# Patient Record
Sex: Female | Born: 1935
Health system: Southern US, Community
[De-identification: ages and names within clinical notes are randomized; demographics above are authoritative.]

## PROBLEM LIST (undated history)

## (undated) DIAGNOSIS — I509 Heart failure, unspecified: Secondary | ICD-10-CM

## (undated) DIAGNOSIS — E785 Hyperlipidemia, unspecified: Secondary | ICD-10-CM

## (undated) DIAGNOSIS — I639 Cerebral infarction, unspecified: Secondary | ICD-10-CM

## (undated) DIAGNOSIS — I4891 Unspecified atrial fibrillation: Secondary | ICD-10-CM

## (undated) DIAGNOSIS — I10 Essential (primary) hypertension: Secondary | ICD-10-CM

## (undated) HISTORY — DX: Unspecified atrial fibrillation: I48.91

## (undated) HISTORY — DX: Cerebral infarction, unspecified: I63.9

## (undated) HISTORY — PX: BLADDER SURGERY: SHX569

## (undated) HISTORY — DX: Essential (primary) hypertension: I10

## (undated) HISTORY — PX: THYROID SURGERY: SHX805

## (undated) HISTORY — DX: Hyperlipidemia, unspecified: E78.5

## (undated) HISTORY — PX: ABDOMINAL HYSTERECTOMY: SHX81

## (undated) HISTORY — DX: Heart failure, unspecified: I50.9

## (undated) HISTORY — PX: EYE SURGERY: SHX253

---

## 1998-08-31 ENCOUNTER — Ambulatory Visit (HOSPITAL_COMMUNITY): Admission: RE | Admit: 1998-08-31 | Discharge: 1998-08-31 | Payer: Self-pay | Admitting: Endocrinology

## 1998-09-04 ENCOUNTER — Ambulatory Visit (HOSPITAL_COMMUNITY): Admission: RE | Admit: 1998-09-04 | Discharge: 1998-09-04 | Payer: Self-pay | Admitting: Endocrinology

## 2004-12-07 ENCOUNTER — Encounter: Admission: RE | Admit: 2004-12-07 | Discharge: 2004-12-07 | Payer: Self-pay | Admitting: Orthopedic Surgery

## 2011-12-26 DIAGNOSIS — R350 Frequency of micturition: Secondary | ICD-10-CM | POA: Diagnosis not present

## 2011-12-26 DIAGNOSIS — N3020 Other chronic cystitis without hematuria: Secondary | ICD-10-CM | POA: Diagnosis not present

## 2011-12-26 DIAGNOSIS — R3129 Other microscopic hematuria: Secondary | ICD-10-CM | POA: Diagnosis not present

## 2011-12-26 DIAGNOSIS — N35919 Unspecified urethral stricture, male, unspecified site: Secondary | ICD-10-CM | POA: Diagnosis not present

## 2011-12-26 DIAGNOSIS — R339 Retention of urine, unspecified: Secondary | ICD-10-CM | POA: Diagnosis not present

## 2012-01-13 DIAGNOSIS — Z1231 Encounter for screening mammogram for malignant neoplasm of breast: Secondary | ICD-10-CM | POA: Diagnosis not present

## 2012-01-19 DIAGNOSIS — N63 Unspecified lump in unspecified breast: Secondary | ICD-10-CM | POA: Diagnosis not present

## 2012-02-27 DIAGNOSIS — I4891 Unspecified atrial fibrillation: Secondary | ICD-10-CM | POA: Diagnosis not present

## 2012-02-27 DIAGNOSIS — Z7901 Long term (current) use of anticoagulants: Secondary | ICD-10-CM | POA: Diagnosis not present

## 2012-02-27 DIAGNOSIS — I10 Essential (primary) hypertension: Secondary | ICD-10-CM | POA: Diagnosis not present

## 2012-02-28 DIAGNOSIS — Z79899 Other long term (current) drug therapy: Secondary | ICD-10-CM | POA: Diagnosis not present

## 2012-02-28 DIAGNOSIS — E785 Hyperlipidemia, unspecified: Secondary | ICD-10-CM | POA: Diagnosis not present

## 2012-02-28 DIAGNOSIS — IMO0002 Reserved for concepts with insufficient information to code with codable children: Secondary | ICD-10-CM | POA: Diagnosis not present

## 2012-02-28 DIAGNOSIS — I10 Essential (primary) hypertension: Secondary | ICD-10-CM | POA: Diagnosis not present

## 2012-02-28 DIAGNOSIS — E039 Hypothyroidism, unspecified: Secondary | ICD-10-CM | POA: Diagnosis not present

## 2012-02-28 DIAGNOSIS — I4891 Unspecified atrial fibrillation: Secondary | ICD-10-CM | POA: Diagnosis not present

## 2012-03-27 DIAGNOSIS — R350 Frequency of micturition: Secondary | ICD-10-CM | POA: Diagnosis not present

## 2012-03-27 DIAGNOSIS — R339 Retention of urine, unspecified: Secondary | ICD-10-CM | POA: Diagnosis not present

## 2012-03-27 DIAGNOSIS — N35919 Unspecified urethral stricture, male, unspecified site: Secondary | ICD-10-CM | POA: Diagnosis not present

## 2012-03-27 DIAGNOSIS — R3129 Other microscopic hematuria: Secondary | ICD-10-CM | POA: Diagnosis not present

## 2012-03-27 DIAGNOSIS — N3020 Other chronic cystitis without hematuria: Secondary | ICD-10-CM | POA: Diagnosis not present

## 2012-05-07 DIAGNOSIS — E0789 Other specified disorders of thyroid: Secondary | ICD-10-CM | POA: Diagnosis not present

## 2012-05-10 DIAGNOSIS — M79609 Pain in unspecified limb: Secondary | ICD-10-CM | POA: Diagnosis not present

## 2012-05-10 DIAGNOSIS — B351 Tinea unguium: Secondary | ICD-10-CM | POA: Diagnosis not present

## 2012-05-12 DIAGNOSIS — L2089 Other atopic dermatitis: Secondary | ICD-10-CM | POA: Diagnosis not present

## 2012-05-14 DIAGNOSIS — F411 Generalized anxiety disorder: Secondary | ICD-10-CM | POA: Diagnosis not present

## 2012-05-14 DIAGNOSIS — E0789 Other specified disorders of thyroid: Secondary | ICD-10-CM | POA: Diagnosis not present

## 2012-05-14 DIAGNOSIS — I10 Essential (primary) hypertension: Secondary | ICD-10-CM | POA: Diagnosis not present

## 2012-06-06 DIAGNOSIS — I4891 Unspecified atrial fibrillation: Secondary | ICD-10-CM | POA: Diagnosis not present

## 2012-06-06 DIAGNOSIS — E785 Hyperlipidemia, unspecified: Secondary | ICD-10-CM | POA: Diagnosis not present

## 2012-06-06 DIAGNOSIS — I10 Essential (primary) hypertension: Secondary | ICD-10-CM | POA: Diagnosis not present

## 2012-06-06 DIAGNOSIS — IMO0002 Reserved for concepts with insufficient information to code with codable children: Secondary | ICD-10-CM | POA: Diagnosis not present

## 2012-06-26 DIAGNOSIS — N35919 Unspecified urethral stricture, male, unspecified site: Secondary | ICD-10-CM | POA: Diagnosis not present

## 2012-06-26 DIAGNOSIS — R350 Frequency of micturition: Secondary | ICD-10-CM | POA: Diagnosis not present

## 2012-06-26 DIAGNOSIS — R339 Retention of urine, unspecified: Secondary | ICD-10-CM | POA: Diagnosis not present

## 2012-06-26 DIAGNOSIS — R3129 Other microscopic hematuria: Secondary | ICD-10-CM | POA: Diagnosis not present

## 2012-07-16 DIAGNOSIS — H52229 Regular astigmatism, unspecified eye: Secondary | ICD-10-CM | POA: Diagnosis not present

## 2012-07-16 DIAGNOSIS — IMO0002 Reserved for concepts with insufficient information to code with codable children: Secondary | ICD-10-CM | POA: Diagnosis not present

## 2012-07-16 DIAGNOSIS — H43399 Other vitreous opacities, unspecified eye: Secondary | ICD-10-CM | POA: Diagnosis not present

## 2012-07-16 DIAGNOSIS — H524 Presbyopia: Secondary | ICD-10-CM | POA: Diagnosis not present

## 2012-08-22 DIAGNOSIS — Z7901 Long term (current) use of anticoagulants: Secondary | ICD-10-CM | POA: Diagnosis not present

## 2012-08-22 DIAGNOSIS — I4891 Unspecified atrial fibrillation: Secondary | ICD-10-CM | POA: Diagnosis not present

## 2012-08-22 DIAGNOSIS — E890 Postprocedural hypothyroidism: Secondary | ICD-10-CM | POA: Diagnosis not present

## 2012-08-22 DIAGNOSIS — I10 Essential (primary) hypertension: Secondary | ICD-10-CM | POA: Diagnosis not present

## 2012-08-23 DIAGNOSIS — IMO0002 Reserved for concepts with insufficient information to code with codable children: Secondary | ICD-10-CM | POA: Diagnosis not present

## 2012-08-23 DIAGNOSIS — I10 Essential (primary) hypertension: Secondary | ICD-10-CM | POA: Diagnosis not present

## 2012-08-23 DIAGNOSIS — Z0000 Encounter for general adult medical examination without abnormal findings: Secondary | ICD-10-CM | POA: Diagnosis not present

## 2012-08-23 DIAGNOSIS — Z79899 Other long term (current) drug therapy: Secondary | ICD-10-CM | POA: Diagnosis not present

## 2012-08-23 DIAGNOSIS — Z23 Encounter for immunization: Secondary | ICD-10-CM | POA: Diagnosis not present

## 2012-08-23 DIAGNOSIS — E785 Hyperlipidemia, unspecified: Secondary | ICD-10-CM | POA: Diagnosis not present

## 2012-08-29 DIAGNOSIS — M810 Age-related osteoporosis without current pathological fracture: Secondary | ICD-10-CM | POA: Diagnosis not present

## 2012-09-19 DIAGNOSIS — M810 Age-related osteoporosis without current pathological fracture: Secondary | ICD-10-CM | POA: Diagnosis not present

## 2012-09-24 DIAGNOSIS — R3129 Other microscopic hematuria: Secondary | ICD-10-CM | POA: Diagnosis not present

## 2012-09-24 DIAGNOSIS — N35919 Unspecified urethral stricture, male, unspecified site: Secondary | ICD-10-CM | POA: Diagnosis not present

## 2012-09-24 DIAGNOSIS — R339 Retention of urine, unspecified: Secondary | ICD-10-CM | POA: Diagnosis not present

## 2012-09-26 DIAGNOSIS — Z23 Encounter for immunization: Secondary | ICD-10-CM | POA: Diagnosis not present

## 2012-10-02 DIAGNOSIS — N3020 Other chronic cystitis without hematuria: Secondary | ICD-10-CM | POA: Diagnosis not present

## 2012-10-02 DIAGNOSIS — N200 Calculus of kidney: Secondary | ICD-10-CM | POA: Diagnosis not present

## 2012-10-02 DIAGNOSIS — R3915 Urgency of urination: Secondary | ICD-10-CM | POA: Diagnosis not present

## 2012-10-02 DIAGNOSIS — R3989 Other symptoms and signs involving the genitourinary system: Secondary | ICD-10-CM | POA: Diagnosis not present

## 2012-10-02 DIAGNOSIS — N390 Urinary tract infection, site not specified: Secondary | ICD-10-CM | POA: Diagnosis not present

## 2012-10-02 DIAGNOSIS — N35919 Unspecified urethral stricture, male, unspecified site: Secondary | ICD-10-CM | POA: Diagnosis not present

## 2012-10-02 DIAGNOSIS — R3129 Other microscopic hematuria: Secondary | ICD-10-CM | POA: Diagnosis not present

## 2012-10-04 DIAGNOSIS — R339 Retention of urine, unspecified: Secondary | ICD-10-CM | POA: Diagnosis not present

## 2012-10-04 DIAGNOSIS — N3020 Other chronic cystitis without hematuria: Secondary | ICD-10-CM | POA: Diagnosis not present

## 2012-10-04 DIAGNOSIS — N35919 Unspecified urethral stricture, male, unspecified site: Secondary | ICD-10-CM | POA: Diagnosis not present

## 2012-10-04 DIAGNOSIS — R3915 Urgency of urination: Secondary | ICD-10-CM | POA: Diagnosis not present

## 2012-10-04 DIAGNOSIS — N949 Unspecified condition associated with female genital organs and menstrual cycle: Secondary | ICD-10-CM | POA: Diagnosis not present

## 2012-10-04 DIAGNOSIS — R3129 Other microscopic hematuria: Secondary | ICD-10-CM | POA: Diagnosis not present

## 2012-10-04 DIAGNOSIS — D3000 Benign neoplasm of unspecified kidney: Secondary | ICD-10-CM | POA: Diagnosis not present

## 2012-10-07 DIAGNOSIS — I4891 Unspecified atrial fibrillation: Secondary | ICD-10-CM | POA: Diagnosis not present

## 2012-10-07 DIAGNOSIS — K649 Unspecified hemorrhoids: Secondary | ICD-10-CM | POA: Diagnosis not present

## 2012-10-07 DIAGNOSIS — K922 Gastrointestinal hemorrhage, unspecified: Secondary | ICD-10-CM | POA: Diagnosis not present

## 2012-10-07 DIAGNOSIS — K625 Hemorrhage of anus and rectum: Secondary | ICD-10-CM | POA: Diagnosis not present

## 2012-10-09 DIAGNOSIS — K649 Unspecified hemorrhoids: Secondary | ICD-10-CM | POA: Diagnosis not present

## 2012-10-09 DIAGNOSIS — K921 Melena: Secondary | ICD-10-CM | POA: Diagnosis not present

## 2012-10-09 DIAGNOSIS — R3129 Other microscopic hematuria: Secondary | ICD-10-CM | POA: Diagnosis not present

## 2012-10-09 DIAGNOSIS — R339 Retention of urine, unspecified: Secondary | ICD-10-CM | POA: Diagnosis not present

## 2012-10-09 DIAGNOSIS — I4891 Unspecified atrial fibrillation: Secondary | ICD-10-CM | POA: Diagnosis not present

## 2012-10-09 DIAGNOSIS — K922 Gastrointestinal hemorrhage, unspecified: Secondary | ICD-10-CM | POA: Diagnosis not present

## 2012-10-09 DIAGNOSIS — N3020 Other chronic cystitis without hematuria: Secondary | ICD-10-CM | POA: Diagnosis not present

## 2012-10-10 DIAGNOSIS — K922 Gastrointestinal hemorrhage, unspecified: Secondary | ICD-10-CM | POA: Diagnosis not present

## 2012-10-12 DIAGNOSIS — L270 Generalized skin eruption due to drugs and medicaments taken internally: Secondary | ICD-10-CM | POA: Diagnosis not present

## 2012-10-12 DIAGNOSIS — IMO0002 Reserved for concepts with insufficient information to code with codable children: Secondary | ICD-10-CM | POA: Diagnosis not present

## 2012-10-18 DIAGNOSIS — K5730 Diverticulosis of large intestine without perforation or abscess without bleeding: Secondary | ICD-10-CM | POA: Diagnosis not present

## 2012-10-18 DIAGNOSIS — K921 Melena: Secondary | ICD-10-CM | POA: Diagnosis not present

## 2012-11-05 DIAGNOSIS — R3129 Other microscopic hematuria: Secondary | ICD-10-CM | POA: Diagnosis not present

## 2012-11-05 DIAGNOSIS — N35919 Unspecified urethral stricture, male, unspecified site: Secondary | ICD-10-CM | POA: Diagnosis not present

## 2012-11-05 DIAGNOSIS — N3020 Other chronic cystitis without hematuria: Secondary | ICD-10-CM | POA: Diagnosis not present

## 2012-11-05 DIAGNOSIS — R3915 Urgency of urination: Secondary | ICD-10-CM | POA: Diagnosis not present

## 2012-11-05 DIAGNOSIS — R339 Retention of urine, unspecified: Secondary | ICD-10-CM | POA: Diagnosis not present

## 2012-11-05 DIAGNOSIS — R350 Frequency of micturition: Secondary | ICD-10-CM | POA: Diagnosis not present

## 2012-11-06 DIAGNOSIS — E0789 Other specified disorders of thyroid: Secondary | ICD-10-CM | POA: Diagnosis not present

## 2012-11-14 DIAGNOSIS — E0789 Other specified disorders of thyroid: Secondary | ICD-10-CM | POA: Diagnosis not present

## 2012-11-14 DIAGNOSIS — M810 Age-related osteoporosis without current pathological fracture: Secondary | ICD-10-CM | POA: Diagnosis not present

## 2012-11-14 DIAGNOSIS — I4891 Unspecified atrial fibrillation: Secondary | ICD-10-CM | POA: Diagnosis not present

## 2012-11-22 DIAGNOSIS — R3129 Other microscopic hematuria: Secondary | ICD-10-CM | POA: Diagnosis not present

## 2012-11-22 DIAGNOSIS — N3020 Other chronic cystitis without hematuria: Secondary | ICD-10-CM | POA: Diagnosis not present

## 2012-11-22 DIAGNOSIS — R339 Retention of urine, unspecified: Secondary | ICD-10-CM | POA: Diagnosis not present

## 2012-12-24 DIAGNOSIS — R339 Retention of urine, unspecified: Secondary | ICD-10-CM | POA: Diagnosis not present

## 2012-12-24 DIAGNOSIS — R3915 Urgency of urination: Secondary | ICD-10-CM | POA: Diagnosis not present

## 2012-12-24 DIAGNOSIS — N35919 Unspecified urethral stricture, male, unspecified site: Secondary | ICD-10-CM | POA: Diagnosis not present

## 2012-12-24 DIAGNOSIS — R3129 Other microscopic hematuria: Secondary | ICD-10-CM | POA: Diagnosis not present

## 2013-01-24 DIAGNOSIS — R339 Retention of urine, unspecified: Secondary | ICD-10-CM | POA: Diagnosis not present

## 2013-01-24 DIAGNOSIS — N35919 Unspecified urethral stricture, male, unspecified site: Secondary | ICD-10-CM | POA: Diagnosis not present

## 2013-01-24 DIAGNOSIS — R3915 Urgency of urination: Secondary | ICD-10-CM | POA: Diagnosis not present

## 2013-01-24 DIAGNOSIS — N3020 Other chronic cystitis without hematuria: Secondary | ICD-10-CM | POA: Diagnosis not present

## 2013-01-24 DIAGNOSIS — R3129 Other microscopic hematuria: Secondary | ICD-10-CM | POA: Diagnosis not present

## 2013-01-29 DIAGNOSIS — Z1231 Encounter for screening mammogram for malignant neoplasm of breast: Secondary | ICD-10-CM | POA: Diagnosis not present

## 2013-02-26 DIAGNOSIS — I4891 Unspecified atrial fibrillation: Secondary | ICD-10-CM | POA: Diagnosis not present

## 2013-02-26 DIAGNOSIS — I10 Essential (primary) hypertension: Secondary | ICD-10-CM | POA: Diagnosis not present

## 2013-02-28 DIAGNOSIS — I4891 Unspecified atrial fibrillation: Secondary | ICD-10-CM | POA: Diagnosis not present

## 2013-02-28 DIAGNOSIS — Z79899 Other long term (current) drug therapy: Secondary | ICD-10-CM | POA: Diagnosis not present

## 2013-02-28 DIAGNOSIS — IMO0002 Reserved for concepts with insufficient information to code with codable children: Secondary | ICD-10-CM | POA: Diagnosis not present

## 2013-02-28 DIAGNOSIS — E785 Hyperlipidemia, unspecified: Secondary | ICD-10-CM | POA: Diagnosis not present

## 2013-02-28 DIAGNOSIS — I10 Essential (primary) hypertension: Secondary | ICD-10-CM | POA: Diagnosis not present

## 2013-03-20 DIAGNOSIS — M810 Age-related osteoporosis without current pathological fracture: Secondary | ICD-10-CM | POA: Diagnosis not present

## 2013-03-28 DIAGNOSIS — R3129 Other microscopic hematuria: Secondary | ICD-10-CM | POA: Diagnosis not present

## 2013-03-28 DIAGNOSIS — R3915 Urgency of urination: Secondary | ICD-10-CM | POA: Diagnosis not present

## 2013-03-28 DIAGNOSIS — N35919 Unspecified urethral stricture, male, unspecified site: Secondary | ICD-10-CM | POA: Diagnosis not present

## 2013-03-28 DIAGNOSIS — N3020 Other chronic cystitis without hematuria: Secondary | ICD-10-CM | POA: Diagnosis not present

## 2013-05-09 DIAGNOSIS — E785 Hyperlipidemia, unspecified: Secondary | ICD-10-CM | POA: Diagnosis not present

## 2013-05-13 DIAGNOSIS — E0789 Other specified disorders of thyroid: Secondary | ICD-10-CM | POA: Diagnosis not present

## 2013-05-20 DIAGNOSIS — I4891 Unspecified atrial fibrillation: Secondary | ICD-10-CM | POA: Diagnosis not present

## 2013-05-20 DIAGNOSIS — I10 Essential (primary) hypertension: Secondary | ICD-10-CM | POA: Diagnosis not present

## 2013-05-20 DIAGNOSIS — E0789 Other specified disorders of thyroid: Secondary | ICD-10-CM | POA: Diagnosis not present

## 2013-05-30 DIAGNOSIS — N35919 Unspecified urethral stricture, male, unspecified site: Secondary | ICD-10-CM | POA: Diagnosis not present

## 2013-07-29 DIAGNOSIS — M19039 Primary osteoarthritis, unspecified wrist: Secondary | ICD-10-CM | POA: Diagnosis not present

## 2013-07-30 DIAGNOSIS — R3129 Other microscopic hematuria: Secondary | ICD-10-CM | POA: Diagnosis not present

## 2013-07-30 DIAGNOSIS — N35919 Unspecified urethral stricture, male, unspecified site: Secondary | ICD-10-CM | POA: Diagnosis not present

## 2013-07-30 DIAGNOSIS — N3020 Other chronic cystitis without hematuria: Secondary | ICD-10-CM | POA: Diagnosis not present

## 2013-07-30 DIAGNOSIS — R3915 Urgency of urination: Secondary | ICD-10-CM | POA: Diagnosis not present

## 2013-07-30 DIAGNOSIS — R339 Retention of urine, unspecified: Secondary | ICD-10-CM | POA: Diagnosis not present

## 2013-08-13 DIAGNOSIS — I4891 Unspecified atrial fibrillation: Secondary | ICD-10-CM | POA: Diagnosis not present

## 2013-08-13 DIAGNOSIS — I10 Essential (primary) hypertension: Secondary | ICD-10-CM | POA: Diagnosis not present

## 2013-09-05 DIAGNOSIS — Z23 Encounter for immunization: Secondary | ICD-10-CM | POA: Diagnosis not present

## 2013-09-05 DIAGNOSIS — I10 Essential (primary) hypertension: Secondary | ICD-10-CM | POA: Diagnosis not present

## 2013-09-05 DIAGNOSIS — E785 Hyperlipidemia, unspecified: Secondary | ICD-10-CM | POA: Diagnosis not present

## 2013-09-05 DIAGNOSIS — I4891 Unspecified atrial fibrillation: Secondary | ICD-10-CM | POA: Diagnosis not present

## 2013-09-05 DIAGNOSIS — IMO0002 Reserved for concepts with insufficient information to code with codable children: Secondary | ICD-10-CM | POA: Diagnosis not present

## 2013-09-05 DIAGNOSIS — Z79899 Other long term (current) drug therapy: Secondary | ICD-10-CM | POA: Diagnosis not present

## 2013-09-19 DIAGNOSIS — H259 Unspecified age-related cataract: Secondary | ICD-10-CM | POA: Diagnosis not present

## 2013-09-19 DIAGNOSIS — I10 Essential (primary) hypertension: Secondary | ICD-10-CM | POA: Diagnosis not present

## 2013-09-23 DIAGNOSIS — M810 Age-related osteoporosis without current pathological fracture: Secondary | ICD-10-CM | POA: Diagnosis not present

## 2013-09-30 DIAGNOSIS — R3915 Urgency of urination: Secondary | ICD-10-CM | POA: Diagnosis not present

## 2013-09-30 DIAGNOSIS — N35919 Unspecified urethral stricture, male, unspecified site: Secondary | ICD-10-CM | POA: Diagnosis not present

## 2013-09-30 DIAGNOSIS — R339 Retention of urine, unspecified: Secondary | ICD-10-CM | POA: Diagnosis not present

## 2013-09-30 DIAGNOSIS — R350 Frequency of micturition: Secondary | ICD-10-CM | POA: Diagnosis not present

## 2013-11-12 DIAGNOSIS — E0789 Other specified disorders of thyroid: Secondary | ICD-10-CM | POA: Diagnosis not present

## 2013-11-19 DIAGNOSIS — I10 Essential (primary) hypertension: Secondary | ICD-10-CM | POA: Diagnosis not present

## 2013-11-19 DIAGNOSIS — I4891 Unspecified atrial fibrillation: Secondary | ICD-10-CM | POA: Diagnosis not present

## 2013-11-26 DIAGNOSIS — R350 Frequency of micturition: Secondary | ICD-10-CM | POA: Diagnosis not present

## 2013-11-26 DIAGNOSIS — N35919 Unspecified urethral stricture, male, unspecified site: Secondary | ICD-10-CM | POA: Diagnosis not present

## 2013-11-26 DIAGNOSIS — N3020 Other chronic cystitis without hematuria: Secondary | ICD-10-CM | POA: Diagnosis not present

## 2013-11-26 DIAGNOSIS — R339 Retention of urine, unspecified: Secondary | ICD-10-CM | POA: Diagnosis not present

## 2013-12-04 DIAGNOSIS — R6889 Other general symptoms and signs: Secondary | ICD-10-CM | POA: Diagnosis not present

## 2013-12-04 DIAGNOSIS — H6690 Otitis media, unspecified, unspecified ear: Secondary | ICD-10-CM | POA: Diagnosis not present

## 2013-12-13 DIAGNOSIS — IMO0002 Reserved for concepts with insufficient information to code with codable children: Secondary | ICD-10-CM | POA: Diagnosis not present

## 2013-12-13 DIAGNOSIS — J209 Acute bronchitis, unspecified: Secondary | ICD-10-CM | POA: Diagnosis not present

## 2013-12-13 DIAGNOSIS — E039 Hypothyroidism, unspecified: Secondary | ICD-10-CM | POA: Diagnosis not present

## 2013-12-13 DIAGNOSIS — I10 Essential (primary) hypertension: Secondary | ICD-10-CM | POA: Diagnosis not present

## 2014-01-27 DIAGNOSIS — N35919 Unspecified urethral stricture, male, unspecified site: Secondary | ICD-10-CM | POA: Diagnosis not present

## 2014-01-27 DIAGNOSIS — R339 Retention of urine, unspecified: Secondary | ICD-10-CM | POA: Diagnosis not present

## 2014-01-27 DIAGNOSIS — R3915 Urgency of urination: Secondary | ICD-10-CM | POA: Diagnosis not present

## 2014-01-27 DIAGNOSIS — R350 Frequency of micturition: Secondary | ICD-10-CM | POA: Diagnosis not present

## 2014-01-27 DIAGNOSIS — R3129 Other microscopic hematuria: Secondary | ICD-10-CM | POA: Diagnosis not present

## 2014-02-06 DIAGNOSIS — Z1231 Encounter for screening mammogram for malignant neoplasm of breast: Secondary | ICD-10-CM | POA: Diagnosis not present

## 2014-02-27 ENCOUNTER — Ambulatory Visit (INDEPENDENT_AMBULATORY_CARE_PROVIDER_SITE_OTHER): Payer: Medicare Other

## 2014-02-27 VITALS — BP 137/86 | HR 74 | Resp 16 | Ht 65.0 in | Wt 147.0 lb

## 2014-02-27 DIAGNOSIS — M79609 Pain in unspecified limb: Secondary | ICD-10-CM | POA: Diagnosis not present

## 2014-02-27 DIAGNOSIS — L600 Ingrowing nail: Secondary | ICD-10-CM | POA: Diagnosis not present

## 2014-02-27 DIAGNOSIS — B351 Tinea unguium: Secondary | ICD-10-CM

## 2014-02-27 MED ORDER — TAVABOROLE 5 % EX SOLN: CUTANEOUS | Status: DC

## 2014-02-27 NOTE — Progress Notes (Signed)
   Subjective:    Patient ID: Jean Ruiz, female    DOB: 24-May-1935, 79 y.o.   MRN: [id number]  HPI N toenail problem        L left 1st toenail        D and O 1 year          C thick, discolored and painful        A enclosed shoe, pressure        T topical antifungal medication      Review of Systems  All other systems reviewed and are negative.       Objective:   Physical Exam Neurovascular status is intact with pedal pulses palpable DP and PT posterior were for bilateral capillary refill time 3 seconds all digits skin temperature is warm turgor normal no edema rubor pallor or varicosities noted is pain tenderness distal nail plate nail fold area of the left great toe nails thick and dark yellow black and discolored with kyphosis discoloration consistent with onychomycosis patient has been treating in the past no megaly with topical Fungi-Nail although there is some proximal clearing no complete resolution is noted at this time. Patient is it should not alternative treatments or medications. Orthopedic biomechanical exam unremarkable noncontributory mild digital contractures noted no open wounds ulcerations no secondary infection is noted       Assessment & Plan:  Assessment onychomycosis painful criptotic incurvated in ingrowing mycotic nail left great toe plan at this time the nails debrided and the patient is issued a topical antifungal keratin prescription for did to pharmacy will be shifted patient how correctly. Apply daily as instructed for 12 months duration. Recheck in 3-6 months for reevaluation and possible debridement if needed next  Harriet Masson DPM

## 2014-02-27 NOTE — Patient Instructions (Signed)
Onychomycosis/Fungal Toenails  WHAT IS IT? An infection that lies within the keratin of your nail plate that is caused by a fungus.  WHY ME? Fungal infections affect all ages, sexes, races, and creeds.  There may be many factors that predispose you to a fungal infection such as age, coexisting medical conditions such as diabetes, or an autoimmune disease; stress, medications, fatigue, genetics, etc.  Bottom line: fungus thrives in a warm, moist environment and your shoes offer such a location.  IS IT CONTAGIOUS? Theoretically, yes.  You do not want to share shoes, nail clippers or files with someone who has fungal toenails.  Walking around barefoot in the same room or sleeping in the same bed is unlikely to transfer the organism.  It is important to realize, however, that fungus can spread easily from one nail to the next on the same foot.  HOW DO WE TREAT THIS?  There are several ways to treat this condition.  Treatment may depend on many factors such as age, medications, pregnancy, liver and kidney conditions, etc.  It is best to ask your doctor which options are available to you.  1. No treatment.   Unlike many other medical concerns, you can live with this condition.  However for many people this can be a painful condition and may lead to ingrown toenails or a bacterial infection.  It is recommended that you keep the nails cut short to help reduce the amount of fungal nail. 2. Topical treatment.  These range from herbal remedies to prescription strength nail lacquers.  About 40-50% effective, topicals require twice daily application for approximately 9 to 12 months or until an entirely new nail has grown out.  The most effective topicals are medical grade medications available through physicians offices. 3. Oral antifungal medications.  With an 80-90% cure rate, the most common oral medication requires 3 to 4 months of therapy and stays in your system for a year as the new nail grows out.  Oral  antifungal medications do require blood work to make sure it is a safe drug for you.  A liver function panel will be performed prior to starting the medication and after the first month of treatment.  It is important to have the blood work performed to avoid any harmful side effects.  In general, this medication safe but blood work is required. 4. Laser Therapy.  This treatment is performed by applying a specialized laser to the affected nail plate.  This therapy is noninvasive, fast, and non-painful.  It is not covered by insurance and is therefore, out of pocket.  The results have been very good with a 80-95% cure rate.  [facility] is the only practice in the area to offer this therapy. 5. Permanent Nail Avulsion.  Removing the entire nail so that a new nail will not grow back.  The prescription for keratin has been called in to [facility]. What she received the medication begin by applying to affected toenail once daily for 12 months duration. Keep the nail trimmed and smoothed or files down as needed in the interim followup in 3-6 months for nail check if needed

## 2014-03-11 DIAGNOSIS — I10 Essential (primary) hypertension: Secondary | ICD-10-CM | POA: Diagnosis not present

## 2014-03-11 DIAGNOSIS — Z79899 Other long term (current) drug therapy: Secondary | ICD-10-CM | POA: Diagnosis not present

## 2014-03-11 DIAGNOSIS — I4891 Unspecified atrial fibrillation: Secondary | ICD-10-CM | POA: Diagnosis not present

## 2014-03-11 DIAGNOSIS — Z6825 Body mass index (BMI) 25.0-25.9, adult: Secondary | ICD-10-CM | POA: Diagnosis not present

## 2014-03-11 DIAGNOSIS — E785 Hyperlipidemia, unspecified: Secondary | ICD-10-CM | POA: Diagnosis not present

## 2014-03-28 DIAGNOSIS — R339 Retention of urine, unspecified: Secondary | ICD-10-CM | POA: Diagnosis not present

## 2014-03-28 DIAGNOSIS — N35919 Unspecified urethral stricture, male, unspecified site: Secondary | ICD-10-CM | POA: Diagnosis not present

## 2014-04-23 DIAGNOSIS — M810 Age-related osteoporosis without current pathological fracture: Secondary | ICD-10-CM | POA: Diagnosis not present

## 2014-05-13 DIAGNOSIS — E0789 Other specified disorders of thyroid: Secondary | ICD-10-CM | POA: Diagnosis not present

## 2014-05-20 DIAGNOSIS — E0789 Other specified disorders of thyroid: Secondary | ICD-10-CM | POA: Diagnosis not present

## 2014-05-20 DIAGNOSIS — I4891 Unspecified atrial fibrillation: Secondary | ICD-10-CM | POA: Diagnosis not present

## 2014-05-20 DIAGNOSIS — I10 Essential (primary) hypertension: Secondary | ICD-10-CM | POA: Diagnosis not present

## 2014-06-05 DIAGNOSIS — N3289 Other specified disorders of bladder: Secondary | ICD-10-CM | POA: Diagnosis not present

## 2014-06-05 DIAGNOSIS — Z87440 Personal history of urinary (tract) infections: Secondary | ICD-10-CM | POA: Insufficient documentation

## 2014-06-05 DIAGNOSIS — N35919 Unspecified urethral stricture, male, unspecified site: Secondary | ICD-10-CM | POA: Diagnosis not present

## 2014-06-05 DIAGNOSIS — IMO0002 Reserved for concepts with insufficient information to code with codable children: Secondary | ICD-10-CM | POA: Insufficient documentation

## 2014-06-05 DIAGNOSIS — N3941 Urge incontinence: Secondary | ICD-10-CM | POA: Diagnosis not present

## 2014-06-18 DIAGNOSIS — L659 Nonscarring hair loss, unspecified: Secondary | ICD-10-CM | POA: Diagnosis not present

## 2014-06-24 DIAGNOSIS — E0789 Other specified disorders of thyroid: Secondary | ICD-10-CM | POA: Diagnosis not present

## 2014-06-24 DIAGNOSIS — L659 Nonscarring hair loss, unspecified: Secondary | ICD-10-CM | POA: Diagnosis not present

## 2014-06-30 DIAGNOSIS — L219 Seborrheic dermatitis, unspecified: Secondary | ICD-10-CM | POA: Diagnosis not present

## 2014-07-29 DIAGNOSIS — C44111 Basal cell carcinoma of skin of unspecified eyelid, including canthus: Secondary | ICD-10-CM | POA: Diagnosis not present

## 2014-07-30 DIAGNOSIS — M810 Age-related osteoporosis without current pathological fracture: Secondary | ICD-10-CM | POA: Insufficient documentation

## 2014-07-30 DIAGNOSIS — N3020 Other chronic cystitis without hematuria: Secondary | ICD-10-CM | POA: Insufficient documentation

## 2014-07-30 DIAGNOSIS — K921 Melena: Secondary | ICD-10-CM | POA: Insufficient documentation

## 2014-07-30 DIAGNOSIS — R3915 Urgency of urination: Secondary | ICD-10-CM | POA: Insufficient documentation

## 2014-07-30 DIAGNOSIS — R339 Retention of urine, unspecified: Secondary | ICD-10-CM | POA: Insufficient documentation

## 2014-07-30 DIAGNOSIS — I10 Essential (primary) hypertension: Secondary | ICD-10-CM | POA: Insufficient documentation

## 2014-07-30 DIAGNOSIS — N950 Postmenopausal bleeding: Secondary | ICD-10-CM | POA: Insufficient documentation

## 2014-07-30 DIAGNOSIS — I4891 Unspecified atrial fibrillation: Secondary | ICD-10-CM | POA: Insufficient documentation

## 2014-07-30 DIAGNOSIS — R3129 Other microscopic hematuria: Secondary | ICD-10-CM | POA: Insufficient documentation

## 2014-07-30 DIAGNOSIS — R350 Frequency of micturition: Secondary | ICD-10-CM | POA: Insufficient documentation

## 2014-07-30 DIAGNOSIS — E890 Postprocedural hypothyroidism: Secondary | ICD-10-CM | POA: Insufficient documentation

## 2014-08-20 DIAGNOSIS — S81009A Unspecified open wound, unspecified knee, initial encounter: Secondary | ICD-10-CM | POA: Diagnosis not present

## 2014-08-20 DIAGNOSIS — Z6825 Body mass index (BMI) 25.0-25.9, adult: Secondary | ICD-10-CM | POA: Diagnosis not present

## 2014-08-20 DIAGNOSIS — S81809A Unspecified open wound, unspecified lower leg, initial encounter: Secondary | ICD-10-CM | POA: Diagnosis not present

## 2014-08-28 DIAGNOSIS — R339 Retention of urine, unspecified: Secondary | ICD-10-CM | POA: Diagnosis not present

## 2014-08-28 DIAGNOSIS — N35919 Unspecified urethral stricture, male, unspecified site: Secondary | ICD-10-CM | POA: Diagnosis not present

## 2014-08-28 DIAGNOSIS — R3129 Other microscopic hematuria: Secondary | ICD-10-CM | POA: Diagnosis not present

## 2014-08-28 DIAGNOSIS — R3915 Urgency of urination: Secondary | ICD-10-CM | POA: Diagnosis not present

## 2014-09-16 DIAGNOSIS — E890 Postprocedural hypothyroidism: Secondary | ICD-10-CM | POA: Diagnosis not present

## 2014-09-16 DIAGNOSIS — Z6825 Body mass index (BMI) 25.0-25.9, adult: Secondary | ICD-10-CM | POA: Diagnosis not present

## 2014-09-16 DIAGNOSIS — M810 Age-related osteoporosis without current pathological fracture: Secondary | ICD-10-CM | POA: Diagnosis not present

## 2014-09-16 DIAGNOSIS — E785 Hyperlipidemia, unspecified: Secondary | ICD-10-CM | POA: Diagnosis not present

## 2014-09-16 DIAGNOSIS — I4891 Unspecified atrial fibrillation: Secondary | ICD-10-CM | POA: Diagnosis not present

## 2014-09-16 DIAGNOSIS — Z23 Encounter for immunization: Secondary | ICD-10-CM | POA: Diagnosis not present

## 2014-09-16 DIAGNOSIS — Z79899 Other long term (current) drug therapy: Secondary | ICD-10-CM | POA: Diagnosis not present

## 2014-09-16 DIAGNOSIS — I10 Essential (primary) hypertension: Secondary | ICD-10-CM | POA: Diagnosis not present

## 2014-09-30 DIAGNOSIS — L821 Other seborrheic keratosis: Secondary | ICD-10-CM | POA: Diagnosis not present

## 2014-09-30 DIAGNOSIS — L638 Other alopecia areata: Secondary | ICD-10-CM | POA: Diagnosis not present

## 2014-10-22 DIAGNOSIS — M8589 Other specified disorders of bone density and structure, multiple sites: Secondary | ICD-10-CM | POA: Diagnosis not present

## 2014-10-22 DIAGNOSIS — M8580 Other specified disorders of bone density and structure, unspecified site: Secondary | ICD-10-CM | POA: Diagnosis not present

## 2014-10-22 DIAGNOSIS — Z13820 Encounter for screening for osteoporosis: Secondary | ICD-10-CM | POA: Diagnosis not present

## 2014-10-28 DIAGNOSIS — H5212 Myopia, left eye: Secondary | ICD-10-CM | POA: Diagnosis not present

## 2014-10-28 DIAGNOSIS — H43813 Vitreous degeneration, bilateral: Secondary | ICD-10-CM | POA: Diagnosis not present

## 2014-10-28 DIAGNOSIS — H52222 Regular astigmatism, left eye: Secondary | ICD-10-CM | POA: Diagnosis not present

## 2014-10-28 DIAGNOSIS — I10 Essential (primary) hypertension: Secondary | ICD-10-CM | POA: Diagnosis not present

## 2014-10-28 DIAGNOSIS — H524 Presbyopia: Secondary | ICD-10-CM | POA: Diagnosis not present

## 2014-10-28 DIAGNOSIS — H25813 Combined forms of age-related cataract, bilateral: Secondary | ICD-10-CM | POA: Diagnosis not present

## 2014-10-28 DIAGNOSIS — H52221 Regular astigmatism, right eye: Secondary | ICD-10-CM | POA: Diagnosis not present

## 2014-11-11 DIAGNOSIS — E0789 Other specified disorders of thyroid: Secondary | ICD-10-CM | POA: Diagnosis not present

## 2014-11-12 DIAGNOSIS — M79604 Pain in right leg: Secondary | ICD-10-CM | POA: Diagnosis not present

## 2014-11-12 DIAGNOSIS — M1712 Unilateral primary osteoarthritis, left knee: Secondary | ICD-10-CM | POA: Diagnosis not present

## 2014-11-12 DIAGNOSIS — M79671 Pain in right foot: Secondary | ICD-10-CM | POA: Diagnosis not present

## 2014-11-12 DIAGNOSIS — M19072 Primary osteoarthritis, left ankle and foot: Secondary | ICD-10-CM | POA: Diagnosis not present

## 2014-11-12 DIAGNOSIS — M545 Low back pain: Secondary | ICD-10-CM | POA: Diagnosis not present

## 2014-11-12 DIAGNOSIS — M25561 Pain in right knee: Secondary | ICD-10-CM | POA: Diagnosis not present

## 2014-11-12 DIAGNOSIS — M25551 Pain in right hip: Secondary | ICD-10-CM | POA: Diagnosis not present

## 2014-11-12 DIAGNOSIS — M5136 Other intervertebral disc degeneration, lumbar region: Secondary | ICD-10-CM | POA: Diagnosis not present

## 2014-11-12 DIAGNOSIS — M1711 Unilateral primary osteoarthritis, right knee: Secondary | ICD-10-CM | POA: Diagnosis not present

## 2014-11-12 DIAGNOSIS — M79672 Pain in left foot: Secondary | ICD-10-CM | POA: Diagnosis not present

## 2014-11-12 DIAGNOSIS — M160 Bilateral primary osteoarthritis of hip: Secondary | ICD-10-CM | POA: Diagnosis not present

## 2014-11-12 DIAGNOSIS — Z6826 Body mass index (BMI) 26.0-26.9, adult: Secondary | ICD-10-CM | POA: Diagnosis not present

## 2014-11-18 DIAGNOSIS — E039 Hypothyroidism, unspecified: Secondary | ICD-10-CM | POA: Diagnosis not present

## 2014-11-25 DIAGNOSIS — M47816 Spondylosis without myelopathy or radiculopathy, lumbar region: Secondary | ICD-10-CM | POA: Diagnosis not present

## 2014-11-25 DIAGNOSIS — S32000A Wedge compression fracture of unspecified lumbar vertebra, initial encounter for closed fracture: Secondary | ICD-10-CM | POA: Diagnosis not present

## 2014-11-25 DIAGNOSIS — N359 Urethral stricture, unspecified: Secondary | ICD-10-CM | POA: Diagnosis not present

## 2014-11-25 DIAGNOSIS — N3020 Other chronic cystitis without hematuria: Secondary | ICD-10-CM | POA: Diagnosis not present

## 2014-11-25 DIAGNOSIS — R339 Retention of urine, unspecified: Secondary | ICD-10-CM | POA: Diagnosis not present

## 2014-11-25 DIAGNOSIS — R3915 Urgency of urination: Secondary | ICD-10-CM | POA: Diagnosis not present

## 2014-11-25 DIAGNOSIS — M5126 Other intervertebral disc displacement, lumbar region: Secondary | ICD-10-CM | POA: Diagnosis not present

## 2014-11-25 DIAGNOSIS — M5136 Other intervertebral disc degeneration, lumbar region: Secondary | ICD-10-CM | POA: Diagnosis not present

## 2015-01-16 DIAGNOSIS — Z6825 Body mass index (BMI) 25.0-25.9, adult: Secondary | ICD-10-CM | POA: Diagnosis not present

## 2015-01-16 DIAGNOSIS — J019 Acute sinusitis, unspecified: Secondary | ICD-10-CM | POA: Diagnosis not present

## 2015-02-10 DIAGNOSIS — Z1231 Encounter for screening mammogram for malignant neoplasm of breast: Secondary | ICD-10-CM | POA: Diagnosis not present

## 2015-02-23 DIAGNOSIS — N359 Urethral stricture, unspecified: Secondary | ICD-10-CM | POA: Diagnosis not present

## 2015-02-23 DIAGNOSIS — R339 Retention of urine, unspecified: Secondary | ICD-10-CM | POA: Diagnosis not present

## 2015-02-23 DIAGNOSIS — R312 Other microscopic hematuria: Secondary | ICD-10-CM | POA: Diagnosis not present

## 2015-02-23 DIAGNOSIS — N3020 Other chronic cystitis without hematuria: Secondary | ICD-10-CM | POA: Diagnosis not present

## 2015-02-23 DIAGNOSIS — R350 Frequency of micturition: Secondary | ICD-10-CM | POA: Diagnosis not present

## 2015-03-05 DIAGNOSIS — L820 Inflamed seborrheic keratosis: Secondary | ICD-10-CM | POA: Diagnosis not present

## 2015-03-05 DIAGNOSIS — L821 Other seborrheic keratosis: Secondary | ICD-10-CM | POA: Diagnosis not present

## 2015-03-05 DIAGNOSIS — L728 Other follicular cysts of the skin and subcutaneous tissue: Secondary | ICD-10-CM | POA: Diagnosis not present

## 2015-03-05 DIAGNOSIS — C44319 Basal cell carcinoma of skin of other parts of face: Secondary | ICD-10-CM | POA: Diagnosis not present

## 2015-03-23 DIAGNOSIS — Z0000 Encounter for general adult medical examination without abnormal findings: Secondary | ICD-10-CM | POA: Diagnosis not present

## 2015-03-24 DIAGNOSIS — I4891 Unspecified atrial fibrillation: Secondary | ICD-10-CM | POA: Diagnosis not present

## 2015-03-24 DIAGNOSIS — M810 Age-related osteoporosis without current pathological fracture: Secondary | ICD-10-CM | POA: Diagnosis not present

## 2015-03-24 DIAGNOSIS — I10 Essential (primary) hypertension: Secondary | ICD-10-CM | POA: Diagnosis not present

## 2015-03-24 DIAGNOSIS — Z79899 Other long term (current) drug therapy: Secondary | ICD-10-CM | POA: Diagnosis not present

## 2015-03-24 DIAGNOSIS — Z23 Encounter for immunization: Secondary | ICD-10-CM | POA: Diagnosis not present

## 2015-03-24 DIAGNOSIS — Z760 Encounter for issue of repeat prescription: Secondary | ICD-10-CM | POA: Diagnosis not present

## 2015-03-24 DIAGNOSIS — E890 Postprocedural hypothyroidism: Secondary | ICD-10-CM | POA: Diagnosis not present

## 2015-03-24 DIAGNOSIS — Z6825 Body mass index (BMI) 25.0-25.9, adult: Secondary | ICD-10-CM | POA: Diagnosis not present

## 2015-03-24 DIAGNOSIS — E785 Hyperlipidemia, unspecified: Secondary | ICD-10-CM | POA: Diagnosis not present

## 2015-04-18 DIAGNOSIS — J019 Acute sinusitis, unspecified: Secondary | ICD-10-CM | POA: Diagnosis not present

## 2015-04-18 DIAGNOSIS — J309 Allergic rhinitis, unspecified: Secondary | ICD-10-CM | POA: Diagnosis not present

## 2015-04-20 DIAGNOSIS — I10 Essential (primary) hypertension: Secondary | ICD-10-CM | POA: Diagnosis not present

## 2015-04-20 DIAGNOSIS — R52 Pain, unspecified: Secondary | ICD-10-CM | POA: Diagnosis not present

## 2015-04-20 DIAGNOSIS — Z79899 Other long term (current) drug therapy: Secondary | ICD-10-CM | POA: Diagnosis not present

## 2015-04-20 DIAGNOSIS — S43109A Unspecified dislocation of unspecified acromioclavicular joint, initial encounter: Secondary | ICD-10-CM | POA: Diagnosis not present

## 2015-04-20 DIAGNOSIS — M19012 Primary osteoarthritis, left shoulder: Secondary | ICD-10-CM | POA: Diagnosis not present

## 2015-04-20 DIAGNOSIS — S4992XA Unspecified injury of left shoulder and upper arm, initial encounter: Secondary | ICD-10-CM | POA: Diagnosis not present

## 2015-04-20 DIAGNOSIS — R509 Fever, unspecified: Secondary | ICD-10-CM | POA: Diagnosis not present

## 2015-04-20 DIAGNOSIS — M25512 Pain in left shoulder: Secondary | ICD-10-CM | POA: Diagnosis not present

## 2015-04-25 DIAGNOSIS — M25512 Pain in left shoulder: Secondary | ICD-10-CM | POA: Diagnosis not present

## 2015-04-25 DIAGNOSIS — J019 Acute sinusitis, unspecified: Secondary | ICD-10-CM | POA: Diagnosis not present

## 2015-04-25 DIAGNOSIS — Z6825 Body mass index (BMI) 25.0-25.9, adult: Secondary | ICD-10-CM | POA: Diagnosis not present

## 2015-04-26 DIAGNOSIS — R0602 Shortness of breath: Secondary | ICD-10-CM | POA: Diagnosis not present

## 2015-04-26 DIAGNOSIS — R05 Cough: Secondary | ICD-10-CM | POA: Diagnosis not present

## 2015-04-26 DIAGNOSIS — K625 Hemorrhage of anus and rectum: Secondary | ICD-10-CM | POA: Diagnosis not present

## 2015-05-12 DIAGNOSIS — E0789 Other specified disorders of thyroid: Secondary | ICD-10-CM | POA: Diagnosis not present

## 2015-05-19 DIAGNOSIS — I10 Essential (primary) hypertension: Secondary | ICD-10-CM | POA: Diagnosis not present

## 2015-05-19 DIAGNOSIS — I482 Chronic atrial fibrillation: Secondary | ICD-10-CM | POA: Diagnosis not present

## 2015-05-19 DIAGNOSIS — E032 Hypothyroidism due to medicaments and other exogenous substances: Secondary | ICD-10-CM | POA: Diagnosis not present

## 2015-05-28 DIAGNOSIS — R350 Frequency of micturition: Secondary | ICD-10-CM | POA: Diagnosis not present

## 2015-05-28 DIAGNOSIS — R339 Retention of urine, unspecified: Secondary | ICD-10-CM | POA: Diagnosis not present

## 2015-05-28 DIAGNOSIS — Z87440 Personal history of urinary (tract) infections: Secondary | ICD-10-CM | POA: Diagnosis not present

## 2015-05-28 DIAGNOSIS — N359 Urethral stricture, unspecified: Secondary | ICD-10-CM | POA: Diagnosis not present

## 2015-06-15 DIAGNOSIS — M25512 Pain in left shoulder: Secondary | ICD-10-CM | POA: Diagnosis not present

## 2015-07-29 DIAGNOSIS — I872 Venous insufficiency (chronic) (peripheral): Secondary | ICD-10-CM | POA: Diagnosis not present

## 2015-07-29 DIAGNOSIS — R600 Localized edema: Secondary | ICD-10-CM | POA: Diagnosis not present

## 2015-07-29 DIAGNOSIS — I10 Essential (primary) hypertension: Secondary | ICD-10-CM | POA: Diagnosis not present

## 2015-07-29 DIAGNOSIS — Z6825 Body mass index (BMI) 25.0-25.9, adult: Secondary | ICD-10-CM | POA: Diagnosis not present

## 2015-08-14 DIAGNOSIS — Z87440 Personal history of urinary (tract) infections: Secondary | ICD-10-CM | POA: Diagnosis not present

## 2015-08-14 DIAGNOSIS — N359 Urethral stricture, unspecified: Secondary | ICD-10-CM | POA: Diagnosis not present

## 2015-08-14 DIAGNOSIS — N3020 Other chronic cystitis without hematuria: Secondary | ICD-10-CM | POA: Diagnosis not present

## 2015-08-14 DIAGNOSIS — N3289 Other specified disorders of bladder: Secondary | ICD-10-CM | POA: Diagnosis not present

## 2015-08-14 DIAGNOSIS — R339 Retention of urine, unspecified: Secondary | ICD-10-CM | POA: Diagnosis not present

## 2015-09-15 DIAGNOSIS — I4891 Unspecified atrial fibrillation: Secondary | ICD-10-CM | POA: Diagnosis not present

## 2015-09-15 DIAGNOSIS — E785 Hyperlipidemia, unspecified: Secondary | ICD-10-CM | POA: Diagnosis not present

## 2015-09-15 DIAGNOSIS — Z79899 Other long term (current) drug therapy: Secondary | ICD-10-CM | POA: Diagnosis not present

## 2015-09-15 DIAGNOSIS — E890 Postprocedural hypothyroidism: Secondary | ICD-10-CM | POA: Diagnosis not present

## 2015-09-17 DIAGNOSIS — Z23 Encounter for immunization: Secondary | ICD-10-CM | POA: Diagnosis not present

## 2015-09-17 DIAGNOSIS — I4891 Unspecified atrial fibrillation: Secondary | ICD-10-CM | POA: Diagnosis not present

## 2015-09-17 DIAGNOSIS — E890 Postprocedural hypothyroidism: Secondary | ICD-10-CM | POA: Diagnosis not present

## 2015-09-17 DIAGNOSIS — Z1389 Encounter for screening for other disorder: Secondary | ICD-10-CM | POA: Diagnosis not present

## 2015-09-17 DIAGNOSIS — M810 Age-related osteoporosis without current pathological fracture: Secondary | ICD-10-CM | POA: Diagnosis not present

## 2015-09-17 DIAGNOSIS — M25552 Pain in left hip: Secondary | ICD-10-CM | POA: Diagnosis not present

## 2015-09-17 DIAGNOSIS — M545 Low back pain: Secondary | ICD-10-CM | POA: Diagnosis not present

## 2015-09-17 DIAGNOSIS — I10 Essential (primary) hypertension: Secondary | ICD-10-CM | POA: Diagnosis not present

## 2015-09-17 DIAGNOSIS — Z6825 Body mass index (BMI) 25.0-25.9, adult: Secondary | ICD-10-CM | POA: Diagnosis not present

## 2015-09-17 DIAGNOSIS — Z9181 History of falling: Secondary | ICD-10-CM | POA: Diagnosis not present

## 2015-09-17 DIAGNOSIS — E785 Hyperlipidemia, unspecified: Secondary | ICD-10-CM | POA: Diagnosis not present

## 2015-09-17 DIAGNOSIS — M1612 Unilateral primary osteoarthritis, left hip: Secondary | ICD-10-CM | POA: Diagnosis not present

## 2015-09-21 DIAGNOSIS — E0789 Other specified disorders of thyroid: Secondary | ICD-10-CM | POA: Diagnosis not present

## 2015-09-24 DIAGNOSIS — E039 Hypothyroidism, unspecified: Secondary | ICD-10-CM | POA: Diagnosis not present

## 2015-09-24 DIAGNOSIS — M549 Dorsalgia, unspecified: Secondary | ICD-10-CM | POA: Diagnosis not present

## 2015-09-24 DIAGNOSIS — T887XXA Unspecified adverse effect of drug or medicament, initial encounter: Secondary | ICD-10-CM | POA: Diagnosis not present

## 2015-10-10 DIAGNOSIS — N390 Urinary tract infection, site not specified: Secondary | ICD-10-CM | POA: Diagnosis not present

## 2015-10-10 DIAGNOSIS — Z6825 Body mass index (BMI) 25.0-25.9, adult: Secondary | ICD-10-CM | POA: Diagnosis not present

## 2015-10-27 DIAGNOSIS — Z6824 Body mass index (BMI) 24.0-24.9, adult: Secondary | ICD-10-CM | POA: Diagnosis not present

## 2015-10-27 DIAGNOSIS — N390 Urinary tract infection, site not specified: Secondary | ICD-10-CM | POA: Diagnosis not present

## 2015-10-27 DIAGNOSIS — J019 Acute sinusitis, unspecified: Secondary | ICD-10-CM | POA: Diagnosis not present

## 2015-10-28 DIAGNOSIS — E0789 Other specified disorders of thyroid: Secondary | ICD-10-CM | POA: Diagnosis not present

## 2015-11-06 DIAGNOSIS — I10 Essential (primary) hypertension: Secondary | ICD-10-CM | POA: Diagnosis not present

## 2015-11-06 DIAGNOSIS — I482 Chronic atrial fibrillation: Secondary | ICD-10-CM | POA: Diagnosis not present

## 2015-11-06 DIAGNOSIS — E039 Hypothyroidism, unspecified: Secondary | ICD-10-CM | POA: Diagnosis not present

## 2015-11-13 DIAGNOSIS — I10 Essential (primary) hypertension: Secondary | ICD-10-CM | POA: Diagnosis not present

## 2015-11-17 DIAGNOSIS — R399 Unspecified symptoms and signs involving the genitourinary system: Secondary | ICD-10-CM | POA: Diagnosis not present

## 2015-11-17 DIAGNOSIS — I4891 Unspecified atrial fibrillation: Secondary | ICD-10-CM | POA: Diagnosis not present

## 2015-11-17 DIAGNOSIS — R311 Benign essential microscopic hematuria: Secondary | ICD-10-CM | POA: Diagnosis not present

## 2015-11-17 DIAGNOSIS — Z6825 Body mass index (BMI) 25.0-25.9, adult: Secondary | ICD-10-CM | POA: Diagnosis not present

## 2015-11-17 DIAGNOSIS — K644 Residual hemorrhoidal skin tags: Secondary | ICD-10-CM | POA: Diagnosis not present

## 2015-11-18 DIAGNOSIS — H25813 Combined forms of age-related cataract, bilateral: Secondary | ICD-10-CM | POA: Diagnosis not present

## 2015-11-18 DIAGNOSIS — H43393 Other vitreous opacities, bilateral: Secondary | ICD-10-CM | POA: Diagnosis not present

## 2015-11-18 DIAGNOSIS — H35373 Puckering of macula, bilateral: Secondary | ICD-10-CM | POA: Diagnosis not present

## 2015-11-18 DIAGNOSIS — H524 Presbyopia: Secondary | ICD-10-CM | POA: Diagnosis not present

## 2015-11-18 DIAGNOSIS — H43813 Vitreous degeneration, bilateral: Secondary | ICD-10-CM | POA: Diagnosis not present

## 2015-11-18 DIAGNOSIS — H5213 Myopia, bilateral: Secondary | ICD-10-CM | POA: Diagnosis not present

## 2015-11-18 DIAGNOSIS — H52223 Regular astigmatism, bilateral: Secondary | ICD-10-CM | POA: Diagnosis not present

## 2015-11-18 DIAGNOSIS — I10 Essential (primary) hypertension: Secondary | ICD-10-CM | POA: Diagnosis not present

## 2015-11-19 DIAGNOSIS — R339 Retention of urine, unspecified: Secondary | ICD-10-CM | POA: Diagnosis not present

## 2015-11-19 DIAGNOSIS — N359 Urethral stricture, unspecified: Secondary | ICD-10-CM | POA: Diagnosis not present

## 2015-11-19 DIAGNOSIS — Z87440 Personal history of urinary (tract) infections: Secondary | ICD-10-CM | POA: Diagnosis not present

## 2015-11-19 DIAGNOSIS — N3289 Other specified disorders of bladder: Secondary | ICD-10-CM | POA: Diagnosis not present

## 2015-11-19 DIAGNOSIS — R3915 Urgency of urination: Secondary | ICD-10-CM | POA: Diagnosis not present

## 2016-01-11 DIAGNOSIS — J069 Acute upper respiratory infection, unspecified: Secondary | ICD-10-CM | POA: Diagnosis not present

## 2016-01-11 DIAGNOSIS — Z6824 Body mass index (BMI) 24.0-24.9, adult: Secondary | ICD-10-CM | POA: Diagnosis not present

## 2016-01-21 DIAGNOSIS — M8008XA Age-related osteoporosis with current pathological fracture, vertebra(e), initial encounter for fracture: Secondary | ICD-10-CM | POA: Diagnosis not present

## 2016-01-21 DIAGNOSIS — M4726 Other spondylosis with radiculopathy, lumbar region: Secondary | ICD-10-CM | POA: Diagnosis not present

## 2016-01-21 DIAGNOSIS — M4316 Spondylolisthesis, lumbar region: Secondary | ICD-10-CM | POA: Diagnosis not present

## 2016-01-21 DIAGNOSIS — M5432 Sciatica, left side: Secondary | ICD-10-CM | POA: Diagnosis not present

## 2016-01-27 DIAGNOSIS — M545 Low back pain: Secondary | ICD-10-CM | POA: Diagnosis not present

## 2016-01-27 DIAGNOSIS — M5126 Other intervertebral disc displacement, lumbar region: Secondary | ICD-10-CM | POA: Diagnosis not present

## 2016-01-27 DIAGNOSIS — M4316 Spondylolisthesis, lumbar region: Secondary | ICD-10-CM | POA: Diagnosis not present

## 2016-02-09 DIAGNOSIS — M8008XA Age-related osteoporosis with current pathological fracture, vertebra(e), initial encounter for fracture: Secondary | ICD-10-CM | POA: Diagnosis not present

## 2016-02-09 DIAGNOSIS — M4726 Other spondylosis with radiculopathy, lumbar region: Secondary | ICD-10-CM | POA: Diagnosis not present

## 2016-02-10 DIAGNOSIS — Z6824 Body mass index (BMI) 24.0-24.9, adult: Secondary | ICD-10-CM | POA: Diagnosis not present

## 2016-02-10 DIAGNOSIS — I10 Essential (primary) hypertension: Secondary | ICD-10-CM | POA: Diagnosis not present

## 2016-02-10 DIAGNOSIS — I4891 Unspecified atrial fibrillation: Secondary | ICD-10-CM | POA: Diagnosis not present

## 2016-02-11 DIAGNOSIS — Z1231 Encounter for screening mammogram for malignant neoplasm of breast: Secondary | ICD-10-CM | POA: Diagnosis not present

## 2016-02-19 DIAGNOSIS — M4726 Other spondylosis with radiculopathy, lumbar region: Secondary | ICD-10-CM | POA: Diagnosis not present

## 2016-02-19 DIAGNOSIS — M5416 Radiculopathy, lumbar region: Secondary | ICD-10-CM | POA: Diagnosis not present

## 2016-02-19 DIAGNOSIS — M4316 Spondylolisthesis, lumbar region: Secondary | ICD-10-CM | POA: Diagnosis not present

## 2016-02-22 DIAGNOSIS — I4891 Unspecified atrial fibrillation: Secondary | ICD-10-CM | POA: Diagnosis not present

## 2016-02-22 DIAGNOSIS — I10 Essential (primary) hypertension: Secondary | ICD-10-CM | POA: Diagnosis not present

## 2016-02-25 DIAGNOSIS — S8012XA Contusion of left lower leg, initial encounter: Secondary | ICD-10-CM | POA: Diagnosis not present

## 2016-02-25 DIAGNOSIS — M1712 Unilateral primary osteoarthritis, left knee: Secondary | ICD-10-CM | POA: Diagnosis not present

## 2016-02-25 DIAGNOSIS — M47816 Spondylosis without myelopathy or radiculopathy, lumbar region: Secondary | ICD-10-CM | POA: Diagnosis not present

## 2016-02-25 DIAGNOSIS — G319 Degenerative disease of nervous system, unspecified: Secondary | ICD-10-CM | POA: Diagnosis not present

## 2016-02-25 DIAGNOSIS — Z79899 Other long term (current) drug therapy: Secondary | ICD-10-CM | POA: Diagnosis not present

## 2016-02-25 DIAGNOSIS — I4891 Unspecified atrial fibrillation: Secondary | ICD-10-CM | POA: Diagnosis not present

## 2016-02-25 DIAGNOSIS — Z87440 Personal history of urinary (tract) infections: Secondary | ICD-10-CM | POA: Diagnosis not present

## 2016-02-25 DIAGNOSIS — E079 Disorder of thyroid, unspecified: Secondary | ICD-10-CM | POA: Diagnosis not present

## 2016-02-25 DIAGNOSIS — S79912A Unspecified injury of left hip, initial encounter: Secondary | ICD-10-CM | POA: Diagnosis not present

## 2016-02-25 DIAGNOSIS — I10 Essential (primary) hypertension: Secondary | ICD-10-CM | POA: Diagnosis not present

## 2016-02-25 DIAGNOSIS — R319 Hematuria, unspecified: Secondary | ICD-10-CM | POA: Diagnosis not present

## 2016-02-25 DIAGNOSIS — R1084 Generalized abdominal pain: Secondary | ICD-10-CM | POA: Diagnosis not present

## 2016-02-25 DIAGNOSIS — M5184 Other intervertebral disc disorders, thoracic region: Secondary | ICD-10-CM | POA: Diagnosis not present

## 2016-02-25 DIAGNOSIS — M1612 Unilateral primary osteoarthritis, left hip: Secondary | ICD-10-CM | POA: Diagnosis not present

## 2016-02-25 DIAGNOSIS — S7012XA Contusion of left thigh, initial encounter: Secondary | ICD-10-CM | POA: Diagnosis not present

## 2016-02-25 DIAGNOSIS — R1032 Left lower quadrant pain: Secondary | ICD-10-CM | POA: Diagnosis not present

## 2016-02-25 DIAGNOSIS — M7989 Other specified soft tissue disorders: Secondary | ICD-10-CM | POA: Diagnosis not present

## 2016-02-25 DIAGNOSIS — M79605 Pain in left leg: Secondary | ICD-10-CM | POA: Diagnosis not present

## 2016-02-25 DIAGNOSIS — R259 Unspecified abnormal involuntary movements: Secondary | ICD-10-CM | POA: Diagnosis not present

## 2016-02-25 DIAGNOSIS — S39012A Strain of muscle, fascia and tendon of lower back, initial encounter: Secondary | ICD-10-CM | POA: Diagnosis not present

## 2016-02-25 DIAGNOSIS — S0990XA Unspecified injury of head, initial encounter: Secondary | ICD-10-CM | POA: Diagnosis not present

## 2016-02-25 DIAGNOSIS — S7002XA Contusion of left hip, initial encounter: Secondary | ICD-10-CM | POA: Diagnosis not present

## 2016-02-25 DIAGNOSIS — M545 Low back pain: Secondary | ICD-10-CM | POA: Diagnosis not present

## 2016-02-26 DIAGNOSIS — S79912A Unspecified injury of left hip, initial encounter: Secondary | ICD-10-CM | POA: Diagnosis not present

## 2016-02-26 DIAGNOSIS — M7989 Other specified soft tissue disorders: Secondary | ICD-10-CM | POA: Diagnosis not present

## 2016-02-26 DIAGNOSIS — S0990XA Unspecified injury of head, initial encounter: Secondary | ICD-10-CM | POA: Diagnosis not present

## 2016-02-26 DIAGNOSIS — M47816 Spondylosis without myelopathy or radiculopathy, lumbar region: Secondary | ICD-10-CM | POA: Diagnosis not present

## 2016-03-01 DIAGNOSIS — M4316 Spondylolisthesis, lumbar region: Secondary | ICD-10-CM | POA: Diagnosis not present

## 2016-03-01 DIAGNOSIS — M5416 Radiculopathy, lumbar region: Secondary | ICD-10-CM | POA: Diagnosis not present

## 2016-03-01 DIAGNOSIS — M791 Myalgia: Secondary | ICD-10-CM | POA: Diagnosis not present

## 2016-03-01 DIAGNOSIS — M4726 Other spondylosis with radiculopathy, lumbar region: Secondary | ICD-10-CM | POA: Diagnosis not present

## 2016-03-09 DIAGNOSIS — I481 Persistent atrial fibrillation: Secondary | ICD-10-CM | POA: Diagnosis not present

## 2016-03-09 DIAGNOSIS — I10 Essential (primary) hypertension: Secondary | ICD-10-CM | POA: Diagnosis not present

## 2016-03-10 DIAGNOSIS — Z79899 Other long term (current) drug therapy: Secondary | ICD-10-CM | POA: Diagnosis not present

## 2016-03-15 DIAGNOSIS — M47816 Spondylosis without myelopathy or radiculopathy, lumbar region: Secondary | ICD-10-CM | POA: Diagnosis not present

## 2016-03-17 DIAGNOSIS — Z87440 Personal history of urinary (tract) infections: Secondary | ICD-10-CM | POA: Diagnosis not present

## 2016-03-17 DIAGNOSIS — R3915 Urgency of urination: Secondary | ICD-10-CM | POA: Diagnosis not present

## 2016-03-17 DIAGNOSIS — N390 Urinary tract infection, site not specified: Secondary | ICD-10-CM | POA: Diagnosis not present

## 2016-03-17 DIAGNOSIS — N359 Urethral stricture, unspecified: Secondary | ICD-10-CM | POA: Diagnosis not present

## 2016-03-17 DIAGNOSIS — R339 Retention of urine, unspecified: Secondary | ICD-10-CM | POA: Diagnosis not present

## 2016-03-17 DIAGNOSIS — N3289 Other specified disorders of bladder: Secondary | ICD-10-CM | POA: Diagnosis not present

## 2016-03-22 DIAGNOSIS — M5136 Other intervertebral disc degeneration, lumbar region: Secondary | ICD-10-CM | POA: Diagnosis not present

## 2016-03-25 DIAGNOSIS — N390 Urinary tract infection, site not specified: Secondary | ICD-10-CM | POA: Diagnosis not present

## 2016-03-25 DIAGNOSIS — R319 Hematuria, unspecified: Secondary | ICD-10-CM | POA: Diagnosis not present

## 2016-03-25 DIAGNOSIS — R339 Retention of urine, unspecified: Secondary | ICD-10-CM | POA: Diagnosis not present

## 2016-03-29 DIAGNOSIS — N3020 Other chronic cystitis without hematuria: Secondary | ICD-10-CM | POA: Diagnosis not present

## 2016-03-29 DIAGNOSIS — N359 Urethral stricture, unspecified: Secondary | ICD-10-CM | POA: Diagnosis not present

## 2016-03-29 DIAGNOSIS — Z87440 Personal history of urinary (tract) infections: Secondary | ICD-10-CM | POA: Diagnosis not present

## 2016-03-29 DIAGNOSIS — R301 Vesical tenesmus: Secondary | ICD-10-CM | POA: Diagnosis not present

## 2016-03-29 DIAGNOSIS — R339 Retention of urine, unspecified: Secondary | ICD-10-CM | POA: Diagnosis not present

## 2016-03-29 DIAGNOSIS — N390 Urinary tract infection, site not specified: Secondary | ICD-10-CM | POA: Diagnosis not present

## 2016-03-29 DIAGNOSIS — R8281 Pyuria: Secondary | ICD-10-CM | POA: Insufficient documentation

## 2016-03-30 DIAGNOSIS — E785 Hyperlipidemia, unspecified: Secondary | ICD-10-CM | POA: Diagnosis not present

## 2016-03-30 DIAGNOSIS — E890 Postprocedural hypothyroidism: Secondary | ICD-10-CM | POA: Diagnosis not present

## 2016-03-30 DIAGNOSIS — I4891 Unspecified atrial fibrillation: Secondary | ICD-10-CM | POA: Diagnosis not present

## 2016-03-30 DIAGNOSIS — I10 Essential (primary) hypertension: Secondary | ICD-10-CM | POA: Diagnosis not present

## 2016-03-30 DIAGNOSIS — M810 Age-related osteoporosis without current pathological fracture: Secondary | ICD-10-CM | POA: Diagnosis not present

## 2016-03-30 DIAGNOSIS — Z6823 Body mass index (BMI) 23.0-23.9, adult: Secondary | ICD-10-CM | POA: Diagnosis not present

## 2016-03-30 DIAGNOSIS — E538 Deficiency of other specified B group vitamins: Secondary | ICD-10-CM | POA: Diagnosis not present

## 2016-03-30 DIAGNOSIS — Z79899 Other long term (current) drug therapy: Secondary | ICD-10-CM | POA: Diagnosis not present

## 2016-03-30 DIAGNOSIS — R413 Other amnesia: Secondary | ICD-10-CM | POA: Diagnosis not present

## 2016-04-04 ENCOUNTER — Ambulatory Visit (INDEPENDENT_AMBULATORY_CARE_PROVIDER_SITE_OTHER): Payer: Medicare Other | Admitting: Podiatry

## 2016-04-04 ENCOUNTER — Encounter: Payer: Self-pay | Admitting: Podiatry

## 2016-04-04 DIAGNOSIS — B351 Tinea unguium: Secondary | ICD-10-CM | POA: Diagnosis not present

## 2016-04-04 DIAGNOSIS — M79674 Pain in right toe(s): Secondary | ICD-10-CM

## 2016-04-04 DIAGNOSIS — M79609 Pain in unspecified limb: Secondary | ICD-10-CM

## 2016-04-04 NOTE — Progress Notes (Signed)
Subjective:     Patient ID: Jean Ruiz, female   DOB: 06-Feb-1935, 81 y.o.   MRN: [id number]  HPI this patient presents to the office for long thick painful big toenail, left foot. She states that the nail is growing thicker and longer and is unattached from the nail bed. There is occasional pain as she walks and wears her shoes. She has been treated by Dr. Blenda Mounts with topical medication which provided minimal relief. She presents the office today for definitive evaluation and treatment of this condition   Review of Systems     Objective:   Physical Exam GENERAL APPEARANCE: Alert, conversant. Appropriately groomed. No acute distress.  VASCULAR: Pedal pulses are  palpable at  [facility] and PT bilateral.  Capillary refill time is immediate to all digits,  Normal temperature gradient.  Venous stasis lower legs B/L NEUROLOGIC: sensation is normal to 5.07 monofilament at 5/5 sites bilateral.  Light touch is intact bilateral, Muscle strength normal.  MUSCULOSKELETAL: acceptable muscle strength, tone and stability bilateral.  Intrinsic muscluature intact bilateral.  Rectus appearance of foot and digits noted bilateral.   DERMATOLOGIC: skin color, texture, and turgor are within normal limits.  No preulcerative lesions or ulcers  are seen, no interdigital maceration noted.  No open lesions present.  Digital nails are asymptomatic. No drainage noted. Thick disfigured discolored nail left hallux.      Assessment:     Onychomycosis     Plan:     Debridement of nail left hallux.  RTC 3 months.       Gardiner Barefoot DPM

## 2016-04-21 DIAGNOSIS — M47816 Spondylosis without myelopathy or radiculopathy, lumbar region: Secondary | ICD-10-CM | POA: Diagnosis not present

## 2016-04-21 DIAGNOSIS — M5136 Other intervertebral disc degeneration, lumbar region: Secondary | ICD-10-CM | POA: Diagnosis not present

## 2016-04-21 DIAGNOSIS — M791 Myalgia: Secondary | ICD-10-CM | POA: Diagnosis not present

## 2016-04-28 DIAGNOSIS — R339 Retention of urine, unspecified: Secondary | ICD-10-CM | POA: Diagnosis not present

## 2016-04-28 DIAGNOSIS — R3915 Urgency of urination: Secondary | ICD-10-CM | POA: Diagnosis not present

## 2016-04-28 DIAGNOSIS — N359 Urethral stricture, unspecified: Secondary | ICD-10-CM | POA: Diagnosis not present

## 2016-04-28 DIAGNOSIS — N3289 Other specified disorders of bladder: Secondary | ICD-10-CM | POA: Diagnosis not present

## 2016-04-28 DIAGNOSIS — R3129 Other microscopic hematuria: Secondary | ICD-10-CM | POA: Diagnosis not present

## 2016-05-05 DIAGNOSIS — E039 Hypothyroidism, unspecified: Secondary | ICD-10-CM | POA: Diagnosis not present

## 2016-05-05 DIAGNOSIS — I482 Chronic atrial fibrillation: Secondary | ICD-10-CM | POA: Diagnosis not present

## 2016-05-05 DIAGNOSIS — I10 Essential (primary) hypertension: Secondary | ICD-10-CM | POA: Diagnosis not present

## 2016-05-10 DIAGNOSIS — R350 Frequency of micturition: Secondary | ICD-10-CM | POA: Diagnosis not present

## 2016-05-10 DIAGNOSIS — R339 Retention of urine, unspecified: Secondary | ICD-10-CM | POA: Diagnosis not present

## 2016-05-10 DIAGNOSIS — N3020 Other chronic cystitis without hematuria: Secondary | ICD-10-CM | POA: Diagnosis not present

## 2016-05-10 DIAGNOSIS — N359 Urethral stricture, unspecified: Secondary | ICD-10-CM | POA: Diagnosis not present

## 2016-05-10 DIAGNOSIS — R300 Dysuria: Secondary | ICD-10-CM | POA: Diagnosis not present

## 2016-05-17 DIAGNOSIS — I4891 Unspecified atrial fibrillation: Secondary | ICD-10-CM | POA: Diagnosis not present

## 2016-05-17 DIAGNOSIS — I10 Essential (primary) hypertension: Secondary | ICD-10-CM | POA: Diagnosis not present

## 2016-05-17 DIAGNOSIS — E032 Hypothyroidism due to medicaments and other exogenous substances: Secondary | ICD-10-CM | POA: Diagnosis not present

## 2016-05-23 DIAGNOSIS — M47816 Spondylosis without myelopathy or radiculopathy, lumbar region: Secondary | ICD-10-CM | POA: Diagnosis not present

## 2016-06-06 DIAGNOSIS — Z6824 Body mass index (BMI) 24.0-24.9, adult: Secondary | ICD-10-CM | POA: Diagnosis not present

## 2016-06-06 DIAGNOSIS — I872 Venous insufficiency (chronic) (peripheral): Secondary | ICD-10-CM | POA: Diagnosis not present

## 2016-06-06 DIAGNOSIS — I10 Essential (primary) hypertension: Secondary | ICD-10-CM | POA: Diagnosis not present

## 2016-06-06 DIAGNOSIS — R600 Localized edema: Secondary | ICD-10-CM | POA: Diagnosis not present

## 2016-06-17 DIAGNOSIS — R404 Transient alteration of awareness: Secondary | ICD-10-CM | POA: Diagnosis not present

## 2016-06-17 DIAGNOSIS — R531 Weakness: Secondary | ICD-10-CM | POA: Diagnosis not present

## 2016-06-17 DIAGNOSIS — S32010A Wedge compression fracture of first lumbar vertebra, initial encounter for closed fracture: Secondary | ICD-10-CM | POA: Diagnosis not present

## 2016-06-17 DIAGNOSIS — E039 Hypothyroidism, unspecified: Secondary | ICD-10-CM | POA: Diagnosis not present

## 2016-06-17 DIAGNOSIS — A419 Sepsis, unspecified organism: Secondary | ICD-10-CM | POA: Diagnosis not present

## 2016-06-17 DIAGNOSIS — S0990XA Unspecified injury of head, initial encounter: Secondary | ICD-10-CM | POA: Diagnosis not present

## 2016-06-19 DIAGNOSIS — R29898 Other symptoms and signs involving the musculoskeletal system: Secondary | ICD-10-CM | POA: Diagnosis not present

## 2016-06-19 DIAGNOSIS — Z9181 History of falling: Secondary | ICD-10-CM | POA: Diagnosis not present

## 2016-06-19 DIAGNOSIS — Z7982 Long term (current) use of aspirin: Secondary | ICD-10-CM | POA: Diagnosis not present

## 2016-06-19 DIAGNOSIS — I4891 Unspecified atrial fibrillation: Secondary | ICD-10-CM | POA: Diagnosis not present

## 2016-06-19 DIAGNOSIS — S32010D Wedge compression fracture of first lumbar vertebra, subsequent encounter for fracture with routine healing: Secondary | ICD-10-CM | POA: Diagnosis not present

## 2016-06-19 DIAGNOSIS — R262 Difficulty in walking, not elsewhere classified: Secondary | ICD-10-CM | POA: Diagnosis not present

## 2016-06-19 DIAGNOSIS — I10 Essential (primary) hypertension: Secondary | ICD-10-CM | POA: Diagnosis not present

## 2016-06-21 DIAGNOSIS — R29898 Other symptoms and signs involving the musculoskeletal system: Secondary | ICD-10-CM | POA: Diagnosis not present

## 2016-06-21 DIAGNOSIS — I4891 Unspecified atrial fibrillation: Secondary | ICD-10-CM | POA: Diagnosis not present

## 2016-06-21 DIAGNOSIS — Z7982 Long term (current) use of aspirin: Secondary | ICD-10-CM | POA: Diagnosis not present

## 2016-06-21 DIAGNOSIS — S32010D Wedge compression fracture of first lumbar vertebra, subsequent encounter for fracture with routine healing: Secondary | ICD-10-CM | POA: Diagnosis not present

## 2016-06-21 DIAGNOSIS — I10 Essential (primary) hypertension: Secondary | ICD-10-CM | POA: Diagnosis not present

## 2016-06-21 DIAGNOSIS — R262 Difficulty in walking, not elsewhere classified: Secondary | ICD-10-CM | POA: Diagnosis not present

## 2016-06-23 DIAGNOSIS — E039 Hypothyroidism, unspecified: Secondary | ICD-10-CM | POA: Diagnosis not present

## 2016-06-27 ENCOUNTER — Ambulatory Visit: Payer: Medicare Other | Admitting: Podiatry

## 2016-06-28 DIAGNOSIS — R262 Difficulty in walking, not elsewhere classified: Secondary | ICD-10-CM | POA: Diagnosis not present

## 2016-06-28 DIAGNOSIS — R339 Retention of urine, unspecified: Secondary | ICD-10-CM | POA: Diagnosis not present

## 2016-06-28 DIAGNOSIS — Z87440 Personal history of urinary (tract) infections: Secondary | ICD-10-CM | POA: Diagnosis not present

## 2016-06-28 DIAGNOSIS — R29898 Other symptoms and signs involving the musculoskeletal system: Secondary | ICD-10-CM | POA: Diagnosis not present

## 2016-06-28 DIAGNOSIS — Z7982 Long term (current) use of aspirin: Secondary | ICD-10-CM | POA: Diagnosis not present

## 2016-06-28 DIAGNOSIS — N359 Urethral stricture, unspecified: Secondary | ICD-10-CM | POA: Diagnosis not present

## 2016-06-28 DIAGNOSIS — S32010D Wedge compression fracture of first lumbar vertebra, subsequent encounter for fracture with routine healing: Secondary | ICD-10-CM | POA: Diagnosis not present

## 2016-06-28 DIAGNOSIS — I4891 Unspecified atrial fibrillation: Secondary | ICD-10-CM | POA: Diagnosis not present

## 2016-06-28 DIAGNOSIS — N3020 Other chronic cystitis without hematuria: Secondary | ICD-10-CM | POA: Diagnosis not present

## 2016-06-28 DIAGNOSIS — R350 Frequency of micturition: Secondary | ICD-10-CM | POA: Diagnosis not present

## 2016-06-28 DIAGNOSIS — I10 Essential (primary) hypertension: Secondary | ICD-10-CM | POA: Diagnosis not present

## 2016-07-01 DIAGNOSIS — I4891 Unspecified atrial fibrillation: Secondary | ICD-10-CM | POA: Diagnosis not present

## 2016-07-01 DIAGNOSIS — I10 Essential (primary) hypertension: Secondary | ICD-10-CM | POA: Diagnosis not present

## 2016-07-01 DIAGNOSIS — S32010D Wedge compression fracture of first lumbar vertebra, subsequent encounter for fracture with routine healing: Secondary | ICD-10-CM | POA: Diagnosis not present

## 2016-07-01 DIAGNOSIS — R29898 Other symptoms and signs involving the musculoskeletal system: Secondary | ICD-10-CM | POA: Diagnosis not present

## 2016-07-01 DIAGNOSIS — Z7982 Long term (current) use of aspirin: Secondary | ICD-10-CM | POA: Diagnosis not present

## 2016-07-01 DIAGNOSIS — R262 Difficulty in walking, not elsewhere classified: Secondary | ICD-10-CM | POA: Diagnosis not present

## 2016-07-05 DIAGNOSIS — E032 Hypothyroidism due to medicaments and other exogenous substances: Secondary | ICD-10-CM | POA: Diagnosis not present

## 2016-07-05 DIAGNOSIS — G629 Polyneuropathy, unspecified: Secondary | ICD-10-CM | POA: Diagnosis not present

## 2016-07-05 DIAGNOSIS — Z09 Encounter for follow-up examination after completed treatment for conditions other than malignant neoplasm: Secondary | ICD-10-CM | POA: Diagnosis not present

## 2016-07-05 DIAGNOSIS — M549 Dorsalgia, unspecified: Secondary | ICD-10-CM | POA: Diagnosis not present

## 2016-07-06 DIAGNOSIS — I4891 Unspecified atrial fibrillation: Secondary | ICD-10-CM | POA: Diagnosis not present

## 2016-07-06 DIAGNOSIS — I10 Essential (primary) hypertension: Secondary | ICD-10-CM | POA: Diagnosis not present

## 2016-07-06 DIAGNOSIS — R29898 Other symptoms and signs involving the musculoskeletal system: Secondary | ICD-10-CM | POA: Diagnosis not present

## 2016-07-06 DIAGNOSIS — S32010D Wedge compression fracture of first lumbar vertebra, subsequent encounter for fracture with routine healing: Secondary | ICD-10-CM | POA: Diagnosis not present

## 2016-07-06 DIAGNOSIS — R262 Difficulty in walking, not elsewhere classified: Secondary | ICD-10-CM | POA: Diagnosis not present

## 2016-07-06 DIAGNOSIS — Z7982 Long term (current) use of aspirin: Secondary | ICD-10-CM | POA: Diagnosis not present

## 2016-07-07 DIAGNOSIS — I10 Essential (primary) hypertension: Secondary | ICD-10-CM | POA: Diagnosis not present

## 2016-07-07 DIAGNOSIS — Z6823 Body mass index (BMI) 23.0-23.9, adult: Secondary | ICD-10-CM | POA: Diagnosis not present

## 2016-07-08 DIAGNOSIS — R262 Difficulty in walking, not elsewhere classified: Secondary | ICD-10-CM | POA: Diagnosis not present

## 2016-07-08 DIAGNOSIS — R29898 Other symptoms and signs involving the musculoskeletal system: Secondary | ICD-10-CM | POA: Diagnosis not present

## 2016-07-08 DIAGNOSIS — S32010D Wedge compression fracture of first lumbar vertebra, subsequent encounter for fracture with routine healing: Secondary | ICD-10-CM | POA: Diagnosis not present

## 2016-07-08 DIAGNOSIS — Z7982 Long term (current) use of aspirin: Secondary | ICD-10-CM | POA: Diagnosis not present

## 2016-07-08 DIAGNOSIS — I10 Essential (primary) hypertension: Secondary | ICD-10-CM | POA: Diagnosis not present

## 2016-07-08 DIAGNOSIS — I4891 Unspecified atrial fibrillation: Secondary | ICD-10-CM | POA: Diagnosis not present

## 2016-07-11 DIAGNOSIS — R262 Difficulty in walking, not elsewhere classified: Secondary | ICD-10-CM | POA: Diagnosis not present

## 2016-07-11 DIAGNOSIS — R29898 Other symptoms and signs involving the musculoskeletal system: Secondary | ICD-10-CM | POA: Diagnosis not present

## 2016-07-11 DIAGNOSIS — S32010D Wedge compression fracture of first lumbar vertebra, subsequent encounter for fracture with routine healing: Secondary | ICD-10-CM | POA: Diagnosis not present

## 2016-07-11 DIAGNOSIS — I4891 Unspecified atrial fibrillation: Secondary | ICD-10-CM | POA: Diagnosis not present

## 2016-07-11 DIAGNOSIS — Z7982 Long term (current) use of aspirin: Secondary | ICD-10-CM | POA: Diagnosis not present

## 2016-07-11 DIAGNOSIS — I10 Essential (primary) hypertension: Secondary | ICD-10-CM | POA: Diagnosis not present

## 2016-07-12 DIAGNOSIS — W19XXXA Unspecified fall, initial encounter: Secondary | ICD-10-CM | POA: Diagnosis not present

## 2016-07-12 DIAGNOSIS — I10 Essential (primary) hypertension: Secondary | ICD-10-CM | POA: Diagnosis not present

## 2016-07-12 DIAGNOSIS — E039 Hypothyroidism, unspecified: Secondary | ICD-10-CM | POA: Diagnosis not present

## 2016-07-14 DIAGNOSIS — Z7982 Long term (current) use of aspirin: Secondary | ICD-10-CM | POA: Diagnosis not present

## 2016-07-14 DIAGNOSIS — I4891 Unspecified atrial fibrillation: Secondary | ICD-10-CM | POA: Diagnosis not present

## 2016-07-14 DIAGNOSIS — I10 Essential (primary) hypertension: Secondary | ICD-10-CM | POA: Diagnosis not present

## 2016-07-14 DIAGNOSIS — M8008XA Age-related osteoporosis with current pathological fracture, vertebra(e), initial encounter for fracture: Secondary | ICD-10-CM | POA: Diagnosis not present

## 2016-07-14 DIAGNOSIS — S32010D Wedge compression fracture of first lumbar vertebra, subsequent encounter for fracture with routine healing: Secondary | ICD-10-CM | POA: Diagnosis not present

## 2016-07-14 DIAGNOSIS — R262 Difficulty in walking, not elsewhere classified: Secondary | ICD-10-CM | POA: Diagnosis not present

## 2016-07-14 DIAGNOSIS — R29898 Other symptoms and signs involving the musculoskeletal system: Secondary | ICD-10-CM | POA: Diagnosis not present

## 2016-07-18 DIAGNOSIS — I482 Chronic atrial fibrillation: Secondary | ICD-10-CM | POA: Diagnosis not present

## 2016-07-18 DIAGNOSIS — M5136 Other intervertebral disc degeneration, lumbar region: Secondary | ICD-10-CM | POA: Diagnosis not present

## 2016-07-18 DIAGNOSIS — I10 Essential (primary) hypertension: Secondary | ICD-10-CM | POA: Diagnosis not present

## 2016-07-18 DIAGNOSIS — Z01810 Encounter for preprocedural cardiovascular examination: Secondary | ICD-10-CM | POA: Diagnosis not present

## 2016-07-20 DIAGNOSIS — M8008XS Age-related osteoporosis with current pathological fracture, vertebra(e), sequela: Secondary | ICD-10-CM | POA: Diagnosis not present

## 2016-08-19 DIAGNOSIS — Z6823 Body mass index (BMI) 23.0-23.9, adult: Secondary | ICD-10-CM | POA: Diagnosis not present

## 2016-08-19 DIAGNOSIS — R600 Localized edema: Secondary | ICD-10-CM | POA: Diagnosis not present

## 2016-08-19 DIAGNOSIS — I10 Essential (primary) hypertension: Secondary | ICD-10-CM | POA: Diagnosis not present

## 2016-08-19 DIAGNOSIS — I872 Venous insufficiency (chronic) (peripheral): Secondary | ICD-10-CM | POA: Diagnosis not present

## 2016-08-22 DIAGNOSIS — Z6823 Body mass index (BMI) 23.0-23.9, adult: Secondary | ICD-10-CM | POA: Diagnosis not present

## 2016-08-22 DIAGNOSIS — J019 Acute sinusitis, unspecified: Secondary | ICD-10-CM | POA: Diagnosis not present

## 2016-08-23 DIAGNOSIS — E032 Hypothyroidism due to medicaments and other exogenous substances: Secondary | ICD-10-CM | POA: Diagnosis not present

## 2016-08-30 DIAGNOSIS — M549 Dorsalgia, unspecified: Secondary | ICD-10-CM | POA: Diagnosis not present

## 2016-08-30 DIAGNOSIS — E032 Hypothyroidism due to medicaments and other exogenous substances: Secondary | ICD-10-CM | POA: Diagnosis not present

## 2016-09-02 DIAGNOSIS — N3020 Other chronic cystitis without hematuria: Secondary | ICD-10-CM | POA: Diagnosis not present

## 2016-09-02 DIAGNOSIS — N359 Urethral stricture, unspecified: Secondary | ICD-10-CM | POA: Diagnosis not present

## 2016-09-02 DIAGNOSIS — N390 Urinary tract infection, site not specified: Secondary | ICD-10-CM | POA: Diagnosis not present

## 2016-09-02 DIAGNOSIS — Z87440 Personal history of urinary (tract) infections: Secondary | ICD-10-CM | POA: Diagnosis not present

## 2016-09-02 DIAGNOSIS — R339 Retention of urine, unspecified: Secondary | ICD-10-CM | POA: Diagnosis not present

## 2016-09-02 DIAGNOSIS — N3289 Other specified disorders of bladder: Secondary | ICD-10-CM | POA: Diagnosis not present

## 2016-09-07 DIAGNOSIS — M47817 Spondylosis without myelopathy or radiculopathy, lumbosacral region: Secondary | ICD-10-CM | POA: Diagnosis not present

## 2016-09-07 DIAGNOSIS — M47816 Spondylosis without myelopathy or radiculopathy, lumbar region: Secondary | ICD-10-CM | POA: Diagnosis not present

## 2016-09-26 DIAGNOSIS — I10 Essential (primary) hypertension: Secondary | ICD-10-CM | POA: Diagnosis not present

## 2016-09-30 DIAGNOSIS — Z23 Encounter for immunization: Secondary | ICD-10-CM | POA: Diagnosis not present

## 2016-10-06 DIAGNOSIS — M8008XS Age-related osteoporosis with current pathological fracture, vertebra(e), sequela: Secondary | ICD-10-CM | POA: Diagnosis not present

## 2016-10-06 DIAGNOSIS — M791 Myalgia: Secondary | ICD-10-CM | POA: Diagnosis not present

## 2016-10-06 DIAGNOSIS — Z9181 History of falling: Secondary | ICD-10-CM | POA: Diagnosis not present

## 2016-10-06 DIAGNOSIS — Z1389 Encounter for screening for other disorder: Secondary | ICD-10-CM | POA: Diagnosis not present

## 2016-10-06 DIAGNOSIS — M47816 Spondylosis without myelopathy or radiculopathy, lumbar region: Secondary | ICD-10-CM | POA: Diagnosis not present

## 2016-10-06 DIAGNOSIS — I4891 Unspecified atrial fibrillation: Secondary | ICD-10-CM | POA: Diagnosis not present

## 2016-10-06 DIAGNOSIS — E785 Hyperlipidemia, unspecified: Secondary | ICD-10-CM | POA: Diagnosis not present

## 2016-10-06 DIAGNOSIS — M810 Age-related osteoporosis without current pathological fracture: Secondary | ICD-10-CM | POA: Diagnosis not present

## 2016-10-06 DIAGNOSIS — I10 Essential (primary) hypertension: Secondary | ICD-10-CM | POA: Diagnosis not present

## 2016-10-06 DIAGNOSIS — D519 Vitamin B12 deficiency anemia, unspecified: Secondary | ICD-10-CM | POA: Diagnosis not present

## 2016-10-06 DIAGNOSIS — E890 Postprocedural hypothyroidism: Secondary | ICD-10-CM | POA: Diagnosis not present

## 2016-10-06 DIAGNOSIS — M8589 Other specified disorders of bone density and structure, multiple sites: Secondary | ICD-10-CM | POA: Diagnosis not present

## 2016-10-10 DIAGNOSIS — R262 Difficulty in walking, not elsewhere classified: Secondary | ICD-10-CM | POA: Diagnosis not present

## 2016-10-10 DIAGNOSIS — R2681 Unsteadiness on feet: Secondary | ICD-10-CM | POA: Diagnosis not present

## 2016-10-10 DIAGNOSIS — M545 Low back pain: Secondary | ICD-10-CM | POA: Diagnosis not present

## 2016-10-10 DIAGNOSIS — M6281 Muscle weakness (generalized): Secondary | ICD-10-CM | POA: Diagnosis not present

## 2016-10-13 DIAGNOSIS — R2681 Unsteadiness on feet: Secondary | ICD-10-CM | POA: Diagnosis not present

## 2016-10-13 DIAGNOSIS — M6281 Muscle weakness (generalized): Secondary | ICD-10-CM | POA: Diagnosis not present

## 2016-10-13 DIAGNOSIS — R262 Difficulty in walking, not elsewhere classified: Secondary | ICD-10-CM | POA: Diagnosis not present

## 2016-10-13 DIAGNOSIS — M545 Low back pain: Secondary | ICD-10-CM | POA: Diagnosis not present

## 2016-10-17 DIAGNOSIS — J309 Allergic rhinitis, unspecified: Secondary | ICD-10-CM | POA: Diagnosis not present

## 2016-10-17 DIAGNOSIS — Z6822 Body mass index (BMI) 22.0-22.9, adult: Secondary | ICD-10-CM | POA: Diagnosis not present

## 2016-10-17 DIAGNOSIS — J019 Acute sinusitis, unspecified: Secondary | ICD-10-CM | POA: Diagnosis not present

## 2016-10-18 DIAGNOSIS — R262 Difficulty in walking, not elsewhere classified: Secondary | ICD-10-CM | POA: Diagnosis not present

## 2016-10-18 DIAGNOSIS — R2681 Unsteadiness on feet: Secondary | ICD-10-CM | POA: Diagnosis not present

## 2016-10-18 DIAGNOSIS — M6281 Muscle weakness (generalized): Secondary | ICD-10-CM | POA: Diagnosis not present

## 2016-10-18 DIAGNOSIS — M545 Low back pain: Secondary | ICD-10-CM | POA: Diagnosis not present

## 2016-10-24 DIAGNOSIS — M545 Low back pain: Secondary | ICD-10-CM | POA: Diagnosis not present

## 2016-10-24 DIAGNOSIS — M6281 Muscle weakness (generalized): Secondary | ICD-10-CM | POA: Diagnosis not present

## 2016-10-24 DIAGNOSIS — R262 Difficulty in walking, not elsewhere classified: Secondary | ICD-10-CM | POA: Diagnosis not present

## 2016-10-24 DIAGNOSIS — R2681 Unsteadiness on feet: Secondary | ICD-10-CM | POA: Diagnosis not present

## 2016-10-26 DIAGNOSIS — R262 Difficulty in walking, not elsewhere classified: Secondary | ICD-10-CM | POA: Diagnosis not present

## 2016-10-26 DIAGNOSIS — M6281 Muscle weakness (generalized): Secondary | ICD-10-CM | POA: Diagnosis not present

## 2016-10-26 DIAGNOSIS — M545 Low back pain: Secondary | ICD-10-CM | POA: Diagnosis not present

## 2016-10-26 DIAGNOSIS — R2681 Unsteadiness on feet: Secondary | ICD-10-CM | POA: Diagnosis not present

## 2016-10-31 DIAGNOSIS — R2681 Unsteadiness on feet: Secondary | ICD-10-CM | POA: Diagnosis not present

## 2016-10-31 DIAGNOSIS — M6281 Muscle weakness (generalized): Secondary | ICD-10-CM | POA: Diagnosis not present

## 2016-10-31 DIAGNOSIS — R262 Difficulty in walking, not elsewhere classified: Secondary | ICD-10-CM | POA: Diagnosis not present

## 2016-10-31 DIAGNOSIS — M545 Low back pain: Secondary | ICD-10-CM | POA: Diagnosis not present

## 2016-11-02 DIAGNOSIS — R262 Difficulty in walking, not elsewhere classified: Secondary | ICD-10-CM | POA: Diagnosis not present

## 2016-11-02 DIAGNOSIS — R2681 Unsteadiness on feet: Secondary | ICD-10-CM | POA: Diagnosis not present

## 2016-11-02 DIAGNOSIS — M545 Low back pain: Secondary | ICD-10-CM | POA: Diagnosis not present

## 2016-11-02 DIAGNOSIS — M6281 Muscle weakness (generalized): Secondary | ICD-10-CM | POA: Diagnosis not present

## 2016-11-07 DIAGNOSIS — M85832 Other specified disorders of bone density and structure, left forearm: Secondary | ICD-10-CM | POA: Diagnosis not present

## 2016-11-07 DIAGNOSIS — M8589 Other specified disorders of bone density and structure, multiple sites: Secondary | ICD-10-CM | POA: Diagnosis not present

## 2016-11-08 DIAGNOSIS — E039 Hypothyroidism, unspecified: Secondary | ICD-10-CM | POA: Diagnosis not present

## 2016-11-10 DIAGNOSIS — R2681 Unsteadiness on feet: Secondary | ICD-10-CM | POA: Diagnosis not present

## 2016-11-10 DIAGNOSIS — M545 Low back pain: Secondary | ICD-10-CM | POA: Diagnosis not present

## 2016-11-10 DIAGNOSIS — M6281 Muscle weakness (generalized): Secondary | ICD-10-CM | POA: Diagnosis not present

## 2016-11-10 DIAGNOSIS — R262 Difficulty in walking, not elsewhere classified: Secondary | ICD-10-CM | POA: Diagnosis not present

## 2016-11-15 DIAGNOSIS — I10 Essential (primary) hypertension: Secondary | ICD-10-CM | POA: Diagnosis not present

## 2016-11-15 DIAGNOSIS — E032 Hypothyroidism due to medicaments and other exogenous substances: Secondary | ICD-10-CM | POA: Diagnosis not present

## 2016-11-15 DIAGNOSIS — M549 Dorsalgia, unspecified: Secondary | ICD-10-CM | POA: Diagnosis not present

## 2016-11-15 DIAGNOSIS — M810 Age-related osteoporosis without current pathological fracture: Secondary | ICD-10-CM | POA: Diagnosis not present

## 2016-11-17 DIAGNOSIS — Z6824 Body mass index (BMI) 24.0-24.9, adult: Secondary | ICD-10-CM | POA: Diagnosis not present

## 2016-11-17 DIAGNOSIS — M6281 Muscle weakness (generalized): Secondary | ICD-10-CM | POA: Diagnosis not present

## 2016-11-17 DIAGNOSIS — M4316 Spondylolisthesis, lumbar region: Secondary | ICD-10-CM | POA: Diagnosis not present

## 2016-11-17 DIAGNOSIS — R2681 Unsteadiness on feet: Secondary | ICD-10-CM | POA: Diagnosis not present

## 2016-11-17 DIAGNOSIS — M47816 Spondylosis without myelopathy or radiculopathy, lumbar region: Secondary | ICD-10-CM | POA: Diagnosis not present

## 2016-11-17 DIAGNOSIS — R262 Difficulty in walking, not elsewhere classified: Secondary | ICD-10-CM | POA: Diagnosis not present

## 2016-11-17 DIAGNOSIS — M791 Myalgia: Secondary | ICD-10-CM | POA: Diagnosis not present

## 2016-11-17 DIAGNOSIS — M545 Low back pain: Secondary | ICD-10-CM | POA: Diagnosis not present

## 2016-12-04 DIAGNOSIS — I4891 Unspecified atrial fibrillation: Secondary | ICD-10-CM | POA: Diagnosis not present

## 2016-12-04 DIAGNOSIS — R110 Nausea: Secondary | ICD-10-CM | POA: Diagnosis not present

## 2016-12-04 DIAGNOSIS — E079 Disorder of thyroid, unspecified: Secondary | ICD-10-CM | POA: Diagnosis not present

## 2016-12-04 DIAGNOSIS — R350 Frequency of micturition: Secondary | ICD-10-CM | POA: Diagnosis not present

## 2016-12-04 DIAGNOSIS — I10 Essential (primary) hypertension: Secondary | ICD-10-CM | POA: Diagnosis not present

## 2016-12-04 DIAGNOSIS — R531 Weakness: Secondary | ICD-10-CM | POA: Diagnosis not present

## 2016-12-04 DIAGNOSIS — R32 Unspecified urinary incontinence: Secondary | ICD-10-CM | POA: Diagnosis not present

## 2016-12-26 DIAGNOSIS — Z79899 Other long term (current) drug therapy: Secondary | ICD-10-CM | POA: Diagnosis not present

## 2017-01-06 DIAGNOSIS — S81801A Unspecified open wound, right lower leg, initial encounter: Secondary | ICD-10-CM | POA: Diagnosis not present

## 2017-01-06 DIAGNOSIS — M810 Age-related osteoporosis without current pathological fracture: Secondary | ICD-10-CM | POA: Diagnosis not present

## 2017-01-06 DIAGNOSIS — Z6823 Body mass index (BMI) 23.0-23.9, adult: Secondary | ICD-10-CM | POA: Diagnosis not present

## 2017-01-12 DIAGNOSIS — R350 Frequency of micturition: Secondary | ICD-10-CM | POA: Diagnosis not present

## 2017-01-12 DIAGNOSIS — Z87440 Personal history of urinary (tract) infections: Secondary | ICD-10-CM | POA: Diagnosis not present

## 2017-01-12 DIAGNOSIS — R339 Retention of urine, unspecified: Secondary | ICD-10-CM | POA: Diagnosis not present

## 2017-01-12 DIAGNOSIS — N359 Urethral stricture, unspecified: Secondary | ICD-10-CM | POA: Diagnosis not present

## 2017-01-21 DIAGNOSIS — J019 Acute sinusitis, unspecified: Secondary | ICD-10-CM | POA: Diagnosis not present

## 2017-01-21 DIAGNOSIS — J029 Acute pharyngitis, unspecified: Secondary | ICD-10-CM | POA: Diagnosis not present

## 2017-01-21 DIAGNOSIS — R6889 Other general symptoms and signs: Secondary | ICD-10-CM | POA: Diagnosis not present

## 2017-01-28 DIAGNOSIS — J208 Acute bronchitis due to other specified organisms: Secondary | ICD-10-CM | POA: Diagnosis not present

## 2017-01-28 DIAGNOSIS — J019 Acute sinusitis, unspecified: Secondary | ICD-10-CM | POA: Diagnosis not present

## 2017-02-23 DIAGNOSIS — I10 Essential (primary) hypertension: Secondary | ICD-10-CM | POA: Diagnosis not present

## 2017-02-23 DIAGNOSIS — H25813 Combined forms of age-related cataract, bilateral: Secondary | ICD-10-CM | POA: Diagnosis not present

## 2017-02-23 DIAGNOSIS — H5213 Myopia, bilateral: Secondary | ICD-10-CM | POA: Diagnosis not present

## 2017-02-23 DIAGNOSIS — H43393 Other vitreous opacities, bilateral: Secondary | ICD-10-CM | POA: Diagnosis not present

## 2017-02-23 DIAGNOSIS — H524 Presbyopia: Secondary | ICD-10-CM | POA: Diagnosis not present

## 2017-02-23 DIAGNOSIS — H52223 Regular astigmatism, bilateral: Secondary | ICD-10-CM | POA: Diagnosis not present

## 2017-02-23 DIAGNOSIS — Z1231 Encounter for screening mammogram for malignant neoplasm of breast: Secondary | ICD-10-CM | POA: Diagnosis not present

## 2017-02-23 DIAGNOSIS — H43813 Vitreous degeneration, bilateral: Secondary | ICD-10-CM | POA: Diagnosis not present

## 2017-02-24 DIAGNOSIS — N3090 Cystitis, unspecified without hematuria: Secondary | ICD-10-CM | POA: Insufficient documentation

## 2017-02-24 DIAGNOSIS — N359 Urethral stricture, unspecified: Secondary | ICD-10-CM | POA: Diagnosis not present

## 2017-02-24 DIAGNOSIS — N390 Urinary tract infection, site not specified: Secondary | ICD-10-CM | POA: Diagnosis not present

## 2017-02-24 DIAGNOSIS — R3915 Urgency of urination: Secondary | ICD-10-CM | POA: Diagnosis not present

## 2017-02-24 DIAGNOSIS — R300 Dysuria: Secondary | ICD-10-CM | POA: Diagnosis not present

## 2017-02-24 DIAGNOSIS — R301 Vesical tenesmus: Secondary | ICD-10-CM | POA: Diagnosis not present

## 2017-02-24 DIAGNOSIS — R339 Retention of urine, unspecified: Secondary | ICD-10-CM | POA: Diagnosis not present

## 2017-03-14 DIAGNOSIS — R51 Headache: Secondary | ICD-10-CM | POA: Diagnosis not present

## 2017-03-14 DIAGNOSIS — I10 Essential (primary) hypertension: Secondary | ICD-10-CM | POA: Diagnosis not present

## 2017-03-20 DIAGNOSIS — E0789 Other specified disorders of thyroid: Secondary | ICD-10-CM | POA: Diagnosis not present

## 2017-03-28 DIAGNOSIS — E032 Hypothyroidism due to medicaments and other exogenous substances: Secondary | ICD-10-CM | POA: Diagnosis not present

## 2017-03-28 DIAGNOSIS — G4700 Insomnia, unspecified: Secondary | ICD-10-CM | POA: Diagnosis not present

## 2017-03-28 DIAGNOSIS — F419 Anxiety disorder, unspecified: Secondary | ICD-10-CM | POA: Diagnosis not present

## 2017-04-04 DIAGNOSIS — I10 Essential (primary) hypertension: Secondary | ICD-10-CM | POA: Diagnosis not present

## 2017-04-04 DIAGNOSIS — I482 Chronic atrial fibrillation: Secondary | ICD-10-CM | POA: Diagnosis not present

## 2017-04-11 DIAGNOSIS — R339 Retention of urine, unspecified: Secondary | ICD-10-CM | POA: Diagnosis not present

## 2017-04-11 DIAGNOSIS — Z87440 Personal history of urinary (tract) infections: Secondary | ICD-10-CM | POA: Diagnosis not present

## 2017-04-11 DIAGNOSIS — R3129 Other microscopic hematuria: Secondary | ICD-10-CM | POA: Diagnosis not present

## 2017-04-11 DIAGNOSIS — N3289 Other specified disorders of bladder: Secondary | ICD-10-CM | POA: Diagnosis not present

## 2017-04-11 DIAGNOSIS — N359 Urethral stricture, unspecified: Secondary | ICD-10-CM | POA: Diagnosis not present

## 2017-04-25 DIAGNOSIS — I10 Essential (primary) hypertension: Secondary | ICD-10-CM | POA: Diagnosis not present

## 2017-04-25 DIAGNOSIS — D519 Vitamin B12 deficiency anemia, unspecified: Secondary | ICD-10-CM | POA: Diagnosis not present

## 2017-04-25 DIAGNOSIS — I4891 Unspecified atrial fibrillation: Secondary | ICD-10-CM | POA: Diagnosis not present

## 2017-04-25 DIAGNOSIS — Z6822 Body mass index (BMI) 22.0-22.9, adult: Secondary | ICD-10-CM | POA: Diagnosis not present

## 2017-04-25 DIAGNOSIS — E785 Hyperlipidemia, unspecified: Secondary | ICD-10-CM | POA: Diagnosis not present

## 2017-04-25 DIAGNOSIS — M810 Age-related osteoporosis without current pathological fracture: Secondary | ICD-10-CM | POA: Diagnosis not present

## 2017-04-25 DIAGNOSIS — E890 Postprocedural hypothyroidism: Secondary | ICD-10-CM | POA: Diagnosis not present

## 2017-05-09 DIAGNOSIS — H40033 Anatomical narrow angle, bilateral: Secondary | ICD-10-CM | POA: Diagnosis not present

## 2017-05-09 DIAGNOSIS — H2511 Age-related nuclear cataract, right eye: Secondary | ICD-10-CM | POA: Diagnosis not present

## 2017-05-24 DIAGNOSIS — E0789 Other specified disorders of thyroid: Secondary | ICD-10-CM | POA: Diagnosis not present

## 2017-05-24 DIAGNOSIS — E039 Hypothyroidism, unspecified: Secondary | ICD-10-CM | POA: Diagnosis not present

## 2017-06-01 DIAGNOSIS — E039 Hypothyroidism, unspecified: Secondary | ICD-10-CM | POA: Diagnosis not present

## 2017-06-13 DIAGNOSIS — H2511 Age-related nuclear cataract, right eye: Secondary | ICD-10-CM | POA: Diagnosis not present

## 2017-06-13 DIAGNOSIS — H25811 Combined forms of age-related cataract, right eye: Secondary | ICD-10-CM | POA: Diagnosis not present

## 2017-06-21 DIAGNOSIS — I10 Essential (primary) hypertension: Secondary | ICD-10-CM | POA: Diagnosis not present

## 2017-06-21 DIAGNOSIS — Z961 Presence of intraocular lens: Secondary | ICD-10-CM | POA: Diagnosis not present

## 2017-06-21 DIAGNOSIS — H2511 Age-related nuclear cataract, right eye: Secondary | ICD-10-CM | POA: Diagnosis not present

## 2017-06-21 DIAGNOSIS — Z9841 Cataract extraction status, right eye: Secondary | ICD-10-CM | POA: Diagnosis not present

## 2017-06-26 DIAGNOSIS — H04123 Dry eye syndrome of bilateral lacrimal glands: Secondary | ICD-10-CM | POA: Diagnosis not present

## 2017-06-26 DIAGNOSIS — H2511 Age-related nuclear cataract, right eye: Secondary | ICD-10-CM | POA: Diagnosis not present

## 2017-06-26 DIAGNOSIS — Z9842 Cataract extraction status, left eye: Secondary | ICD-10-CM | POA: Diagnosis not present

## 2017-06-26 DIAGNOSIS — H40013 Open angle with borderline findings, low risk, bilateral: Secondary | ICD-10-CM | POA: Diagnosis not present

## 2017-06-26 DIAGNOSIS — H18232 Secondary corneal edema, left eye: Secondary | ICD-10-CM | POA: Diagnosis not present

## 2017-06-26 DIAGNOSIS — H25812 Combined forms of age-related cataract, left eye: Secondary | ICD-10-CM | POA: Diagnosis not present

## 2017-06-26 DIAGNOSIS — Z961 Presence of intraocular lens: Secondary | ICD-10-CM | POA: Diagnosis not present

## 2017-06-26 DIAGNOSIS — I10 Essential (primary) hypertension: Secondary | ICD-10-CM | POA: Diagnosis not present

## 2017-06-26 DIAGNOSIS — H2512 Age-related nuclear cataract, left eye: Secondary | ICD-10-CM | POA: Diagnosis not present

## 2017-06-26 DIAGNOSIS — H16121 Filamentary keratitis, right eye: Secondary | ICD-10-CM | POA: Diagnosis not present

## 2017-06-26 DIAGNOSIS — Z9841 Cataract extraction status, right eye: Secondary | ICD-10-CM | POA: Diagnosis not present

## 2017-06-29 DIAGNOSIS — N390 Urinary tract infection, site not specified: Secondary | ICD-10-CM | POA: Diagnosis not present

## 2017-06-29 DIAGNOSIS — R339 Retention of urine, unspecified: Secondary | ICD-10-CM | POA: Diagnosis not present

## 2017-07-06 DIAGNOSIS — M791 Myalgia: Secondary | ICD-10-CM | POA: Diagnosis not present

## 2017-07-06 DIAGNOSIS — M47816 Spondylosis without myelopathy or radiculopathy, lumbar region: Secondary | ICD-10-CM | POA: Diagnosis not present

## 2017-07-06 DIAGNOSIS — M5136 Other intervertebral disc degeneration, lumbar region: Secondary | ICD-10-CM | POA: Diagnosis not present

## 2017-07-17 DIAGNOSIS — N359 Urethral stricture, unspecified: Secondary | ICD-10-CM | POA: Diagnosis not present

## 2017-07-17 DIAGNOSIS — R339 Retention of urine, unspecified: Secondary | ICD-10-CM | POA: Diagnosis not present

## 2017-07-17 DIAGNOSIS — N390 Urinary tract infection, site not specified: Secondary | ICD-10-CM | POA: Diagnosis not present

## 2017-07-21 DIAGNOSIS — M810 Age-related osteoporosis without current pathological fracture: Secondary | ICD-10-CM | POA: Diagnosis not present

## 2017-07-21 DIAGNOSIS — S81802A Unspecified open wound, left lower leg, initial encounter: Secondary | ICD-10-CM | POA: Diagnosis not present

## 2017-07-21 DIAGNOSIS — Z6822 Body mass index (BMI) 22.0-22.9, adult: Secondary | ICD-10-CM | POA: Diagnosis not present

## 2017-07-21 DIAGNOSIS — I10 Essential (primary) hypertension: Secondary | ICD-10-CM | POA: Diagnosis not present

## 2017-07-21 DIAGNOSIS — M79662 Pain in left lower leg: Secondary | ICD-10-CM | POA: Diagnosis not present

## 2017-07-24 DIAGNOSIS — M47816 Spondylosis without myelopathy or radiculopathy, lumbar region: Secondary | ICD-10-CM | POA: Diagnosis not present

## 2017-07-24 DIAGNOSIS — M5136 Other intervertebral disc degeneration, lumbar region: Secondary | ICD-10-CM | POA: Diagnosis not present

## 2017-08-08 DIAGNOSIS — S81802A Unspecified open wound, left lower leg, initial encounter: Secondary | ICD-10-CM | POA: Diagnosis not present

## 2017-08-08 DIAGNOSIS — Z6822 Body mass index (BMI) 22.0-22.9, adult: Secondary | ICD-10-CM | POA: Diagnosis not present

## 2017-08-09 DIAGNOSIS — S81802A Unspecified open wound, left lower leg, initial encounter: Secondary | ICD-10-CM | POA: Diagnosis not present

## 2017-08-09 DIAGNOSIS — I872 Venous insufficiency (chronic) (peripheral): Secondary | ICD-10-CM | POA: Diagnosis not present

## 2017-08-09 DIAGNOSIS — I890 Lymphedema, not elsewhere classified: Secondary | ICD-10-CM | POA: Diagnosis not present

## 2017-08-16 DIAGNOSIS — I872 Venous insufficiency (chronic) (peripheral): Secondary | ICD-10-CM | POA: Diagnosis not present

## 2017-08-16 DIAGNOSIS — S81802A Unspecified open wound, left lower leg, initial encounter: Secondary | ICD-10-CM | POA: Diagnosis not present

## 2017-08-16 DIAGNOSIS — I890 Lymphedema, not elsewhere classified: Secondary | ICD-10-CM | POA: Diagnosis not present

## 2017-08-23 DIAGNOSIS — S81802A Unspecified open wound, left lower leg, initial encounter: Secondary | ICD-10-CM | POA: Diagnosis not present

## 2017-08-23 DIAGNOSIS — I872 Venous insufficiency (chronic) (peripheral): Secondary | ICD-10-CM | POA: Diagnosis not present

## 2017-08-23 DIAGNOSIS — I890 Lymphedema, not elsewhere classified: Secondary | ICD-10-CM | POA: Diagnosis not present

## 2017-08-30 DIAGNOSIS — S81802A Unspecified open wound, left lower leg, initial encounter: Secondary | ICD-10-CM | POA: Diagnosis not present

## 2017-08-30 DIAGNOSIS — I890 Lymphedema, not elsewhere classified: Secondary | ICD-10-CM | POA: Diagnosis not present

## 2017-08-30 DIAGNOSIS — I872 Venous insufficiency (chronic) (peripheral): Secondary | ICD-10-CM | POA: Diagnosis not present

## 2017-09-06 DIAGNOSIS — S81802A Unspecified open wound, left lower leg, initial encounter: Secondary | ICD-10-CM | POA: Diagnosis not present

## 2017-09-06 DIAGNOSIS — I890 Lymphedema, not elsewhere classified: Secondary | ICD-10-CM | POA: Diagnosis not present

## 2017-09-06 DIAGNOSIS — I872 Venous insufficiency (chronic) (peripheral): Secondary | ICD-10-CM | POA: Diagnosis not present

## 2017-09-07 DIAGNOSIS — M791 Myalgia, unspecified site: Secondary | ICD-10-CM | POA: Diagnosis not present

## 2017-09-07 DIAGNOSIS — M5136 Other intervertebral disc degeneration, lumbar region: Secondary | ICD-10-CM | POA: Diagnosis not present

## 2017-09-07 DIAGNOSIS — M47816 Spondylosis without myelopathy or radiculopathy, lumbar region: Secondary | ICD-10-CM | POA: Diagnosis not present

## 2017-09-12 DIAGNOSIS — I872 Venous insufficiency (chronic) (peripheral): Secondary | ICD-10-CM | POA: Diagnosis not present

## 2017-09-12 DIAGNOSIS — I890 Lymphedema, not elsewhere classified: Secondary | ICD-10-CM | POA: Diagnosis not present

## 2017-09-12 DIAGNOSIS — S81802A Unspecified open wound, left lower leg, initial encounter: Secondary | ICD-10-CM | POA: Diagnosis not present

## 2017-09-28 DIAGNOSIS — Z87440 Personal history of urinary (tract) infections: Secondary | ICD-10-CM | POA: Diagnosis not present

## 2017-09-28 DIAGNOSIS — R300 Dysuria: Secondary | ICD-10-CM | POA: Diagnosis not present

## 2017-09-28 DIAGNOSIS — N390 Urinary tract infection, site not specified: Secondary | ICD-10-CM | POA: Diagnosis not present

## 2017-09-29 DIAGNOSIS — Z23 Encounter for immunization: Secondary | ICD-10-CM | POA: Diagnosis not present

## 2017-10-02 DIAGNOSIS — M810 Age-related osteoporosis without current pathological fracture: Secondary | ICD-10-CM | POA: Diagnosis not present

## 2017-10-02 DIAGNOSIS — I10 Essential (primary) hypertension: Secondary | ICD-10-CM | POA: Diagnosis not present

## 2017-10-02 DIAGNOSIS — E890 Postprocedural hypothyroidism: Secondary | ICD-10-CM | POA: Diagnosis not present

## 2017-10-02 DIAGNOSIS — D519 Vitamin B12 deficiency anemia, unspecified: Secondary | ICD-10-CM | POA: Diagnosis not present

## 2017-10-02 DIAGNOSIS — K580 Irritable bowel syndrome with diarrhea: Secondary | ICD-10-CM | POA: Diagnosis not present

## 2017-10-02 DIAGNOSIS — E785 Hyperlipidemia, unspecified: Secondary | ICD-10-CM | POA: Diagnosis not present

## 2017-10-02 DIAGNOSIS — I4891 Unspecified atrial fibrillation: Secondary | ICD-10-CM | POA: Diagnosis not present

## 2017-10-02 DIAGNOSIS — Z6822 Body mass index (BMI) 22.0-22.9, adult: Secondary | ICD-10-CM | POA: Diagnosis not present

## 2017-10-12 DIAGNOSIS — Z1231 Encounter for screening mammogram for malignant neoplasm of breast: Secondary | ICD-10-CM | POA: Diagnosis not present

## 2017-10-12 DIAGNOSIS — Z136 Encounter for screening for cardiovascular disorders: Secondary | ICD-10-CM | POA: Diagnosis not present

## 2017-10-12 DIAGNOSIS — Z0000 Encounter for general adult medical examination without abnormal findings: Secondary | ICD-10-CM | POA: Diagnosis not present

## 2017-10-12 DIAGNOSIS — Z9181 History of falling: Secondary | ICD-10-CM | POA: Diagnosis not present

## 2017-10-12 DIAGNOSIS — E785 Hyperlipidemia, unspecified: Secondary | ICD-10-CM | POA: Diagnosis not present

## 2017-10-12 DIAGNOSIS — Z1331 Encounter for screening for depression: Secondary | ICD-10-CM | POA: Diagnosis not present

## 2017-11-02 DIAGNOSIS — M4848XA Fatigue fracture of vertebra, sacral and sacrococcygeal region, initial encounter for fracture: Secondary | ICD-10-CM | POA: Diagnosis not present

## 2017-11-02 DIAGNOSIS — M47816 Spondylosis without myelopathy or radiculopathy, lumbar region: Secondary | ICD-10-CM | POA: Diagnosis not present

## 2017-11-02 DIAGNOSIS — I10 Essential (primary) hypertension: Secondary | ICD-10-CM | POA: Diagnosis not present

## 2017-11-02 DIAGNOSIS — M5136 Other intervertebral disc degeneration, lumbar region: Secondary | ICD-10-CM | POA: Diagnosis not present

## 2017-11-02 DIAGNOSIS — Z6824 Body mass index (BMI) 24.0-24.9, adult: Secondary | ICD-10-CM | POA: Diagnosis not present

## 2017-11-02 DIAGNOSIS — M791 Myalgia, unspecified site: Secondary | ICD-10-CM | POA: Diagnosis not present

## 2017-11-09 DIAGNOSIS — S3993XA Unspecified injury of pelvis, initial encounter: Secondary | ICD-10-CM | POA: Diagnosis not present

## 2017-11-09 DIAGNOSIS — N390 Urinary tract infection, site not specified: Secondary | ICD-10-CM | POA: Diagnosis not present

## 2017-11-09 DIAGNOSIS — R339 Retention of urine, unspecified: Secondary | ICD-10-CM | POA: Diagnosis not present

## 2017-11-09 DIAGNOSIS — R102 Pelvic and perineal pain: Secondary | ICD-10-CM | POA: Diagnosis not present

## 2017-11-09 DIAGNOSIS — M5136 Other intervertebral disc degeneration, lumbar region: Secondary | ICD-10-CM | POA: Diagnosis not present

## 2017-11-09 DIAGNOSIS — Z87448 Personal history of other diseases of urinary system: Secondary | ICD-10-CM | POA: Diagnosis not present

## 2017-11-16 DIAGNOSIS — E0789 Other specified disorders of thyroid: Secondary | ICD-10-CM | POA: Diagnosis not present

## 2017-11-21 DIAGNOSIS — M545 Low back pain: Secondary | ICD-10-CM | POA: Diagnosis not present

## 2017-11-21 DIAGNOSIS — M791 Myalgia, unspecified site: Secondary | ICD-10-CM | POA: Diagnosis not present

## 2017-11-21 DIAGNOSIS — M5136 Other intervertebral disc degeneration, lumbar region: Secondary | ICD-10-CM | POA: Diagnosis not present

## 2017-11-21 DIAGNOSIS — M47816 Spondylosis without myelopathy or radiculopathy, lumbar region: Secondary | ICD-10-CM | POA: Diagnosis not present

## 2017-12-11 DIAGNOSIS — E890 Postprocedural hypothyroidism: Secondary | ICD-10-CM | POA: Diagnosis not present

## 2017-12-11 DIAGNOSIS — Z85850 Personal history of malignant neoplasm of thyroid: Secondary | ICD-10-CM | POA: Diagnosis not present

## 2017-12-11 DIAGNOSIS — E039 Hypothyroidism, unspecified: Secondary | ICD-10-CM | POA: Diagnosis not present

## 2017-12-18 DIAGNOSIS — R339 Retention of urine, unspecified: Secondary | ICD-10-CM | POA: Diagnosis not present

## 2017-12-18 DIAGNOSIS — Z87448 Personal history of other diseases of urinary system: Secondary | ICD-10-CM | POA: Diagnosis not present

## 2017-12-18 DIAGNOSIS — N390 Urinary tract infection, site not specified: Secondary | ICD-10-CM | POA: Diagnosis not present

## 2017-12-21 DIAGNOSIS — M47816 Spondylosis without myelopathy or radiculopathy, lumbar region: Secondary | ICD-10-CM | POA: Diagnosis not present

## 2017-12-21 DIAGNOSIS — M791 Myalgia, unspecified site: Secondary | ICD-10-CM | POA: Diagnosis not present

## 2018-01-15 DIAGNOSIS — N390 Urinary tract infection, site not specified: Secondary | ICD-10-CM | POA: Diagnosis not present

## 2018-01-15 DIAGNOSIS — R339 Retention of urine, unspecified: Secondary | ICD-10-CM | POA: Diagnosis not present

## 2018-01-15 DIAGNOSIS — Z87448 Personal history of other diseases of urinary system: Secondary | ICD-10-CM | POA: Diagnosis not present

## 2018-01-16 DIAGNOSIS — M47816 Spondylosis without myelopathy or radiculopathy, lumbar region: Secondary | ICD-10-CM | POA: Diagnosis not present

## 2018-01-24 DIAGNOSIS — M810 Age-related osteoporosis without current pathological fracture: Secondary | ICD-10-CM | POA: Diagnosis not present

## 2018-01-29 DIAGNOSIS — Z87448 Personal history of other diseases of urinary system: Secondary | ICD-10-CM | POA: Diagnosis not present

## 2018-01-29 DIAGNOSIS — R339 Retention of urine, unspecified: Secondary | ICD-10-CM | POA: Diagnosis not present

## 2018-01-29 DIAGNOSIS — N390 Urinary tract infection, site not specified: Secondary | ICD-10-CM | POA: Diagnosis not present

## 2018-02-01 DIAGNOSIS — I10 Essential (primary) hypertension: Secondary | ICD-10-CM | POA: Diagnosis not present

## 2018-02-01 DIAGNOSIS — S0501XA Injury of conjunctiva and corneal abrasion without foreign body, right eye, initial encounter: Secondary | ICD-10-CM | POA: Diagnosis not present

## 2018-02-12 DIAGNOSIS — M47816 Spondylosis without myelopathy or radiculopathy, lumbar region: Secondary | ICD-10-CM | POA: Diagnosis not present

## 2018-02-21 ENCOUNTER — Ambulatory Visit (INDEPENDENT_AMBULATORY_CARE_PROVIDER_SITE_OTHER): Payer: Medicare Other | Admitting: Sports Medicine

## 2018-02-21 ENCOUNTER — Encounter: Payer: Self-pay | Admitting: Sports Medicine

## 2018-02-21 DIAGNOSIS — I739 Peripheral vascular disease, unspecified: Secondary | ICD-10-CM | POA: Diagnosis not present

## 2018-02-21 DIAGNOSIS — B351 Tinea unguium: Secondary | ICD-10-CM | POA: Diagnosis not present

## 2018-02-21 DIAGNOSIS — M79609 Pain in unspecified limb: Secondary | ICD-10-CM

## 2018-02-21 NOTE — Progress Notes (Signed)
Subjective: Jean Ruiz is a 83 y.o. female patient seen today in office with complaint of Left great toe>all other toes painful thickened and elongated toenails; unable to trim. Patient denies history of Diabetes or Neuropathy. Reports that she is on a full dose of Asprin for cardioprotective reasons and use to take blood thinners. Patient has no other pedal complaints at this time.   There are no active problems to display for this patient.   Current Outpatient Medications on File Prior to Visit  Medication Sig Dispense Refill  . ciprofloxacin (CIPRO) 500 MG tablet Take 500 mg by mouth 2 (two) times daily.    . cyanocobalamin (,VITAMIN B-12,) 1000 MCG/ML injection INJECT ONE MILLILITER WEEKLY FOR FOUR WEEKS THEN, ONE MILLILITER EVERY MONTH  4  . furosemide (LASIX) 40 MG tablet Take 40 mg by mouth.    . hydrALAZINE (APRESOLINE) 10 MG tablet Take 10 mg by mouth 3 (three) times daily.    . Levothyroxine Sodium (SYNTHROID PO) Take by mouth.    Marland Kitchen lisinopril (PRINIVIL,ZESTRIL) 40 MG tablet     . LORazepam (ATIVAN) 0.5 MG tablet     . metoprolol succinate (TOPROL-XL) 25 MG 24 hr tablet Take 25 mg by mouth 2 (two) times daily.    Marland Kitchen ofloxacin (FLOXIN) 0.3 % OTIC solution 5 drops daily.    . [DISCONTINUED] amLODipine (NORVASC) 5 MG tablet Take 5 mg by mouth.     No current facility-administered medications on file prior to visit.     Allergies  Allergen Reactions  . Ceclor [Cefaclor] Rash  . Sulfur Rash    Objective: Physical Exam  General: Well developed, nourished, no acute distress, awake, alert and oriented x 3  Vascular: Dorsalis pedis artery 1/4 bilateral, Posterior tibial artery 0/4 bilateral, skin temperature warm to cool proximal to distal bilateral lower extremities, 1+ pitting edema at ankles, ++ varicosities with hyperpingmentation, no pedal hair present bilateral.  Neurological: Gross sensation present via light touch bilateral.   Dermatological: Skin is warm, dry, and  supple bilateral, Nails 1-10 are tender, long, thick, and discolored with mild subungal debris with most involved nail left 1st toenail with trauma lines and rams horn appearance, no webspace macerations present bilateral, no open lesions present bilateral, no callus/corns/hyperkeratotic tissue present bilateral. No signs of infection bilateral.  Musculoskeletal: No symptomatic boney deformities noted bilateral. Muscular strength within normal limits without painon range of motion. No pain with calf compression bilateral.  Assessment and Plan:  Problem List Items Addressed This Visit    None    Visit Diagnoses    Pain due to onychomycosis of nail    -  Primary   PVD (peripheral vascular disease) ([place])       Relevant Medications   hydrALAZINE (APRESOLINE) 10 MG tablet   furosemide (LASIX) 40 MG tablet      -Examined patient.  -Discussed treatment options for painful mycotic nails. -At no charge, Mechanically debrided and reduced mycotic nails with sterile nail nipper and dremel nail file without incident. WILL GET ABN SIGNED NEXT VISIT -Advised patient that at this time she is not a good candidate for any type of nail avulsion procedure on the left due to decreased circulation and significant varicosities and edema at ankles.  Advised patient in the future if we ever want to consider a nail procedure she would have to have medical clearance and ABI testing performed prior.  Patient expressed understanding and opts for me to trim her nails today as documented above. -Patient  to return in 3 months for follow up evaluation/nail trim or sooner if symptoms worsen.  Landis Martins, DPM

## 2018-02-28 DIAGNOSIS — S199XXA Unspecified injury of neck, initial encounter: Secondary | ICD-10-CM | POA: Diagnosis not present

## 2018-02-28 DIAGNOSIS — Z7982 Long term (current) use of aspirin: Secondary | ICD-10-CM | POA: Diagnosis not present

## 2018-02-28 DIAGNOSIS — I4891 Unspecified atrial fibrillation: Secondary | ICD-10-CM | POA: Diagnosis not present

## 2018-02-28 DIAGNOSIS — S3992XA Unspecified injury of lower back, initial encounter: Secondary | ICD-10-CM | POA: Diagnosis not present

## 2018-02-28 DIAGNOSIS — M533 Sacrococcygeal disorders, not elsewhere classified: Secondary | ICD-10-CM | POA: Diagnosis not present

## 2018-02-28 DIAGNOSIS — I10 Essential (primary) hypertension: Secondary | ICD-10-CM | POA: Diagnosis not present

## 2018-02-28 DIAGNOSIS — S0101XA Laceration without foreign body of scalp, initial encounter: Secondary | ICD-10-CM | POA: Diagnosis not present

## 2018-02-28 DIAGNOSIS — S0003XA Contusion of scalp, initial encounter: Secondary | ICD-10-CM | POA: Diagnosis not present

## 2018-02-28 DIAGNOSIS — G919 Hydrocephalus, unspecified: Secondary | ICD-10-CM | POA: Diagnosis not present

## 2018-03-06 DIAGNOSIS — M47816 Spondylosis without myelopathy or radiculopathy, lumbar region: Secondary | ICD-10-CM | POA: Diagnosis not present

## 2018-03-21 DIAGNOSIS — R531 Weakness: Secondary | ICD-10-CM | POA: Diagnosis not present

## 2018-03-21 DIAGNOSIS — R7989 Other specified abnormal findings of blood chemistry: Secondary | ICD-10-CM | POA: Diagnosis not present

## 2018-03-21 DIAGNOSIS — R1111 Vomiting without nausea: Secondary | ICD-10-CM | POA: Diagnosis not present

## 2018-03-21 DIAGNOSIS — E876 Hypokalemia: Secondary | ICD-10-CM | POA: Diagnosis not present

## 2018-03-21 DIAGNOSIS — R404 Transient alteration of awareness: Secondary | ICD-10-CM | POA: Diagnosis not present

## 2018-03-21 DIAGNOSIS — R112 Nausea with vomiting, unspecified: Secondary | ICD-10-CM | POA: Diagnosis not present

## 2018-03-22 DIAGNOSIS — I5031 Acute diastolic (congestive) heart failure: Secondary | ICD-10-CM | POA: Diagnosis not present

## 2018-03-22 DIAGNOSIS — R2689 Other abnormalities of gait and mobility: Secondary | ICD-10-CM | POA: Diagnosis not present

## 2018-03-22 DIAGNOSIS — I5032 Chronic diastolic (congestive) heart failure: Secondary | ICD-10-CM | POA: Diagnosis not present

## 2018-03-22 DIAGNOSIS — R2681 Unsteadiness on feet: Secondary | ICD-10-CM | POA: Diagnosis not present

## 2018-03-22 DIAGNOSIS — R112 Nausea with vomiting, unspecified: Secondary | ICD-10-CM | POA: Diagnosis not present

## 2018-03-22 DIAGNOSIS — I517 Cardiomegaly: Secondary | ICD-10-CM | POA: Diagnosis not present

## 2018-03-22 DIAGNOSIS — M1990 Unspecified osteoarthritis, unspecified site: Secondary | ICD-10-CM | POA: Diagnosis present

## 2018-03-22 DIAGNOSIS — I110 Hypertensive heart disease with heart failure: Secondary | ICD-10-CM | POA: Diagnosis not present

## 2018-03-22 DIAGNOSIS — E039 Hypothyroidism, unspecified: Secondary | ICD-10-CM | POA: Diagnosis present

## 2018-03-22 DIAGNOSIS — I10 Essential (primary) hypertension: Secondary | ICD-10-CM | POA: Diagnosis not present

## 2018-03-22 DIAGNOSIS — E876 Hypokalemia: Secondary | ICD-10-CM | POA: Diagnosis not present

## 2018-03-22 DIAGNOSIS — R5383 Other fatigue: Secondary | ICD-10-CM | POA: Diagnosis not present

## 2018-03-22 DIAGNOSIS — E7800 Pure hypercholesterolemia, unspecified: Secondary | ICD-10-CM | POA: Diagnosis present

## 2018-03-22 DIAGNOSIS — Z888 Allergy status to other drugs, medicaments and biological substances status: Secondary | ICD-10-CM | POA: Diagnosis not present

## 2018-03-22 DIAGNOSIS — R278 Other lack of coordination: Secondary | ICD-10-CM | POA: Diagnosis not present

## 2018-03-22 DIAGNOSIS — R7989 Other specified abnormal findings of blood chemistry: Secondary | ICD-10-CM | POA: Diagnosis not present

## 2018-03-22 DIAGNOSIS — I482 Chronic atrial fibrillation: Secondary | ICD-10-CM | POA: Diagnosis not present

## 2018-03-22 DIAGNOSIS — Z79899 Other long term (current) drug therapy: Secondary | ICD-10-CM | POA: Diagnosis not present

## 2018-03-22 DIAGNOSIS — M6281 Muscle weakness (generalized): Secondary | ICD-10-CM | POA: Diagnosis not present

## 2018-03-22 DIAGNOSIS — Z23 Encounter for immunization: Secondary | ICD-10-CM | POA: Diagnosis not present

## 2018-03-22 DIAGNOSIS — R531 Weakness: Secondary | ICD-10-CM | POA: Diagnosis present

## 2018-03-22 DIAGNOSIS — Z882 Allergy status to sulfonamides status: Secondary | ICD-10-CM | POA: Diagnosis not present

## 2018-03-26 DIAGNOSIS — R2681 Unsteadiness on feet: Secondary | ICD-10-CM | POA: Diagnosis not present

## 2018-03-26 DIAGNOSIS — Z23 Encounter for immunization: Secondary | ICD-10-CM | POA: Diagnosis not present

## 2018-03-26 DIAGNOSIS — I5031 Acute diastolic (congestive) heart failure: Secondary | ICD-10-CM | POA: Diagnosis not present

## 2018-03-26 DIAGNOSIS — I4891 Unspecified atrial fibrillation: Secondary | ICD-10-CM | POA: Diagnosis not present

## 2018-03-26 DIAGNOSIS — M6281 Muscle weakness (generalized): Secondary | ICD-10-CM | POA: Diagnosis not present

## 2018-03-26 DIAGNOSIS — E876 Hypokalemia: Secondary | ICD-10-CM | POA: Diagnosis not present

## 2018-03-26 DIAGNOSIS — I10 Essential (primary) hypertension: Secondary | ICD-10-CM | POA: Diagnosis not present

## 2018-03-26 DIAGNOSIS — R05 Cough: Secondary | ICD-10-CM | POA: Diagnosis not present

## 2018-03-26 DIAGNOSIS — N390 Urinary tract infection, site not specified: Secondary | ICD-10-CM | POA: Diagnosis not present

## 2018-03-26 DIAGNOSIS — E7800 Pure hypercholesterolemia, unspecified: Secondary | ICD-10-CM | POA: Diagnosis not present

## 2018-03-26 DIAGNOSIS — I482 Chronic atrial fibrillation: Secondary | ICD-10-CM | POA: Diagnosis not present

## 2018-03-26 DIAGNOSIS — I5033 Acute on chronic diastolic (congestive) heart failure: Secondary | ICD-10-CM | POA: Diagnosis not present

## 2018-03-26 DIAGNOSIS — Z888 Allergy status to other drugs, medicaments and biological substances status: Secondary | ICD-10-CM | POA: Diagnosis not present

## 2018-03-26 DIAGNOSIS — I5032 Chronic diastolic (congestive) heart failure: Secondary | ICD-10-CM | POA: Diagnosis not present

## 2018-03-26 DIAGNOSIS — Z882 Allergy status to sulfonamides status: Secondary | ICD-10-CM | POA: Diagnosis not present

## 2018-03-26 DIAGNOSIS — M1990 Unspecified osteoarthritis, unspecified site: Secondary | ICD-10-CM | POA: Diagnosis not present

## 2018-03-26 DIAGNOSIS — M7989 Other specified soft tissue disorders: Secondary | ICD-10-CM | POA: Diagnosis not present

## 2018-03-26 DIAGNOSIS — R278 Other lack of coordination: Secondary | ICD-10-CM | POA: Diagnosis not present

## 2018-03-26 DIAGNOSIS — R531 Weakness: Secondary | ICD-10-CM | POA: Diagnosis not present

## 2018-03-26 DIAGNOSIS — I110 Hypertensive heart disease with heart failure: Secondary | ICD-10-CM | POA: Diagnosis not present

## 2018-03-26 DIAGNOSIS — R2689 Other abnormalities of gait and mobility: Secondary | ICD-10-CM | POA: Diagnosis not present

## 2018-03-26 DIAGNOSIS — E039 Hypothyroidism, unspecified: Secondary | ICD-10-CM | POA: Diagnosis not present

## 2018-03-26 DIAGNOSIS — R5383 Other fatigue: Secondary | ICD-10-CM | POA: Diagnosis not present

## 2018-03-26 DIAGNOSIS — R112 Nausea with vomiting, unspecified: Secondary | ICD-10-CM | POA: Diagnosis not present

## 2018-03-26 DIAGNOSIS — Z79899 Other long term (current) drug therapy: Secondary | ICD-10-CM | POA: Diagnosis not present

## 2018-03-28 DIAGNOSIS — I4891 Unspecified atrial fibrillation: Secondary | ICD-10-CM | POA: Diagnosis not present

## 2018-03-28 DIAGNOSIS — I5033 Acute on chronic diastolic (congestive) heart failure: Secondary | ICD-10-CM | POA: Diagnosis not present

## 2018-03-28 DIAGNOSIS — R531 Weakness: Secondary | ICD-10-CM | POA: Diagnosis not present

## 2018-03-28 DIAGNOSIS — I110 Hypertensive heart disease with heart failure: Secondary | ICD-10-CM | POA: Diagnosis not present

## 2018-04-01 DIAGNOSIS — N390 Urinary tract infection, site not specified: Secondary | ICD-10-CM | POA: Diagnosis not present

## 2018-04-19 ENCOUNTER — Other Ambulatory Visit: Payer: Self-pay | Admitting: *Deleted

## 2018-04-19 NOTE — Patient Outreach (Signed)
[facility] [facility]) Care Management  04/19/2018  KYNESHA GUERIN 01/28/1935 [id number]   Met with patient at facility 04/04/18 and 04/10/18 to review [facility] care management services with patient and collaborate with [facility].  Patient lives home with spouse, who has his own health issues. They do not have any living children, they have some private pay help for housekeeping and there are a couple of nieces who are assisting patient and spouse. Patient feels she could benefit from [facility] care management and also her spouse.  Patient has a history of CHF, A Fib, HTN, Osteoporosis.  Patient verbally agreed to [facility] care management.  Patient has packet and verbal consent obtained for [facility] care management services upon discharge from SNF.  Notified patient should discharge from facility 04/20/18. Referral for [facility] community care management will be placed.   Plan to refer to [place] for transition of care. Royetta Crochet. Laymond Purser, RN, BSN, [place] [phone]) Business Cell  [phone]) Toll Free Office

## 2018-04-22 DIAGNOSIS — Z9181 History of falling: Secondary | ICD-10-CM | POA: Diagnosis not present

## 2018-04-22 DIAGNOSIS — R531 Weakness: Secondary | ICD-10-CM | POA: Diagnosis not present

## 2018-04-22 DIAGNOSIS — I5033 Acute on chronic diastolic (congestive) heart failure: Secondary | ICD-10-CM | POA: Diagnosis not present

## 2018-04-22 DIAGNOSIS — E039 Hypothyroidism, unspecified: Secondary | ICD-10-CM | POA: Diagnosis not present

## 2018-04-22 DIAGNOSIS — Z7982 Long term (current) use of aspirin: Secondary | ICD-10-CM | POA: Diagnosis not present

## 2018-04-22 DIAGNOSIS — I110 Hypertensive heart disease with heart failure: Secondary | ICD-10-CM | POA: Diagnosis not present

## 2018-04-22 DIAGNOSIS — I4891 Unspecified atrial fibrillation: Secondary | ICD-10-CM | POA: Diagnosis not present

## 2018-04-22 DIAGNOSIS — R296 Repeated falls: Secondary | ICD-10-CM | POA: Diagnosis not present

## 2018-04-23 ENCOUNTER — Other Ambulatory Visit: Payer: Self-pay | Admitting: *Deleted

## 2018-04-23 ENCOUNTER — Encounter: Payer: Self-pay | Admitting: *Deleted

## 2018-04-23 DIAGNOSIS — I5030 Unspecified diastolic (congestive) heart failure: Secondary | ICD-10-CM

## 2018-04-23 DIAGNOSIS — I4891 Unspecified atrial fibrillation: Secondary | ICD-10-CM

## 2018-04-23 DIAGNOSIS — G459 Transient cerebral ischemic attack, unspecified: Secondary | ICD-10-CM

## 2018-04-23 DIAGNOSIS — E785 Hyperlipidemia, unspecified: Secondary | ICD-10-CM

## 2018-04-23 DIAGNOSIS — E039 Hypothyroidism, unspecified: Secondary | ICD-10-CM | POA: Insufficient documentation

## 2018-04-23 DIAGNOSIS — I10 Essential (primary) hypertension: Secondary | ICD-10-CM

## 2018-04-23 NOTE — Patient Outreach (Signed)
[place] [facility]) Care Management  04/23/2018  Jean Ruiz [birth year] [id number]   Transition of care call  Referral received 5/16 Referral source : [facility] liaison Referral reason : [facility]charge from [facility], Insurance : medicare.  Recent history reviewed, patient admitted to [facility] 4/18-4/22, Dx: N/V, weakness, hypokalemia Diastolic heart failure.   Successful outreach call to patient, HIPAA verified.  Explained reason for the call . Patient states she is feeling better, but still weak, pedaling along with my walker, and  Walking is getting better.   Patient denies dizziness, pain. Patient reports [facility]  home health came out for initial visit this and nurse and therapist to visit again on Tuesday.  Patient reports home health to deliver scales for her use daily. Patient reports she still has swelling in her legs, but it has not increased. Patient reports that they eat pretty healthy, not adding salt to food.  Patient was recently discharged from hospital and all medications have been reviewed.Patient reports she used a pill organizer for her daily medications.   Patient discussed they have nieces that call and come by to check on them and bring meals sometime. Patient states they have a cleaning lady once a week.   Patient does not have appointment with PCP scheduled for after discharge.   Assessment  Patient is agreeable to [facility] care management services and transition of care program.   Plan  Will plan follow up call in the next week as part of weekly transition of care, and complete assessments.  Will send PCP barrier letter.  [facility] CM Care Plan Problem One     Most Recent Value  Care Plan Problem One  Recent hospital admission related to weakness, hypokalemia, Diastolic heart failure at risk for readmission   Role Documenting the Problem One  Care Management [place] for Problem One  Active  [facility] Long Term Goal   Patient will  not experience a hospital readmission in the next 31 days   [facility] Long Term Goal Start Date  04/23/18  Interventions for Problem One Long Term Goal  Advised regarding importance of taking medication as prescribed, Discussed worsening signs of heart failure , weight gain of 2 to 3 pounds in a day or 5 in a week, shortness of breath increased swelling , encouraged notifying MD .   [facility] CM Short Term Goal #1   Patient will attend PCP post discharge visit in the next 30 days   [facility] CM Short Term Goal #1 Start Date  04/23/18  Interventions for Short Term Goal #1  Discussed with patient importance of scheduling PCP office visit after dischagre from rehab, for medication review and follow up .   [facility] CM Short Term Goal #2   Over the next 30 days patient will begin to weigh daily and keep a record.   [facility] CM Short Term Goal #2 Start Date  04/23/18  Interventions for Short Term Goal #2  Educated patient on the best time of day to weigh, in morning after going to bathroom ,        [name], RN, [place] Management Coordinator  [phone]- Mobile [phone]- [place] Office

## 2018-04-24 DIAGNOSIS — I4891 Unspecified atrial fibrillation: Secondary | ICD-10-CM | POA: Diagnosis not present

## 2018-04-24 DIAGNOSIS — R296 Repeated falls: Secondary | ICD-10-CM | POA: Diagnosis not present

## 2018-04-24 DIAGNOSIS — I5033 Acute on chronic diastolic (congestive) heart failure: Secondary | ICD-10-CM | POA: Diagnosis not present

## 2018-04-24 DIAGNOSIS — R531 Weakness: Secondary | ICD-10-CM | POA: Diagnosis not present

## 2018-04-24 DIAGNOSIS — I110 Hypertensive heart disease with heart failure: Secondary | ICD-10-CM | POA: Diagnosis not present

## 2018-04-24 DIAGNOSIS — E039 Hypothyroidism, unspecified: Secondary | ICD-10-CM | POA: Diagnosis not present

## 2018-04-26 DIAGNOSIS — R296 Repeated falls: Secondary | ICD-10-CM | POA: Diagnosis not present

## 2018-04-26 DIAGNOSIS — I5033 Acute on chronic diastolic (congestive) heart failure: Secondary | ICD-10-CM | POA: Diagnosis not present

## 2018-04-26 DIAGNOSIS — R531 Weakness: Secondary | ICD-10-CM | POA: Diagnosis not present

## 2018-04-26 DIAGNOSIS — E039 Hypothyroidism, unspecified: Secondary | ICD-10-CM | POA: Diagnosis not present

## 2018-04-26 DIAGNOSIS — I4891 Unspecified atrial fibrillation: Secondary | ICD-10-CM | POA: Diagnosis not present

## 2018-04-26 DIAGNOSIS — I110 Hypertensive heart disease with heart failure: Secondary | ICD-10-CM | POA: Diagnosis not present

## 2018-04-27 DIAGNOSIS — I5033 Acute on chronic diastolic (congestive) heart failure: Secondary | ICD-10-CM | POA: Diagnosis not present

## 2018-04-27 DIAGNOSIS — I110 Hypertensive heart disease with heart failure: Secondary | ICD-10-CM | POA: Diagnosis not present

## 2018-04-27 DIAGNOSIS — I4891 Unspecified atrial fibrillation: Secondary | ICD-10-CM | POA: Diagnosis not present

## 2018-04-27 DIAGNOSIS — R531 Weakness: Secondary | ICD-10-CM | POA: Diagnosis not present

## 2018-04-27 DIAGNOSIS — R296 Repeated falls: Secondary | ICD-10-CM | POA: Diagnosis not present

## 2018-04-27 DIAGNOSIS — E039 Hypothyroidism, unspecified: Secondary | ICD-10-CM | POA: Diagnosis not present

## 2018-05-01 ENCOUNTER — Other Ambulatory Visit: Payer: Self-pay | Admitting: *Deleted

## 2018-05-01 DIAGNOSIS — I110 Hypertensive heart disease with heart failure: Secondary | ICD-10-CM | POA: Diagnosis not present

## 2018-05-01 DIAGNOSIS — E039 Hypothyroidism, unspecified: Secondary | ICD-10-CM | POA: Diagnosis not present

## 2018-05-01 DIAGNOSIS — R531 Weakness: Secondary | ICD-10-CM | POA: Diagnosis not present

## 2018-05-01 DIAGNOSIS — I4891 Unspecified atrial fibrillation: Secondary | ICD-10-CM | POA: Diagnosis not present

## 2018-05-01 DIAGNOSIS — R296 Repeated falls: Secondary | ICD-10-CM | POA: Diagnosis not present

## 2018-05-01 DIAGNOSIS — I5033 Acute on chronic diastolic (congestive) heart failure: Secondary | ICD-10-CM | POA: Diagnosis not present

## 2018-05-01 NOTE — Patient Outreach (Signed)
[facility] [facility]) Care Management  05/01/2018  Jean Ruiz February 23, 1935 [id number]   Referral received 5/16 Referral source : [facility] liaison Referral reason : [facility]charge from [facility] on [facility], Insurance : medicare.  Recent history reviewed, patient admitted to [facility] 4/18-4/22, Dx: N/V, weakness, hypokalemia Diastolic heart failure.   Successful telephone outreach call, HIPPA verified.Patient states she is still weak, tolerating walking in home using rolling walker.  Patient discussed being active with home health physical therapy and working on exercises, reports therapy working with her twice weekly. Patient discussed her decreased appetite, she just hasn't been able to get it back yet, discussed she has lost about 15 lbs in the last 2 months Patient states she has tried ensure while in the rehab but didn't like that she has some carnation instant breakfast and will try that, she states home health is sending someone to her home to talk about nutrition on Friday.   Patient discussed she has not scheduled PCP post discharge visit but she will, reinforced importance of attending visit.    Plan  Will continue weekly telephone outreaches, next call in a week, will schedule initial home visit at that time, patient requested another week prior to home visit.   [name], RN, [place] Management Coordinator  [phone]- Mobile [phone]- Toll Free Main Office

## 2018-05-02 DIAGNOSIS — R531 Weakness: Secondary | ICD-10-CM | POA: Diagnosis not present

## 2018-05-02 DIAGNOSIS — I5033 Acute on chronic diastolic (congestive) heart failure: Secondary | ICD-10-CM | POA: Diagnosis not present

## 2018-05-02 DIAGNOSIS — I4891 Unspecified atrial fibrillation: Secondary | ICD-10-CM | POA: Diagnosis not present

## 2018-05-02 DIAGNOSIS — E039 Hypothyroidism, unspecified: Secondary | ICD-10-CM | POA: Diagnosis not present

## 2018-05-02 DIAGNOSIS — R296 Repeated falls: Secondary | ICD-10-CM | POA: Diagnosis not present

## 2018-05-02 DIAGNOSIS — I110 Hypertensive heart disease with heart failure: Secondary | ICD-10-CM | POA: Diagnosis not present

## 2018-05-03 DIAGNOSIS — I4891 Unspecified atrial fibrillation: Secondary | ICD-10-CM | POA: Diagnosis not present

## 2018-05-03 DIAGNOSIS — I5033 Acute on chronic diastolic (congestive) heart failure: Secondary | ICD-10-CM | POA: Diagnosis not present

## 2018-05-03 DIAGNOSIS — I110 Hypertensive heart disease with heart failure: Secondary | ICD-10-CM | POA: Diagnosis not present

## 2018-05-03 DIAGNOSIS — R531 Weakness: Secondary | ICD-10-CM | POA: Diagnosis not present

## 2018-05-03 DIAGNOSIS — R296 Repeated falls: Secondary | ICD-10-CM | POA: Diagnosis not present

## 2018-05-03 DIAGNOSIS — E039 Hypothyroidism, unspecified: Secondary | ICD-10-CM | POA: Diagnosis not present

## 2018-05-04 DIAGNOSIS — R296 Repeated falls: Secondary | ICD-10-CM | POA: Diagnosis not present

## 2018-05-04 DIAGNOSIS — I5033 Acute on chronic diastolic (congestive) heart failure: Secondary | ICD-10-CM | POA: Diagnosis not present

## 2018-05-04 DIAGNOSIS — R531 Weakness: Secondary | ICD-10-CM | POA: Diagnosis not present

## 2018-05-04 DIAGNOSIS — I4891 Unspecified atrial fibrillation: Secondary | ICD-10-CM | POA: Diagnosis not present

## 2018-05-04 DIAGNOSIS — I110 Hypertensive heart disease with heart failure: Secondary | ICD-10-CM | POA: Diagnosis not present

## 2018-05-04 DIAGNOSIS — E039 Hypothyroidism, unspecified: Secondary | ICD-10-CM | POA: Diagnosis not present

## 2018-05-07 ENCOUNTER — Other Ambulatory Visit: Payer: Self-pay | Admitting: *Deleted

## 2018-05-07 ENCOUNTER — Encounter: Payer: Self-pay | Admitting: *Deleted

## 2018-05-07 DIAGNOSIS — D519 Vitamin B12 deficiency anemia, unspecified: Secondary | ICD-10-CM | POA: Diagnosis not present

## 2018-05-07 DIAGNOSIS — E876 Hypokalemia: Secondary | ICD-10-CM | POA: Diagnosis not present

## 2018-05-07 DIAGNOSIS — E039 Hypothyroidism, unspecified: Secondary | ICD-10-CM | POA: Diagnosis not present

## 2018-05-07 DIAGNOSIS — I4891 Unspecified atrial fibrillation: Secondary | ICD-10-CM | POA: Diagnosis not present

## 2018-05-07 DIAGNOSIS — I5033 Acute on chronic diastolic (congestive) heart failure: Secondary | ICD-10-CM | POA: Diagnosis not present

## 2018-05-07 DIAGNOSIS — R112 Nausea with vomiting, unspecified: Secondary | ICD-10-CM | POA: Diagnosis not present

## 2018-05-07 DIAGNOSIS — I5031 Acute diastolic (congestive) heart failure: Secondary | ICD-10-CM | POA: Diagnosis not present

## 2018-05-07 DIAGNOSIS — R296 Repeated falls: Secondary | ICD-10-CM | POA: Diagnosis not present

## 2018-05-07 DIAGNOSIS — R531 Weakness: Secondary | ICD-10-CM | POA: Diagnosis not present

## 2018-05-07 DIAGNOSIS — I110 Hypertensive heart disease with heart failure: Secondary | ICD-10-CM | POA: Diagnosis not present

## 2018-05-07 NOTE — Patient Outreach (Signed)
[facility]) Care Management  05/07/2018  Jean Ruiz 03/17/1935 [id number]  Transition of care call  Successful telephone outreach call to patient , HIPAA verified patient discussed she has had a busy morning , home health therapy visits on today. Patient reports she is tired and it takes a lot out of her and she has to rest between activities. Patient denies increase in swelling or shortness of breath, she is unable to states he her weight for today but she endorses she continues to weigh.   Patient reports she has visit with MD on this afternoon, first visit since discharged from SNF.   Patient denies any new concerns at this time.   Plan  Will continue with weekly transition of care outreaches and patient agreeable to return call this week to view her schedule for initial [facility] care management home visit to allow her time to look at schedule . Patient has post discharge visit with PCP on today.   [name], RN, [place] Management Coordinator  [phone]- Mobile [phone]- Toll Free Main Office

## 2018-05-08 DIAGNOSIS — I4891 Unspecified atrial fibrillation: Secondary | ICD-10-CM | POA: Diagnosis not present

## 2018-05-08 DIAGNOSIS — R296 Repeated falls: Secondary | ICD-10-CM | POA: Diagnosis not present

## 2018-05-08 DIAGNOSIS — I5033 Acute on chronic diastolic (congestive) heart failure: Secondary | ICD-10-CM | POA: Diagnosis not present

## 2018-05-08 DIAGNOSIS — I110 Hypertensive heart disease with heart failure: Secondary | ICD-10-CM | POA: Diagnosis not present

## 2018-05-08 DIAGNOSIS — E039 Hypothyroidism, unspecified: Secondary | ICD-10-CM | POA: Diagnosis not present

## 2018-05-08 DIAGNOSIS — R531 Weakness: Secondary | ICD-10-CM | POA: Diagnosis not present

## 2018-05-09 DIAGNOSIS — R296 Repeated falls: Secondary | ICD-10-CM | POA: Diagnosis not present

## 2018-05-09 DIAGNOSIS — R531 Weakness: Secondary | ICD-10-CM | POA: Diagnosis not present

## 2018-05-09 DIAGNOSIS — I5033 Acute on chronic diastolic (congestive) heart failure: Secondary | ICD-10-CM | POA: Diagnosis not present

## 2018-05-09 DIAGNOSIS — I4891 Unspecified atrial fibrillation: Secondary | ICD-10-CM | POA: Diagnosis not present

## 2018-05-09 DIAGNOSIS — E039 Hypothyroidism, unspecified: Secondary | ICD-10-CM | POA: Diagnosis not present

## 2018-05-09 DIAGNOSIS — I110 Hypertensive heart disease with heart failure: Secondary | ICD-10-CM | POA: Diagnosis not present

## 2018-05-14 ENCOUNTER — Ambulatory Visit: Payer: Medicare Other | Admitting: *Deleted

## 2018-05-14 DIAGNOSIS — R296 Repeated falls: Secondary | ICD-10-CM | POA: Diagnosis not present

## 2018-05-14 DIAGNOSIS — I5033 Acute on chronic diastolic (congestive) heart failure: Secondary | ICD-10-CM | POA: Diagnosis not present

## 2018-05-14 DIAGNOSIS — I110 Hypertensive heart disease with heart failure: Secondary | ICD-10-CM | POA: Diagnosis not present

## 2018-05-14 DIAGNOSIS — E039 Hypothyroidism, unspecified: Secondary | ICD-10-CM | POA: Diagnosis not present

## 2018-05-14 DIAGNOSIS — I4891 Unspecified atrial fibrillation: Secondary | ICD-10-CM | POA: Diagnosis not present

## 2018-05-14 DIAGNOSIS — R531 Weakness: Secondary | ICD-10-CM | POA: Diagnosis not present

## 2018-05-15 ENCOUNTER — Other Ambulatory Visit: Payer: Self-pay | Admitting: *Deleted

## 2018-05-15 DIAGNOSIS — R296 Repeated falls: Secondary | ICD-10-CM | POA: Diagnosis not present

## 2018-05-15 DIAGNOSIS — I110 Hypertensive heart disease with heart failure: Secondary | ICD-10-CM | POA: Diagnosis not present

## 2018-05-15 DIAGNOSIS — E039 Hypothyroidism, unspecified: Secondary | ICD-10-CM | POA: Diagnosis not present

## 2018-05-15 DIAGNOSIS — I4891 Unspecified atrial fibrillation: Secondary | ICD-10-CM | POA: Diagnosis not present

## 2018-05-15 DIAGNOSIS — R531 Weakness: Secondary | ICD-10-CM | POA: Diagnosis not present

## 2018-05-15 DIAGNOSIS — E876 Hypokalemia: Secondary | ICD-10-CM | POA: Diagnosis not present

## 2018-05-15 DIAGNOSIS — I5033 Acute on chronic diastolic (congestive) heart failure: Secondary | ICD-10-CM | POA: Diagnosis not present

## 2018-05-15 NOTE — Patient Outreach (Signed)
[place] [facility]) Care Management   05/16/2018  Jean Ruiz May 23, 1935 [id number]  Jean Ruiz is an 83 y.o. female   Referral received 5/16 Referral source : [facility] liaison Referral reason : [facility]charge [facility], Insurance : medicare.  Recent history reviewed, patient admitted to [facility] 4/18-4/22, Dx: N/V, weakness, hypokalemia Diastolic heart failure,chronic atrial fib.     Subjective:  Patient states I believe that I am getting stronger each week. Patient discussed she has final home health OT visit on tomorrow and physical therapy will continue 3 more weeks to work on her balance.   Patient discussed having office visit on today to check her potassium level.   Patient report she continues to watch the salt in her diet, but they have never really added salt to foods. Patient states she daily salt limit is 1 tsp. Patient discussed daily monitoring of weights and blood pressure with [facility] home health tele monitoring  System.    Objective:  BP 122/78 (BP Location: Right Arm, Patient Position: Sitting, Cuff Size: Small)   Pulse 68   Resp 18   Ht 1.651 m (_0 )   Wt 127 lb 8 oz (57.8 kg)   SpO2 98%   BMI 21.22 kg/m  Review of Systems  Constitutional: Negative.   HENT: Negative.   Eyes: Negative.   Respiratory: Negative.   Cardiovascular: Positive for leg swelling.  Gastrointestinal: Negative.   Genitourinary: Negative.   Musculoskeletal: Negative.   Skin: Negative.   Neurological: Negative.   Endo/Heme/Allergies: Bruises/bleeds easily.  Psychiatric/Behavioral: Negative.     Physical Exam  Constitutional: She is oriented to person, place, and time. She appears well-developed and well-nourished.  Cardiovascular: Normal rate and normal heart sounds.  Respiratory: Effort normal.  GI: Soft. Bowel sounds are normal.  Neurological: She is alert and oriented to person, place, and time.  Skin: Skin is warm and dry.   Psychiatric: She has a normal mood and affect. Her behavior is normal. Judgment and thought content normal.    Encounter Medications:   Outpatient Encounter Medications as of 05/15/2018  Medication Sig Note  . aspirin 325 MG EC tablet Take 325 mg by mouth daily.   . cyanocobalamin (,VITAMIN B-12,) 1000 MCG/ML injection INJECT ONE MILLILITER WEEKLY FOR FOUR WEEKS THEN, ONE MILLILITER EVERY MONTH 04/04/2016: Received from: External Pharmacy  . furosemide (LASIX) 40 MG tablet Take 40 mg by mouth 2 (two) times daily.    . hydrALAZINE (APRESOLINE) 10 MG tablet Take 10 mg by mouth daily.    . Levothyroxine Sodium (SYNTHROID PO) Take 125 mcg/day by mouth daily.    Marland Kitchen lisinopril (PRINIVIL,ZESTRIL) 40 MG tablet 40 mg daily.  04/04/2016: Received from: External Pharmacy  . LORazepam (ATIVAN) 0.5 MG tablet Take 0.5 mg by mouth daily as needed.  04/04/2016: Received from: External Pharmacy  . metoprolol succinate (TOPROL-XL) 25 MG 24 hr tablet Take 100 mg by mouth 2 (two) times daily.    . potassium chloride SA (K-DUR,KLOR-CON) 20 MEQ tablet Take 20 mEq by mouth daily.   . ciprofloxacin (CIPRO) 500 MG tablet Take 500 mg by mouth 2 (two) times daily.   Marland Kitchen ofloxacin (FLOXIN) 0.3 % OTIC solution 5 drops daily.   . [DISCONTINUED] amLODipine (NORVASC) 5 MG tablet Take 5 mg by mouth. 04/04/2016: Received from: [facility]   No facility-administered encounter medications on file as of 05/15/2018.     Functional Status:   In your present state of health, do  you have any difficulty performing the following activities: 04/23/2018  Hearing? N  Vision? N  Difficulty concentrating or making decisions? N  Walking or climbing stairs? Y  Comment using a walker now and home PT  Dressing or bathing? N  Doing errands, shopping? Y  Comment family assist   Preparing Food and eating ? Y  Comment family assisting   Using the Toilet? N  In the past six months, have you accidently leaked urine? N  Do you have problems with  loss of bowel control? N  Managing your Medications? N  Managing your Finances? N  Housekeeping or managing your Housekeeping? Y  Comment has hired help once weekly  Some recent data might be hidden    Fall/Depression Screening:    Fall Risk  05/15/2018 04/23/2018  Falls in the past year? - Yes  Number falls in past yr: - 1  Injury with Fall? - No  Risk for fall due to : Impaired balance/gait History of fall(s)   PHQ 2/9 Scores 04/23/2018  PHQ - 2 Score 0    Assessment:  Initial home visit , patient husband present .   Diastolic heart failure- decreasing swelling to legs,limiting salt in diet ,patient has not experienced sudden with gain weight staying in 127.5 to 128 range, with decreasing in swelling in lower legs. will benefit with continuing education and support of chronic condition education.  Fall risk - high , improving in strength, continues with balance concerns and working with home health PT.  Nutrition - reports some weight loss after stay at rehab, drinking boost daily in addition to meals.    Plan:  Welcome packet provided and reviewed, fall prevention education reviewed.  Provided [facility] calendar, Living with heart failure book.  Will send PCP visit note.    [facility] CM Care Plan Problem One     Most Recent Value  Care Plan Problem One  Recent hospital admission related to weakness, hypokalemia, Diastolic heart failure at risk for readmission   Role Documenting the Problem One  Care Management [place] for Problem One  Active  [facility] Long Term Goal   Patient will not experience a hospital readmission in the next 31 days   [facility] Long Term Goal Start Date  04/23/18  Interventions for Problem One Long Term Goal  provided and reviewed [facility] living with heart failure book, reinforced importance of taking medications, weighing daily, knowing how she feels, following low salt diet, reviewed daily limits, provided hi/lo salt diet handout with review.    [facility] CM Short Term  Goal #1   Patient will attend PCP post discharge visit in the next 30 days   [facility] CM Short Term Goal #1 Start Date  04/23/18  [facility] CM Short Term Goal #1 Met Date  05/16/18  [facility] CM Short Term Goal #2   Over the next 30 days patient will begin to weigh daily and keep a record.   [facility] CM Short Term Goal #2 Start Date  04/23/18  Interventions for Short Term Goal #2  Provided with [facility] calendar and explanation of documentation , on daily , reinforced importance of keeping a log , for easier recall of of weight changes.   [facility] CM Short Term Goal #3  Patient will be able to report increase in strength and balance over the next 30 days   [facility] CM Short Term Goal #3 Start Date  05/01/18  Interventions for Short Tern Goal #3  Reinforced continuing exercises provided by  therapy, encouraged to continue to use assist devices for mobility   [facility] CM Short Term Goal #4  Patient will be able to state at least 3 symptoms of worsening heart failure over the next 30 days   [facility] CM Short Term Goal #4 Start Date  05/15/18  Interventions for Short Term Goal #4  Reviewed heart failure zone charts      [name], RN, [place] Management Coordinator  [phone]- Mobile [phone]- [place]

## 2018-05-16 DIAGNOSIS — R296 Repeated falls: Secondary | ICD-10-CM | POA: Diagnosis not present

## 2018-05-16 DIAGNOSIS — E039 Hypothyroidism, unspecified: Secondary | ICD-10-CM | POA: Diagnosis not present

## 2018-05-16 DIAGNOSIS — R531 Weakness: Secondary | ICD-10-CM | POA: Diagnosis not present

## 2018-05-16 DIAGNOSIS — I110 Hypertensive heart disease with heart failure: Secondary | ICD-10-CM | POA: Diagnosis not present

## 2018-05-16 DIAGNOSIS — I4891 Unspecified atrial fibrillation: Secondary | ICD-10-CM | POA: Diagnosis not present

## 2018-05-16 DIAGNOSIS — I5033 Acute on chronic diastolic (congestive) heart failure: Secondary | ICD-10-CM | POA: Diagnosis not present

## 2018-05-17 DIAGNOSIS — I110 Hypertensive heart disease with heart failure: Secondary | ICD-10-CM | POA: Diagnosis not present

## 2018-05-17 DIAGNOSIS — I4891 Unspecified atrial fibrillation: Secondary | ICD-10-CM | POA: Diagnosis not present

## 2018-05-17 DIAGNOSIS — E039 Hypothyroidism, unspecified: Secondary | ICD-10-CM | POA: Diagnosis not present

## 2018-05-17 DIAGNOSIS — R531 Weakness: Secondary | ICD-10-CM | POA: Diagnosis not present

## 2018-05-17 DIAGNOSIS — I5033 Acute on chronic diastolic (congestive) heart failure: Secondary | ICD-10-CM | POA: Diagnosis not present

## 2018-05-17 DIAGNOSIS — R296 Repeated falls: Secondary | ICD-10-CM | POA: Diagnosis not present

## 2018-05-21 DIAGNOSIS — I4891 Unspecified atrial fibrillation: Secondary | ICD-10-CM | POA: Diagnosis not present

## 2018-05-21 DIAGNOSIS — R531 Weakness: Secondary | ICD-10-CM | POA: Diagnosis not present

## 2018-05-21 DIAGNOSIS — E039 Hypothyroidism, unspecified: Secondary | ICD-10-CM | POA: Diagnosis not present

## 2018-05-21 DIAGNOSIS — I5033 Acute on chronic diastolic (congestive) heart failure: Secondary | ICD-10-CM | POA: Diagnosis not present

## 2018-05-21 DIAGNOSIS — I110 Hypertensive heart disease with heart failure: Secondary | ICD-10-CM | POA: Diagnosis not present

## 2018-05-21 DIAGNOSIS — R296 Repeated falls: Secondary | ICD-10-CM | POA: Diagnosis not present

## 2018-05-23 ENCOUNTER — Ambulatory Visit: Payer: Medicare Other | Admitting: Sports Medicine

## 2018-05-23 DIAGNOSIS — I4891 Unspecified atrial fibrillation: Secondary | ICD-10-CM | POA: Diagnosis not present

## 2018-05-23 DIAGNOSIS — I5033 Acute on chronic diastolic (congestive) heart failure: Secondary | ICD-10-CM | POA: Diagnosis not present

## 2018-05-23 DIAGNOSIS — R531 Weakness: Secondary | ICD-10-CM | POA: Diagnosis not present

## 2018-05-23 DIAGNOSIS — E039 Hypothyroidism, unspecified: Secondary | ICD-10-CM | POA: Diagnosis not present

## 2018-05-23 DIAGNOSIS — I110 Hypertensive heart disease with heart failure: Secondary | ICD-10-CM | POA: Diagnosis not present

## 2018-05-23 DIAGNOSIS — R296 Repeated falls: Secondary | ICD-10-CM | POA: Diagnosis not present

## 2018-05-25 ENCOUNTER — Other Ambulatory Visit: Payer: Self-pay | Admitting: *Deleted

## 2018-05-25 NOTE — Patient Outreach (Signed)
[place] [facility]) Care Management  05/25/2018  Jean Ruiz 1935/10/09 [id number]  Final transition of care call   Referral received 5/16 Referral source : [facility] liaison Referral reason : Discharge from [facility]/7, Insurance : medicare.  Recent history reviewed, patient admitted to [facility] 4/18-4/22, Dx: N/V, weakness, hypokalemia Diastolic heart failure.    UnSuccessful outreach call to patient, able to leave a HIPAA compliant message for return call  Plan  Will plan follow up call in the next week if no return response.    [name], RN, [place] Management Coordinator  [phone]- Mobile [phone]- Toll Free Main Office

## 2018-05-28 DIAGNOSIS — I5033 Acute on chronic diastolic (congestive) heart failure: Secondary | ICD-10-CM | POA: Diagnosis not present

## 2018-05-28 DIAGNOSIS — E039 Hypothyroidism, unspecified: Secondary | ICD-10-CM | POA: Diagnosis not present

## 2018-05-28 DIAGNOSIS — I4891 Unspecified atrial fibrillation: Secondary | ICD-10-CM | POA: Diagnosis not present

## 2018-05-28 DIAGNOSIS — M5416 Radiculopathy, lumbar region: Secondary | ICD-10-CM | POA: Diagnosis not present

## 2018-05-28 DIAGNOSIS — R202 Paresthesia of skin: Secondary | ICD-10-CM | POA: Diagnosis not present

## 2018-05-28 DIAGNOSIS — I110 Hypertensive heart disease with heart failure: Secondary | ICD-10-CM | POA: Diagnosis not present

## 2018-05-28 DIAGNOSIS — R296 Repeated falls: Secondary | ICD-10-CM | POA: Diagnosis not present

## 2018-05-28 DIAGNOSIS — R531 Weakness: Secondary | ICD-10-CM | POA: Diagnosis not present

## 2018-05-30 ENCOUNTER — Other Ambulatory Visit: Payer: Self-pay | Admitting: *Deleted

## 2018-05-30 DIAGNOSIS — I110 Hypertensive heart disease with heart failure: Secondary | ICD-10-CM | POA: Diagnosis not present

## 2018-05-30 DIAGNOSIS — I5033 Acute on chronic diastolic (congestive) heart failure: Secondary | ICD-10-CM | POA: Diagnosis not present

## 2018-05-30 DIAGNOSIS — R296 Repeated falls: Secondary | ICD-10-CM | POA: Diagnosis not present

## 2018-05-30 DIAGNOSIS — I4891 Unspecified atrial fibrillation: Secondary | ICD-10-CM | POA: Diagnosis not present

## 2018-05-30 DIAGNOSIS — R531 Weakness: Secondary | ICD-10-CM | POA: Diagnosis not present

## 2018-05-30 DIAGNOSIS — E039 Hypothyroidism, unspecified: Secondary | ICD-10-CM | POA: Diagnosis not present

## 2018-05-30 NOTE — Patient Outreach (Signed)
[place] [facility]) Care Management  05/30/2018  Jean Ruiz [birth year] [id number]   2nd call attempt   Unsuccessful telephone outreach call, able to leave a HIPAA compliant message for return call.    Plan  Will send unsuccessful outreach letter and schedule 3rd call on day #7, July 1.   .    [name], RN, [place] Management Coordinator  [phone]- Mobile [phone]- [place] Office

## 2018-05-31 DIAGNOSIS — M810 Age-related osteoporosis without current pathological fracture: Secondary | ICD-10-CM | POA: Diagnosis not present

## 2018-06-04 ENCOUNTER — Other Ambulatory Visit: Payer: Self-pay | Admitting: *Deleted

## 2018-06-04 DIAGNOSIS — Z85850 Personal history of malignant neoplasm of thyroid: Secondary | ICD-10-CM | POA: Diagnosis not present

## 2018-06-04 DIAGNOSIS — E039 Hypothyroidism, unspecified: Secondary | ICD-10-CM | POA: Diagnosis not present

## 2018-06-04 NOTE — Patient Outreach (Signed)
[place] [facility]) Care Management  06/04/2018  Jean Ruiz Jul 23, 1935 [id number]   Telephone assessment  #3rd call attempt   Referral received 5/16 Referral source : [facility] liaison Referral reason : [facility]charge from [facility] 5/[facility], Insurance : medicare.  Recent history reviewed, patient admitted to [facility] 4/18-4/22, Dx: N/V, weakness, hypokalemia Diastolic heart failure.    UnSuccessful outreach call to patient, able to leave a HIPAA compliant message for return call  Have not been able to make contact with patient, since initial home visit, unsuccessful contact on 6/21, 6/26 and today.   Plan  Will await return call from patient , if no response will place on schedule at day#10 for case closure no response from outreach letter and 3 unsuccessful call attempts.    [name], RN, [place] Management Coordinator  [phone]- Mobile [phone]- Toll Free Main Office

## 2018-06-06 DIAGNOSIS — I4891 Unspecified atrial fibrillation: Secondary | ICD-10-CM | POA: Diagnosis not present

## 2018-06-06 DIAGNOSIS — I110 Hypertensive heart disease with heart failure: Secondary | ICD-10-CM | POA: Diagnosis not present

## 2018-06-06 DIAGNOSIS — I5033 Acute on chronic diastolic (congestive) heart failure: Secondary | ICD-10-CM | POA: Diagnosis not present

## 2018-06-06 DIAGNOSIS — R296 Repeated falls: Secondary | ICD-10-CM | POA: Diagnosis not present

## 2018-06-06 DIAGNOSIS — R531 Weakness: Secondary | ICD-10-CM | POA: Diagnosis not present

## 2018-06-06 DIAGNOSIS — E039 Hypothyroidism, unspecified: Secondary | ICD-10-CM | POA: Diagnosis not present

## 2018-06-08 ENCOUNTER — Other Ambulatory Visit: Payer: Self-pay | Admitting: *Deleted

## 2018-06-08 DIAGNOSIS — I5033 Acute on chronic diastolic (congestive) heart failure: Secondary | ICD-10-CM | POA: Diagnosis not present

## 2018-06-08 DIAGNOSIS — I110 Hypertensive heart disease with heart failure: Secondary | ICD-10-CM | POA: Diagnosis not present

## 2018-06-08 DIAGNOSIS — R531 Weakness: Secondary | ICD-10-CM | POA: Diagnosis not present

## 2018-06-08 DIAGNOSIS — E039 Hypothyroidism, unspecified: Secondary | ICD-10-CM | POA: Diagnosis not present

## 2018-06-08 DIAGNOSIS — I4891 Unspecified atrial fibrillation: Secondary | ICD-10-CM | POA: Diagnosis not present

## 2018-06-08 DIAGNOSIS — R296 Repeated falls: Secondary | ICD-10-CM | POA: Diagnosis not present

## 2018-06-08 NOTE — Patient Outreach (Signed)
[place] [facility]) Care Management  06/08/2018  GRICELDA FOLAND [birth year] [id number]  Call attempt #3  Unsuccessful call attempt to patient x 3, no response from unsuccessful outreach letter.    Plan Will close case, per workflow , unable to maintain contact. Will send MD case closure letter  Will send patient case closure letter.   [name], RN, [place] Management Coordinator  [phone]- Mobile [phone]- Toll Free Main Office

## 2018-06-11 DIAGNOSIS — E890 Postprocedural hypothyroidism: Secondary | ICD-10-CM | POA: Diagnosis not present

## 2018-06-11 DIAGNOSIS — Z85850 Personal history of malignant neoplasm of thyroid: Secondary | ICD-10-CM | POA: Diagnosis not present

## 2018-06-11 DIAGNOSIS — E039 Hypothyroidism, unspecified: Secondary | ICD-10-CM | POA: Diagnosis not present

## 2018-06-12 DIAGNOSIS — I5033 Acute on chronic diastolic (congestive) heart failure: Secondary | ICD-10-CM | POA: Diagnosis not present

## 2018-06-12 DIAGNOSIS — I4891 Unspecified atrial fibrillation: Secondary | ICD-10-CM | POA: Diagnosis not present

## 2018-06-12 DIAGNOSIS — I110 Hypertensive heart disease with heart failure: Secondary | ICD-10-CM | POA: Diagnosis not present

## 2018-06-12 DIAGNOSIS — R296 Repeated falls: Secondary | ICD-10-CM | POA: Diagnosis not present

## 2018-06-12 DIAGNOSIS — E039 Hypothyroidism, unspecified: Secondary | ICD-10-CM | POA: Diagnosis not present

## 2018-06-12 DIAGNOSIS — R531 Weakness: Secondary | ICD-10-CM | POA: Diagnosis not present

## 2018-06-13 DIAGNOSIS — D519 Vitamin B12 deficiency anemia, unspecified: Secondary | ICD-10-CM | POA: Diagnosis not present

## 2018-06-13 DIAGNOSIS — E876 Hypokalemia: Secondary | ICD-10-CM | POA: Diagnosis not present

## 2018-06-14 DIAGNOSIS — E039 Hypothyroidism, unspecified: Secondary | ICD-10-CM | POA: Diagnosis not present

## 2018-06-14 DIAGNOSIS — I4891 Unspecified atrial fibrillation: Secondary | ICD-10-CM | POA: Diagnosis not present

## 2018-06-14 DIAGNOSIS — I5033 Acute on chronic diastolic (congestive) heart failure: Secondary | ICD-10-CM | POA: Diagnosis not present

## 2018-06-14 DIAGNOSIS — I110 Hypertensive heart disease with heart failure: Secondary | ICD-10-CM | POA: Diagnosis not present

## 2018-06-14 DIAGNOSIS — R296 Repeated falls: Secondary | ICD-10-CM | POA: Diagnosis not present

## 2018-06-14 DIAGNOSIS — R531 Weakness: Secondary | ICD-10-CM | POA: Diagnosis not present

## 2018-06-21 ENCOUNTER — Ambulatory Visit: Payer: Medicare Other | Admitting: Sports Medicine

## 2018-06-21 DIAGNOSIS — M47816 Spondylosis without myelopathy or radiculopathy, lumbar region: Secondary | ICD-10-CM | POA: Diagnosis not present

## 2018-06-21 DIAGNOSIS — M4316 Spondylolisthesis, lumbar region: Secondary | ICD-10-CM | POA: Diagnosis not present

## 2018-06-22 ENCOUNTER — Ambulatory Visit: Payer: Medicare Other | Admitting: Sports Medicine

## 2018-06-27 ENCOUNTER — Ambulatory Visit: Payer: Medicare Other | Admitting: Sports Medicine

## 2018-07-02 DIAGNOSIS — N390 Urinary tract infection, site not specified: Secondary | ICD-10-CM | POA: Diagnosis not present

## 2018-07-02 DIAGNOSIS — R339 Retention of urine, unspecified: Secondary | ICD-10-CM | POA: Diagnosis not present

## 2018-07-02 DIAGNOSIS — Z87448 Personal history of other diseases of urinary system: Secondary | ICD-10-CM | POA: Diagnosis not present

## 2018-07-03 DIAGNOSIS — Z6822 Body mass index (BMI) 22.0-22.9, adult: Secondary | ICD-10-CM | POA: Diagnosis not present

## 2018-07-03 DIAGNOSIS — M25511 Pain in right shoulder: Secondary | ICD-10-CM | POA: Diagnosis not present

## 2018-07-03 DIAGNOSIS — Z1231 Encounter for screening mammogram for malignant neoplasm of breast: Secondary | ICD-10-CM | POA: Diagnosis not present

## 2018-07-04 DIAGNOSIS — M7541 Impingement syndrome of right shoulder: Secondary | ICD-10-CM | POA: Diagnosis not present

## 2018-07-04 DIAGNOSIS — M25511 Pain in right shoulder: Secondary | ICD-10-CM | POA: Diagnosis not present

## 2018-07-10 DIAGNOSIS — M25611 Stiffness of right shoulder, not elsewhere classified: Secondary | ICD-10-CM | POA: Diagnosis not present

## 2018-07-10 DIAGNOSIS — M25511 Pain in right shoulder: Secondary | ICD-10-CM | POA: Diagnosis not present

## 2018-07-11 ENCOUNTER — Ambulatory Visit (INDEPENDENT_AMBULATORY_CARE_PROVIDER_SITE_OTHER): Payer: Medicare Other | Admitting: Sports Medicine

## 2018-07-11 ENCOUNTER — Encounter: Payer: Self-pay | Admitting: Sports Medicine

## 2018-07-11 DIAGNOSIS — M79609 Pain in unspecified limb: Secondary | ICD-10-CM | POA: Diagnosis not present

## 2018-07-11 DIAGNOSIS — B351 Tinea unguium: Secondary | ICD-10-CM | POA: Diagnosis not present

## 2018-07-11 DIAGNOSIS — I739 Peripheral vascular disease, unspecified: Secondary | ICD-10-CM

## 2018-07-11 NOTE — Progress Notes (Signed)
Subjective: Jean Ruiz is a 83 y.o. female patient seen today in office with complaint of painful thickened and elongated toenails; unable to trim. Patient denies any changes with medical history since last visit.  Reports that she is on a full dose of Asprin for cardioprotective reasons and use to take blood thinners as previously noted. Patient has no other pedal complaints at this time.   Patient Active Problem List   Diagnosis Date Noted  . Diastolic heart failure ([place]) 04/23/2018  . Essential hypertension 04/23/2018  . Hyperlipidemia 04/23/2018  . Hypothyroidism 04/23/2018  . Atrial fibrillation ([place]) 04/23/2018  . TIA (transient ischemic attack) 04/23/2018    Current Outpatient Medications on File Prior to Visit  Medication Sig Dispense Refill  . aspirin 325 MG EC tablet Take 325 mg by mouth daily.    . ciprofloxacin (CIPRO) 500 MG tablet Take 500 mg by mouth 2 (two) times daily.    . cyanocobalamin (,VITAMIN B-12,) 1000 MCG/ML injection INJECT ONE MILLILITER WEEKLY FOR FOUR WEEKS THEN, ONE MILLILITER EVERY MONTH  4  . furosemide (LASIX) 40 MG tablet Take 40 mg by mouth 2 (two) times daily.     . hydrALAZINE (APRESOLINE) 10 MG tablet Take 10 mg by mouth daily.     . Levothyroxine Sodium (SYNTHROID PO) Take 125 mcg/day by mouth daily.     Marland Kitchen lisinopril (PRINIVIL,ZESTRIL) 40 MG tablet 40 mg daily.     Marland Kitchen LORazepam (ATIVAN) 0.5 MG tablet Take 0.5 mg by mouth daily as needed.     . metoprolol succinate (TOPROL-XL) 25 MG 24 hr tablet Take 100 mg by mouth 2 (two) times daily.     Marland Kitchen ofloxacin (FLOXIN) 0.3 % OTIC solution 5 drops daily.    . potassium chloride SA (K-DUR,KLOR-CON) 20 MEQ tablet Take 20 mEq by mouth daily.    . [DISCONTINUED] amLODipine (NORVASC) 5 MG tablet Take 5 mg by mouth.     No current facility-administered medications on file prior to visit.     Allergies  Allergen Reactions  . Ceclor [Cefaclor] Rash  . Sulfur Rash    Objective: Physical Exam  General:  Well developed, nourished, no acute distress, awake, alert and oriented x 3  Vascular: Dorsalis pedis artery 1/4 bilateral, Posterior tibial artery 0/4 bilateral, skin temperature warm to cool proximal to distal bilateral lower extremities, 1+ pitting edema at ankles, ++ varicosities with hyperpingmentation, no pedal hair present bilateral.  Neurological: Gross sensation present via light touch bilateral.   Dermatological: Skin is warm, dry, and supple bilateral, Nails 1-10 are tender, long, thick, and discolored with mild subungal debris with most involved nail left 1st toenail significant thickness however well controlled from previous debridement, no webspace macerations present bilateral, no open lesions present bilateral, no callus/corns/hyperkeratotic tissue present bilateral. No signs of infection bilateral.  Musculoskeletal: No symptomatic boney deformities noted bilateral. Muscular strength within normal limits without painon range of motion. No pain with calf compression bilateral.  Assessment and Plan:  Problem List Items Addressed This Visit    None    Visit Diagnoses    Pain due to onychomycosis of nail    -  Primary   PVD (peripheral vascular disease) ([place])          -Examined patient.  -Discussed treatment options for painful mycotic nails. -ABN signed -Mechanically debrided x10 and reduced mycotic nails with sterile nail nipper and dremel nail file without incident.  -Patient to return in 3 months for follow up evaluation/nail trim or sooner  if symptoms worsen.  Landis Martins, DPM

## 2018-07-17 DIAGNOSIS — M25611 Stiffness of right shoulder, not elsewhere classified: Secondary | ICD-10-CM | POA: Diagnosis not present

## 2018-07-17 DIAGNOSIS — M25511 Pain in right shoulder: Secondary | ICD-10-CM | POA: Diagnosis not present

## 2018-07-18 DIAGNOSIS — Z87448 Personal history of other diseases of urinary system: Secondary | ICD-10-CM | POA: Diagnosis not present

## 2018-07-18 DIAGNOSIS — R339 Retention of urine, unspecified: Secondary | ICD-10-CM | POA: Diagnosis not present

## 2018-07-18 DIAGNOSIS — N390 Urinary tract infection, site not specified: Secondary | ICD-10-CM | POA: Diagnosis not present

## 2018-07-19 DIAGNOSIS — M25511 Pain in right shoulder: Secondary | ICD-10-CM | POA: Diagnosis not present

## 2018-07-19 DIAGNOSIS — M25611 Stiffness of right shoulder, not elsewhere classified: Secondary | ICD-10-CM | POA: Diagnosis not present

## 2018-07-19 DIAGNOSIS — D519 Vitamin B12 deficiency anemia, unspecified: Secondary | ICD-10-CM | POA: Diagnosis not present

## 2018-07-25 DIAGNOSIS — M25511 Pain in right shoulder: Secondary | ICD-10-CM | POA: Diagnosis not present

## 2018-07-25 DIAGNOSIS — M25611 Stiffness of right shoulder, not elsewhere classified: Secondary | ICD-10-CM | POA: Diagnosis not present

## 2018-07-27 DIAGNOSIS — M25511 Pain in right shoulder: Secondary | ICD-10-CM | POA: Diagnosis not present

## 2018-07-27 DIAGNOSIS — M25611 Stiffness of right shoulder, not elsewhere classified: Secondary | ICD-10-CM | POA: Diagnosis not present

## 2018-07-31 DIAGNOSIS — M25511 Pain in right shoulder: Secondary | ICD-10-CM | POA: Diagnosis not present

## 2018-07-31 DIAGNOSIS — M25611 Stiffness of right shoulder, not elsewhere classified: Secondary | ICD-10-CM | POA: Diagnosis not present

## 2018-08-01 DIAGNOSIS — M25511 Pain in right shoulder: Secondary | ICD-10-CM | POA: Diagnosis not present

## 2018-08-01 DIAGNOSIS — Z1231 Encounter for screening mammogram for malignant neoplasm of breast: Secondary | ICD-10-CM | POA: Diagnosis not present

## 2018-08-01 DIAGNOSIS — M7541 Impingement syndrome of right shoulder: Secondary | ICD-10-CM | POA: Diagnosis not present

## 2018-08-20 DIAGNOSIS — D519 Vitamin B12 deficiency anemia, unspecified: Secondary | ICD-10-CM | POA: Diagnosis not present

## 2018-09-05 DIAGNOSIS — R197 Diarrhea, unspecified: Secondary | ICD-10-CM | POA: Diagnosis not present

## 2018-09-05 DIAGNOSIS — K580 Irritable bowel syndrome with diarrhea: Secondary | ICD-10-CM | POA: Diagnosis not present

## 2018-09-05 DIAGNOSIS — Z6822 Body mass index (BMI) 22.0-22.9, adult: Secondary | ICD-10-CM | POA: Diagnosis not present

## 2018-09-07 DIAGNOSIS — R197 Diarrhea, unspecified: Secondary | ICD-10-CM | POA: Diagnosis not present

## 2018-09-20 DIAGNOSIS — M47816 Spondylosis without myelopathy or radiculopathy, lumbar region: Secondary | ICD-10-CM | POA: Diagnosis not present

## 2018-09-21 DIAGNOSIS — D519 Vitamin B12 deficiency anemia, unspecified: Secondary | ICD-10-CM | POA: Diagnosis not present

## 2018-09-21 DIAGNOSIS — Z23 Encounter for immunization: Secondary | ICD-10-CM | POA: Diagnosis not present

## 2018-10-01 DIAGNOSIS — R1030 Lower abdominal pain, unspecified: Secondary | ICD-10-CM | POA: Diagnosis not present

## 2018-10-01 DIAGNOSIS — Z6822 Body mass index (BMI) 22.0-22.9, adult: Secondary | ICD-10-CM | POA: Diagnosis not present

## 2018-10-01 DIAGNOSIS — R197 Diarrhea, unspecified: Secondary | ICD-10-CM | POA: Diagnosis not present

## 2018-10-04 DIAGNOSIS — M4856XA Collapsed vertebra, not elsewhere classified, lumbar region, initial encounter for fracture: Secondary | ICD-10-CM | POA: Diagnosis not present

## 2018-10-04 DIAGNOSIS — I517 Cardiomegaly: Secondary | ICD-10-CM | POA: Diagnosis not present

## 2018-10-04 DIAGNOSIS — K8020 Calculus of gallbladder without cholecystitis without obstruction: Secondary | ICD-10-CM | POA: Diagnosis not present

## 2018-10-04 DIAGNOSIS — I700 Atherosclerosis of aorta: Secondary | ICD-10-CM | POA: Diagnosis not present

## 2018-10-04 DIAGNOSIS — D1771 Benign lipomatous neoplasm of kidney: Secondary | ICD-10-CM | POA: Diagnosis not present

## 2018-10-04 DIAGNOSIS — R1030 Lower abdominal pain, unspecified: Secondary | ICD-10-CM | POA: Diagnosis not present

## 2018-10-10 DIAGNOSIS — R339 Retention of urine, unspecified: Secondary | ICD-10-CM | POA: Diagnosis not present

## 2018-10-10 DIAGNOSIS — Z87440 Personal history of urinary (tract) infections: Secondary | ICD-10-CM | POA: Diagnosis not present

## 2018-10-10 DIAGNOSIS — Z87448 Personal history of other diseases of urinary system: Secondary | ICD-10-CM | POA: Diagnosis not present

## 2018-10-10 DIAGNOSIS — N390 Urinary tract infection, site not specified: Secondary | ICD-10-CM | POA: Diagnosis not present

## 2018-10-11 ENCOUNTER — Ambulatory Visit (INDEPENDENT_AMBULATORY_CARE_PROVIDER_SITE_OTHER): Payer: Medicare Other | Admitting: Sports Medicine

## 2018-10-11 ENCOUNTER — Encounter: Payer: Self-pay | Admitting: Sports Medicine

## 2018-10-11 VITALS — BP 149/85 | HR 74 | Resp 16

## 2018-10-11 DIAGNOSIS — I739 Peripheral vascular disease, unspecified: Secondary | ICD-10-CM

## 2018-10-11 DIAGNOSIS — B351 Tinea unguium: Secondary | ICD-10-CM | POA: Diagnosis not present

## 2018-10-11 DIAGNOSIS — M79609 Pain in unspecified limb: Secondary | ICD-10-CM | POA: Diagnosis not present

## 2018-10-11 NOTE — Progress Notes (Signed)
Subjective: Jean Ruiz is a 83 y.o. female patient seen today in office with complaint of painful thickened and elongated toenails; unable to trim. Patient denies any changes with medical history since last visit.  Reports that she is still taking her full dose of aspirin.  Denies any changes with medications since last visit. Patient has no other pedal complaints at this time.   Patient Active Problem List   Diagnosis Date Noted  . Diastolic heart failure ([place]) 04/23/2018  . Essential hypertension 04/23/2018  . Hyperlipidemia 04/23/2018  . Hypothyroidism 04/23/2018  . Atrial fibrillation ([place]) 04/23/2018  . TIA (transient ischemic attack) 04/23/2018    Current Outpatient Medications on File Prior to Visit  Medication Sig Dispense Refill  . aspirin 325 MG EC tablet Take 325 mg by mouth daily.    . ciprofloxacin (CIPRO) 500 MG tablet Take 500 mg by mouth 2 (two) times daily.    . cyanocobalamin (,VITAMIN B-12,) 1000 MCG/ML injection INJECT ONE MILLILITER WEEKLY FOR FOUR WEEKS THEN, ONE MILLILITER EVERY MONTH  4  . furosemide (LASIX) 40 MG tablet Take 40 mg by mouth 2 (two) times daily.     . hydrALAZINE (APRESOLINE) 10 MG tablet Take 10 mg by mouth daily.     . Levothyroxine Sodium (SYNTHROID PO) Take 125 mcg/day by mouth daily.     Marland Kitchen lisinopril (PRINIVIL,ZESTRIL) 40 MG tablet 40 mg daily.     Marland Kitchen LORazepam (ATIVAN) 0.5 MG tablet Take 0.5 mg by mouth daily as needed.     . metoprolol succinate (TOPROL-XL) 25 MG 24 hr tablet Take 100 mg by mouth 2 (two) times daily.     Marland Kitchen ofloxacin (FLOXIN) 0.3 % OTIC solution 5 drops daily.    . potassium chloride SA (K-DUR,KLOR-CON) 20 MEQ tablet Take 20 mEq by mouth daily.    . [DISCONTINUED] amLODipine (NORVASC) 5 MG tablet Take 5 mg by mouth.     No current facility-administered medications on file prior to visit.     Allergies  Allergen Reactions  . Ceclor [Cefaclor] Rash  . Sulfur Rash    Objective: Physical Exam  General: Well  developed, nourished, no acute distress, awake, alert and oriented x 3  Vascular: Dorsalis pedis artery 1/4 bilateral, Posterior tibial artery 0/4 bilateral, skin temperature warm to cool proximal to distal bilateral lower extremities, 1+ pitting edema at ankles, ++ varicosities with hyperpingmentation, no pedal hair present bilateral.  Neurological: Gross sensation present via light touch bilateral.   Dermatological: Skin is warm, dry, and supple bilateral, Nails 1-10 are tender, long, thick, and discolored with mild subungal debris with most involved nail left 1st toenail significant thickness however well controlled from previous debridement, no webspace macerations present bilateral, no open lesions present bilateral, no callus/corns/hyperkeratotic tissue present bilateral. No signs of infection bilateral.  Musculoskeletal: No symptomatic boney deformities noted bilateral. Muscular strength within normal limits without painon range of motion. No pain with calf compression bilateral.  Assessment and Plan:  Problem List Items Addressed This Visit    None    Visit Diagnoses    Pain due to onychomycosis of nail    -  Primary   PVD (peripheral vascular disease) ([place])          -Examined patient.  -Discussed treatment options for painful mycotic nails. -Mechanically debrided x10 and reduced mycotic nails with sterile nail nipper and dremel nail file without incident.  -Patient to return in 3 months for follow up evaluation/nail trim or sooner if symptoms worsen.  Landis Martins, DPM

## 2018-10-18 DIAGNOSIS — Z1339 Encounter for screening examination for other mental health and behavioral disorders: Secondary | ICD-10-CM | POA: Diagnosis not present

## 2018-10-18 DIAGNOSIS — Z139 Encounter for screening, unspecified: Secondary | ICD-10-CM | POA: Diagnosis not present

## 2018-10-18 DIAGNOSIS — R197 Diarrhea, unspecified: Secondary | ICD-10-CM | POA: Diagnosis not present

## 2018-10-18 DIAGNOSIS — Z23 Encounter for immunization: Secondary | ICD-10-CM | POA: Diagnosis not present

## 2018-10-18 DIAGNOSIS — E785 Hyperlipidemia, unspecified: Secondary | ICD-10-CM | POA: Diagnosis not present

## 2018-10-18 DIAGNOSIS — K8020 Calculus of gallbladder without cholecystitis without obstruction: Secondary | ICD-10-CM | POA: Diagnosis not present

## 2018-10-18 DIAGNOSIS — R933 Abnormal findings on diagnostic imaging of other parts of digestive tract: Secondary | ICD-10-CM | POA: Diagnosis not present

## 2018-10-18 DIAGNOSIS — Z0000 Encounter for general adult medical examination without abnormal findings: Secondary | ICD-10-CM | POA: Diagnosis not present

## 2018-10-18 DIAGNOSIS — Z136 Encounter for screening for cardiovascular disorders: Secondary | ICD-10-CM | POA: Diagnosis not present

## 2018-10-23 DIAGNOSIS — R197 Diarrhea, unspecified: Secondary | ICD-10-CM | POA: Diagnosis not present

## 2018-10-23 DIAGNOSIS — D519 Vitamin B12 deficiency anemia, unspecified: Secondary | ICD-10-CM | POA: Diagnosis not present

## 2018-11-05 DIAGNOSIS — Z85850 Personal history of malignant neoplasm of thyroid: Secondary | ICD-10-CM | POA: Diagnosis not present

## 2018-11-05 DIAGNOSIS — E039 Hypothyroidism, unspecified: Secondary | ICD-10-CM | POA: Diagnosis not present

## 2018-11-06 DIAGNOSIS — J019 Acute sinusitis, unspecified: Secondary | ICD-10-CM | POA: Diagnosis not present

## 2018-11-19 DIAGNOSIS — E039 Hypothyroidism, unspecified: Secondary | ICD-10-CM | POA: Diagnosis not present

## 2018-11-19 DIAGNOSIS — Z85850 Personal history of malignant neoplasm of thyroid: Secondary | ICD-10-CM | POA: Diagnosis not present

## 2018-11-19 DIAGNOSIS — E890 Postprocedural hypothyroidism: Secondary | ICD-10-CM | POA: Diagnosis not present

## 2018-11-20 DIAGNOSIS — K589 Irritable bowel syndrome without diarrhea: Secondary | ICD-10-CM | POA: Diagnosis not present

## 2018-11-23 DIAGNOSIS — M810 Age-related osteoporosis without current pathological fracture: Secondary | ICD-10-CM | POA: Diagnosis not present

## 2018-11-23 DIAGNOSIS — D519 Vitamin B12 deficiency anemia, unspecified: Secondary | ICD-10-CM | POA: Diagnosis not present

## 2018-11-29 DIAGNOSIS — M810 Age-related osteoporosis without current pathological fracture: Secondary | ICD-10-CM | POA: Diagnosis not present

## 2018-12-24 DIAGNOSIS — D519 Vitamin B12 deficiency anemia, unspecified: Secondary | ICD-10-CM | POA: Diagnosis not present

## 2019-01-11 ENCOUNTER — Ambulatory Visit: Payer: Medicare Other | Admitting: Sports Medicine

## 2019-01-24 DIAGNOSIS — D519 Vitamin B12 deficiency anemia, unspecified: Secondary | ICD-10-CM | POA: Diagnosis not present

## 2019-01-25 DIAGNOSIS — Z87440 Personal history of urinary (tract) infections: Secondary | ICD-10-CM | POA: Diagnosis not present

## 2019-01-25 DIAGNOSIS — Z87448 Personal history of other diseases of urinary system: Secondary | ICD-10-CM | POA: Diagnosis not present

## 2019-01-25 DIAGNOSIS — N390 Urinary tract infection, site not specified: Secondary | ICD-10-CM | POA: Diagnosis not present

## 2019-01-25 DIAGNOSIS — N3582 Other urethral stricture, female: Secondary | ICD-10-CM | POA: Diagnosis not present

## 2019-01-25 DIAGNOSIS — R8290 Unspecified abnormal findings in urine: Secondary | ICD-10-CM | POA: Diagnosis not present

## 2019-01-25 DIAGNOSIS — R338 Other retention of urine: Secondary | ICD-10-CM | POA: Diagnosis not present

## 2019-02-06 DIAGNOSIS — N390 Urinary tract infection, site not specified: Secondary | ICD-10-CM | POA: Diagnosis not present

## 2019-02-06 DIAGNOSIS — R110 Nausea: Secondary | ICD-10-CM | POA: Diagnosis not present

## 2019-02-07 DIAGNOSIS — N390 Urinary tract infection, site not specified: Secondary | ICD-10-CM | POA: Diagnosis not present

## 2019-02-08 DIAGNOSIS — R1111 Vomiting without nausea: Secondary | ICD-10-CM | POA: Diagnosis not present

## 2019-02-08 DIAGNOSIS — K7689 Other specified diseases of liver: Secondary | ICD-10-CM | POA: Diagnosis not present

## 2019-02-08 DIAGNOSIS — R1013 Epigastric pain: Secondary | ICD-10-CM | POA: Diagnosis not present

## 2019-02-08 DIAGNOSIS — T368X5A Adverse effect of other systemic antibiotics, initial encounter: Secondary | ICD-10-CM | POA: Diagnosis not present

## 2019-02-08 DIAGNOSIS — K8020 Calculus of gallbladder without cholecystitis without obstruction: Secondary | ICD-10-CM | POA: Diagnosis not present

## 2019-02-08 DIAGNOSIS — T887XXA Unspecified adverse effect of drug or medicament, initial encounter: Secondary | ICD-10-CM | POA: Diagnosis not present

## 2019-02-08 DIAGNOSIS — R531 Weakness: Secondary | ICD-10-CM | POA: Diagnosis not present

## 2019-02-08 DIAGNOSIS — R0602 Shortness of breath: Secondary | ICD-10-CM | POA: Diagnosis not present

## 2019-02-08 DIAGNOSIS — R251 Tremor, unspecified: Secondary | ICD-10-CM | POA: Diagnosis not present

## 2019-02-08 DIAGNOSIS — R112 Nausea with vomiting, unspecified: Secondary | ICD-10-CM | POA: Diagnosis not present

## 2019-02-08 DIAGNOSIS — J984 Other disorders of lung: Secondary | ICD-10-CM | POA: Diagnosis not present

## 2019-02-08 DIAGNOSIS — R110 Nausea: Secondary | ICD-10-CM | POA: Diagnosis not present

## 2019-02-08 DIAGNOSIS — T50905A Adverse effect of unspecified drugs, medicaments and biological substances, initial encounter: Secondary | ICD-10-CM | POA: Diagnosis not present

## 2019-02-14 ENCOUNTER — Ambulatory Visit: Payer: Medicare Other | Admitting: Sports Medicine

## 2019-02-15 ENCOUNTER — Ambulatory Visit: Payer: Medicare Other | Admitting: Sports Medicine

## 2019-02-18 DIAGNOSIS — E871 Hypo-osmolality and hyponatremia: Secondary | ICD-10-CM | POA: Diagnosis not present

## 2019-02-18 DIAGNOSIS — I10 Essential (primary) hypertension: Secondary | ICD-10-CM | POA: Diagnosis not present

## 2019-02-18 DIAGNOSIS — R079 Chest pain, unspecified: Secondary | ICD-10-CM | POA: Diagnosis not present

## 2019-02-18 DIAGNOSIS — I4891 Unspecified atrial fibrillation: Secondary | ICD-10-CM | POA: Diagnosis not present

## 2019-02-18 DIAGNOSIS — I639 Cerebral infarction, unspecified: Secondary | ICD-10-CM | POA: Diagnosis not present

## 2019-02-18 DIAGNOSIS — R4781 Slurred speech: Secondary | ICD-10-CM | POA: Diagnosis not present

## 2019-02-18 DIAGNOSIS — I6523 Occlusion and stenosis of bilateral carotid arteries: Secondary | ICD-10-CM | POA: Diagnosis not present

## 2019-02-18 DIAGNOSIS — R531 Weakness: Secondary | ICD-10-CM | POA: Diagnosis not present

## 2019-02-18 DIAGNOSIS — R29810 Facial weakness: Secondary | ICD-10-CM | POA: Diagnosis not present

## 2019-02-18 DIAGNOSIS — R479 Unspecified speech disturbances: Secondary | ICD-10-CM | POA: Diagnosis not present

## 2019-02-18 DIAGNOSIS — R4701 Aphasia: Secondary | ICD-10-CM | POA: Diagnosis not present

## 2019-02-18 DIAGNOSIS — R410 Disorientation, unspecified: Secondary | ICD-10-CM | POA: Diagnosis not present

## 2019-02-18 DIAGNOSIS — R8290 Unspecified abnormal findings in urine: Secondary | ICD-10-CM

## 2019-02-18 DIAGNOSIS — I361 Nonrheumatic tricuspid (valve) insufficiency: Secondary | ICD-10-CM | POA: Diagnosis not present

## 2019-02-18 DIAGNOSIS — J80 Acute respiratory distress syndrome: Secondary | ICD-10-CM | POA: Diagnosis not present

## 2019-02-19 DIAGNOSIS — E039 Hypothyroidism, unspecified: Secondary | ICD-10-CM | POA: Diagnosis present

## 2019-02-19 DIAGNOSIS — I4891 Unspecified atrial fibrillation: Secondary | ICD-10-CM | POA: Diagnosis not present

## 2019-02-19 DIAGNOSIS — Z741 Need for assistance with personal care: Secondary | ICD-10-CM | POA: Diagnosis not present

## 2019-02-19 DIAGNOSIS — Z79899 Other long term (current) drug therapy: Secondary | ICD-10-CM | POA: Diagnosis not present

## 2019-02-19 DIAGNOSIS — Z7401 Bed confinement status: Secondary | ICD-10-CM | POA: Diagnosis not present

## 2019-02-19 DIAGNOSIS — R41841 Cognitive communication deficit: Secondary | ICD-10-CM | POA: Diagnosis not present

## 2019-02-19 DIAGNOSIS — K219 Gastro-esophageal reflux disease without esophagitis: Secondary | ICD-10-CM | POA: Diagnosis present

## 2019-02-19 DIAGNOSIS — R531 Weakness: Secondary | ICD-10-CM | POA: Diagnosis not present

## 2019-02-19 DIAGNOSIS — I10 Essential (primary) hypertension: Secondary | ICD-10-CM | POA: Diagnosis not present

## 2019-02-19 DIAGNOSIS — G9341 Metabolic encephalopathy: Secondary | ICD-10-CM | POA: Diagnosis not present

## 2019-02-19 DIAGNOSIS — Z881 Allergy status to other antibiotic agents status: Secondary | ICD-10-CM | POA: Diagnosis not present

## 2019-02-19 DIAGNOSIS — M2550 Pain in unspecified joint: Secondary | ICD-10-CM | POA: Diagnosis not present

## 2019-02-19 DIAGNOSIS — I110 Hypertensive heart disease with heart failure: Secondary | ICD-10-CM | POA: Diagnosis present

## 2019-02-19 DIAGNOSIS — G459 Transient cerebral ischemic attack, unspecified: Secondary | ICD-10-CM | POA: Diagnosis not present

## 2019-02-19 DIAGNOSIS — I351 Nonrheumatic aortic (valve) insufficiency: Secondary | ICD-10-CM | POA: Diagnosis not present

## 2019-02-19 DIAGNOSIS — E7800 Pure hypercholesterolemia, unspecified: Secondary | ICD-10-CM | POA: Diagnosis present

## 2019-02-19 DIAGNOSIS — R4781 Slurred speech: Secondary | ICD-10-CM | POA: Diagnosis not present

## 2019-02-19 DIAGNOSIS — R2681 Unsteadiness on feet: Secondary | ICD-10-CM | POA: Diagnosis not present

## 2019-02-19 DIAGNOSIS — R079 Chest pain, unspecified: Secondary | ICD-10-CM | POA: Diagnosis not present

## 2019-02-19 DIAGNOSIS — I361 Nonrheumatic tricuspid (valve) insufficiency: Secondary | ICD-10-CM | POA: Diagnosis not present

## 2019-02-19 DIAGNOSIS — R479 Unspecified speech disturbances: Secondary | ICD-10-CM | POA: Diagnosis not present

## 2019-02-19 DIAGNOSIS — F419 Anxiety disorder, unspecified: Secondary | ICD-10-CM | POA: Diagnosis present

## 2019-02-19 DIAGNOSIS — R8290 Unspecified abnormal findings in urine: Secondary | ICD-10-CM | POA: Diagnosis not present

## 2019-02-19 DIAGNOSIS — R2689 Other abnormalities of gait and mobility: Secondary | ICD-10-CM | POA: Diagnosis not present

## 2019-02-19 DIAGNOSIS — I639 Cerebral infarction, unspecified: Secondary | ICD-10-CM | POA: Diagnosis not present

## 2019-02-19 DIAGNOSIS — R4701 Aphasia: Secondary | ICD-10-CM | POA: Diagnosis not present

## 2019-02-19 DIAGNOSIS — I5032 Chronic diastolic (congestive) heart failure: Secondary | ICD-10-CM | POA: Diagnosis not present

## 2019-02-19 DIAGNOSIS — I482 Chronic atrial fibrillation, unspecified: Secondary | ICD-10-CM | POA: Diagnosis not present

## 2019-02-19 DIAGNOSIS — M6281 Muscle weakness (generalized): Secondary | ICD-10-CM | POA: Diagnosis not present

## 2019-02-19 DIAGNOSIS — Z882 Allergy status to sulfonamides status: Secondary | ICD-10-CM | POA: Diagnosis not present

## 2019-02-19 DIAGNOSIS — E871 Hypo-osmolality and hyponatremia: Secondary | ICD-10-CM | POA: Diagnosis not present

## 2019-02-20 DIAGNOSIS — E871 Hypo-osmolality and hyponatremia: Secondary | ICD-10-CM | POA: Diagnosis not present

## 2019-02-20 DIAGNOSIS — I4891 Unspecified atrial fibrillation: Secondary | ICD-10-CM | POA: Diagnosis not present

## 2019-02-20 DIAGNOSIS — E039 Hypothyroidism, unspecified: Secondary | ICD-10-CM | POA: Diagnosis not present

## 2019-02-20 DIAGNOSIS — R2689 Other abnormalities of gait and mobility: Secondary | ICD-10-CM | POA: Diagnosis not present

## 2019-02-20 DIAGNOSIS — R2681 Unsteadiness on feet: Secondary | ICD-10-CM | POA: Diagnosis not present

## 2019-02-20 DIAGNOSIS — R41841 Cognitive communication deficit: Secondary | ICD-10-CM | POA: Diagnosis not present

## 2019-02-20 DIAGNOSIS — R8290 Unspecified abnormal findings in urine: Secondary | ICD-10-CM | POA: Diagnosis not present

## 2019-02-20 DIAGNOSIS — M25562 Pain in left knee: Secondary | ICD-10-CM | POA: Diagnosis not present

## 2019-02-20 DIAGNOSIS — I5032 Chronic diastolic (congestive) heart failure: Secondary | ICD-10-CM | POA: Diagnosis not present

## 2019-02-20 DIAGNOSIS — Z8719 Personal history of other diseases of the digestive system: Secondary | ICD-10-CM | POA: Diagnosis not present

## 2019-02-20 DIAGNOSIS — I69320 Aphasia following cerebral infarction: Secondary | ICD-10-CM | POA: Diagnosis not present

## 2019-02-20 DIAGNOSIS — F419 Anxiety disorder, unspecified: Secondary | ICD-10-CM | POA: Diagnosis not present

## 2019-02-20 DIAGNOSIS — R279 Unspecified lack of coordination: Secondary | ICD-10-CM | POA: Diagnosis not present

## 2019-02-20 DIAGNOSIS — I10 Essential (primary) hypertension: Secondary | ICD-10-CM | POA: Diagnosis not present

## 2019-02-20 DIAGNOSIS — K219 Gastro-esophageal reflux disease without esophagitis: Secondary | ICD-10-CM | POA: Diagnosis not present

## 2019-02-20 DIAGNOSIS — Z7401 Bed confinement status: Secondary | ICD-10-CM | POA: Diagnosis not present

## 2019-02-20 DIAGNOSIS — Z7902 Long term (current) use of antithrombotics/antiplatelets: Secondary | ICD-10-CM | POA: Diagnosis not present

## 2019-02-20 DIAGNOSIS — M2550 Pain in unspecified joint: Secondary | ICD-10-CM | POA: Diagnosis not present

## 2019-02-20 DIAGNOSIS — G459 Transient cerebral ischemic attack, unspecified: Secondary | ICD-10-CM | POA: Diagnosis not present

## 2019-02-20 DIAGNOSIS — Z741 Need for assistance with personal care: Secondary | ICD-10-CM | POA: Diagnosis not present

## 2019-02-20 DIAGNOSIS — M150 Primary generalized (osteo)arthritis: Secondary | ICD-10-CM | POA: Diagnosis not present

## 2019-02-20 DIAGNOSIS — R4701 Aphasia: Secondary | ICD-10-CM | POA: Diagnosis not present

## 2019-02-20 DIAGNOSIS — R5381 Other malaise: Secondary | ICD-10-CM | POA: Diagnosis not present

## 2019-02-20 DIAGNOSIS — M6281 Muscle weakness (generalized): Secondary | ICD-10-CM | POA: Diagnosis not present

## 2019-02-20 DIAGNOSIS — I110 Hypertensive heart disease with heart failure: Secondary | ICD-10-CM | POA: Diagnosis not present

## 2019-02-20 DIAGNOSIS — D692 Other nonthrombocytopenic purpura: Secondary | ICD-10-CM | POA: Diagnosis not present

## 2019-02-20 DIAGNOSIS — I351 Nonrheumatic aortic (valve) insufficiency: Secondary | ICD-10-CM | POA: Diagnosis not present

## 2019-02-20 DIAGNOSIS — I639 Cerebral infarction, unspecified: Secondary | ICD-10-CM | POA: Diagnosis not present

## 2019-02-20 DIAGNOSIS — I361 Nonrheumatic tricuspid (valve) insufficiency: Secondary | ICD-10-CM | POA: Diagnosis not present

## 2019-02-20 DIAGNOSIS — M25532 Pain in left wrist: Secondary | ICD-10-CM | POA: Diagnosis not present

## 2019-02-20 DIAGNOSIS — I482 Chronic atrial fibrillation, unspecified: Secondary | ICD-10-CM | POA: Diagnosis not present

## 2019-02-22 DIAGNOSIS — M25532 Pain in left wrist: Secondary | ICD-10-CM | POA: Diagnosis not present

## 2019-02-22 DIAGNOSIS — I4891 Unspecified atrial fibrillation: Secondary | ICD-10-CM | POA: Diagnosis not present

## 2019-02-22 DIAGNOSIS — R5381 Other malaise: Secondary | ICD-10-CM | POA: Diagnosis not present

## 2019-02-22 DIAGNOSIS — M25562 Pain in left knee: Secondary | ICD-10-CM | POA: Diagnosis not present

## 2019-02-22 DIAGNOSIS — G459 Transient cerebral ischemic attack, unspecified: Secondary | ICD-10-CM | POA: Diagnosis not present

## 2019-02-26 DIAGNOSIS — I110 Hypertensive heart disease with heart failure: Secondary | ICD-10-CM | POA: Diagnosis not present

## 2019-02-26 DIAGNOSIS — K219 Gastro-esophageal reflux disease without esophagitis: Secondary | ICD-10-CM | POA: Diagnosis not present

## 2019-02-26 DIAGNOSIS — R5381 Other malaise: Secondary | ICD-10-CM | POA: Diagnosis not present

## 2019-02-26 DIAGNOSIS — E039 Hypothyroidism, unspecified: Secondary | ICD-10-CM | POA: Diagnosis not present

## 2019-02-27 ENCOUNTER — Other Ambulatory Visit: Payer: Self-pay | Admitting: *Deleted

## 2019-02-27 NOTE — Patient Outreach (Signed)
[facility] [facility]) Care Management  02/27/2019  Jean Ruiz 1935-08-09 [id number]   Collaboration with [facility] UM. Jean Ruiz is currently at [employer].  Writer will continue to follow for potential [facility] Care Management needs and will make appropriate [facility] Care Management referral closer to discharge from SNF if needed.    Marthenia Rolling, MSN-Ed, RN,BSN [place] Acute Care Coordinator [phone]

## 2019-03-08 DIAGNOSIS — I110 Hypertensive heart disease with heart failure: Secondary | ICD-10-CM | POA: Diagnosis not present

## 2019-03-08 DIAGNOSIS — G459 Transient cerebral ischemic attack, unspecified: Secondary | ICD-10-CM | POA: Diagnosis not present

## 2019-03-08 DIAGNOSIS — I4891 Unspecified atrial fibrillation: Secondary | ICD-10-CM | POA: Diagnosis not present

## 2019-03-08 DIAGNOSIS — R5381 Other malaise: Secondary | ICD-10-CM | POA: Diagnosis not present

## 2019-03-12 ENCOUNTER — Other Ambulatory Visit: Payer: Self-pay | Admitting: *Deleted

## 2019-03-12 DIAGNOSIS — R279 Unspecified lack of coordination: Secondary | ICD-10-CM | POA: Diagnosis not present

## 2019-03-12 DIAGNOSIS — G459 Transient cerebral ischemic attack, unspecified: Secondary | ICD-10-CM | POA: Diagnosis not present

## 2019-03-12 DIAGNOSIS — K219 Gastro-esophageal reflux disease without esophagitis: Secondary | ICD-10-CM | POA: Diagnosis not present

## 2019-03-12 DIAGNOSIS — I482 Chronic atrial fibrillation, unspecified: Secondary | ICD-10-CM | POA: Diagnosis not present

## 2019-03-12 DIAGNOSIS — M150 Primary generalized (osteo)arthritis: Secondary | ICD-10-CM | POA: Diagnosis not present

## 2019-03-12 DIAGNOSIS — Z8719 Personal history of other diseases of the digestive system: Secondary | ICD-10-CM | POA: Diagnosis not present

## 2019-03-12 DIAGNOSIS — M25532 Pain in left wrist: Secondary | ICD-10-CM | POA: Diagnosis not present

## 2019-03-12 DIAGNOSIS — E039 Hypothyroidism, unspecified: Secondary | ICD-10-CM | POA: Diagnosis not present

## 2019-03-12 DIAGNOSIS — D692 Other nonthrombocytopenic purpura: Secondary | ICD-10-CM | POA: Diagnosis not present

## 2019-03-12 DIAGNOSIS — Z7902 Long term (current) use of antithrombotics/antiplatelets: Secondary | ICD-10-CM | POA: Diagnosis not present

## 2019-03-12 DIAGNOSIS — I5032 Chronic diastolic (congestive) heart failure: Secondary | ICD-10-CM | POA: Diagnosis not present

## 2019-03-12 DIAGNOSIS — I361 Nonrheumatic tricuspid (valve) insufficiency: Secondary | ICD-10-CM | POA: Diagnosis not present

## 2019-03-12 DIAGNOSIS — I69320 Aphasia following cerebral infarction: Secondary | ICD-10-CM | POA: Diagnosis not present

## 2019-03-12 DIAGNOSIS — I110 Hypertensive heart disease with heart failure: Secondary | ICD-10-CM | POA: Diagnosis not present

## 2019-03-12 NOTE — Patient Outreach (Signed)
[place] [facility]) Care Management  03/12/2019  [name] [birth year] [id number]   Collaboration with [facility] UM. Ms. Grant slated to discharge soon from [facility]. It appears disposition plan is to return home with caregiver assistance at night. [name] lives alone.   Member has been active with [place] Management in the past. [facility] active consent remains on file.   Attempted to call member to discuss [place] Management follow up. However, unable to reach and voicemail box was not set up.   Will follow for discharge date and make referral to [facility] for complex case management needs. [name] has a history of HF, afib, TIA, HLD, HTN.   Will continue to collaborate with [facility] UM on member.  Marthenia Rolling, MSN-Ed, RN,BSN [place] Acute Care Coordinator [phone]

## 2019-03-13 ENCOUNTER — Other Ambulatory Visit: Payer: Self-pay | Admitting: *Deleted

## 2019-03-13 DIAGNOSIS — I110 Hypertensive heart disease with heart failure: Secondary | ICD-10-CM | POA: Diagnosis not present

## 2019-03-13 DIAGNOSIS — I5031 Acute diastolic (congestive) heart failure: Secondary | ICD-10-CM

## 2019-03-13 DIAGNOSIS — I361 Nonrheumatic tricuspid (valve) insufficiency: Secondary | ICD-10-CM | POA: Diagnosis not present

## 2019-03-13 DIAGNOSIS — R4701 Aphasia: Secondary | ICD-10-CM | POA: Diagnosis not present

## 2019-03-13 DIAGNOSIS — I482 Chronic atrial fibrillation, unspecified: Secondary | ICD-10-CM | POA: Diagnosis not present

## 2019-03-13 DIAGNOSIS — I10 Essential (primary) hypertension: Secondary | ICD-10-CM | POA: Diagnosis not present

## 2019-03-13 DIAGNOSIS — E876 Hypokalemia: Secondary | ICD-10-CM | POA: Diagnosis not present

## 2019-03-13 DIAGNOSIS — I69320 Aphasia following cerebral infarction: Secondary | ICD-10-CM | POA: Diagnosis not present

## 2019-03-13 DIAGNOSIS — G459 Transient cerebral ischemic attack, unspecified: Secondary | ICD-10-CM | POA: Diagnosis not present

## 2019-03-13 DIAGNOSIS — I5032 Chronic diastolic (congestive) heart failure: Secondary | ICD-10-CM | POA: Diagnosis not present

## 2019-03-13 NOTE — Patient Outreach (Signed)
[facility]) Care Management  03/13/2019  TINZLEY DALIA 1935-10-14 [id number]    [facility] Post Acute Care Coordinator follow up.  Per Patient Pearletha For[place], member discharged home on 03/12/2019 from [facility].  Will make referral to [facility]. Active [facility] consent remains on file. She was active with [facility] CM last year.  Member has a medical history of diastolic heart failure, afib.    Marthenia Rolling, MSN-Ed, RN,BSN [place] Acute Care Coordinator [phone]

## 2019-03-14 ENCOUNTER — Other Ambulatory Visit: Payer: Self-pay | Admitting: *Deleted

## 2019-03-14 DIAGNOSIS — I110 Hypertensive heart disease with heart failure: Secondary | ICD-10-CM | POA: Diagnosis not present

## 2019-03-14 DIAGNOSIS — I361 Nonrheumatic tricuspid (valve) insufficiency: Secondary | ICD-10-CM | POA: Diagnosis not present

## 2019-03-14 DIAGNOSIS — G459 Transient cerebral ischemic attack, unspecified: Secondary | ICD-10-CM | POA: Diagnosis not present

## 2019-03-14 DIAGNOSIS — I482 Chronic atrial fibrillation, unspecified: Secondary | ICD-10-CM | POA: Diagnosis not present

## 2019-03-14 DIAGNOSIS — I5032 Chronic diastolic (congestive) heart failure: Secondary | ICD-10-CM | POA: Diagnosis not present

## 2019-03-14 DIAGNOSIS — I69320 Aphasia following cerebral infarction: Secondary | ICD-10-CM | POA: Diagnosis not present

## 2019-03-14 NOTE — Patient Outreach (Signed)
[name]) Care Management  03/14/2019  Jean Ruiz 26-Feb-1935 [id number]   Initial telephone outreach call   Referral received : 03/13/19 Referral source : [facility] UM, post acute care coordinator Referral reason : [facility] from [facility], on 4/7, Hx; Diastolic heart failure , atrial fib.   Review of [facility] for [place] that includes but not limited to    Initial outreach call to patient at home number no answer phone rang greater than 12 times no voicemail pick up. Placed call to patient mobile number no voice mail set up.   Plan Will send unsuccessful outreach letter  Plan return call in the next 4 business days if no return call on today .    [name], RN, [place] Management Coordinator  [phone]- Mobile [phone]- Toll Free Main Office

## 2019-03-15 DIAGNOSIS — G459 Transient cerebral ischemic attack, unspecified: Secondary | ICD-10-CM | POA: Diagnosis not present

## 2019-03-15 DIAGNOSIS — I69320 Aphasia following cerebral infarction: Secondary | ICD-10-CM | POA: Diagnosis not present

## 2019-03-15 DIAGNOSIS — I361 Nonrheumatic tricuspid (valve) insufficiency: Secondary | ICD-10-CM | POA: Diagnosis not present

## 2019-03-15 DIAGNOSIS — I482 Chronic atrial fibrillation, unspecified: Secondary | ICD-10-CM | POA: Diagnosis not present

## 2019-03-15 DIAGNOSIS — I110 Hypertensive heart disease with heart failure: Secondary | ICD-10-CM | POA: Diagnosis not present

## 2019-03-15 DIAGNOSIS — I5032 Chronic diastolic (congestive) heart failure: Secondary | ICD-10-CM | POA: Diagnosis not present

## 2019-03-18 ENCOUNTER — Other Ambulatory Visit: Payer: Self-pay | Admitting: *Deleted

## 2019-03-18 ENCOUNTER — Other Ambulatory Visit: Payer: Self-pay

## 2019-03-18 DIAGNOSIS — I361 Nonrheumatic tricuspid (valve) insufficiency: Secondary | ICD-10-CM | POA: Diagnosis not present

## 2019-03-18 DIAGNOSIS — I482 Chronic atrial fibrillation, unspecified: Secondary | ICD-10-CM | POA: Diagnosis not present

## 2019-03-18 DIAGNOSIS — I69320 Aphasia following cerebral infarction: Secondary | ICD-10-CM | POA: Diagnosis not present

## 2019-03-18 DIAGNOSIS — I110 Hypertensive heart disease with heart failure: Secondary | ICD-10-CM | POA: Diagnosis not present

## 2019-03-18 DIAGNOSIS — G459 Transient cerebral ischemic attack, unspecified: Secondary | ICD-10-CM | POA: Diagnosis not present

## 2019-03-18 DIAGNOSIS — I5032 Chronic diastolic (congestive) heart failure: Secondary | ICD-10-CM | POA: Diagnosis not present

## 2019-03-18 NOTE — Patient Outreach (Addendum)
[place] [facility]) Care Management  03/18/2019  ALLIA WILTSEY [birth year] [id number]   Referral received : 03/13/19 Referral source : [facility] UM, post acute care coordinator Referral reason : [facility] from [facility], on 4/7, Hx; Diastolic heart failure , atrial fib, R/o CVA, possible TIA .   Review of KPN for PHX that includes but not limited to : Hypertension, hyperlipidemia, atrial fib, diastolic heart failure .   Outreach call to patient ,began to introduce by self and explain the  reason for the call  HIPAA confirmed, patient reports that home health OT is present now and request return call later.   1245 Returned call to patient , explained reason for the call and [facility] care management, patient familiar with follow up in the last year.  Patient discussed her [facility].  Patient discussed recent death of her spouse in last month and another family member. She reports getting just worn out, weak , recent UTI  She discussed having condition of atrial fib but that is controlled .  Patient further discussed :  Conditions  Having a history of heart failure, she denies shortness of breath or swelling, she hadn't been weighting daily, but takes lasix daily . Discussed worsening symptoms of heart failure , sudden weight gain of 5 pounds in a week, 3 pounds in a day , shortness of breath and swelling.  Patient discussed having weight loss of about 10 lbs  since being in the hospital and rehab and want to work on gain weight and getting stronger Patient reports that she is eating good and appetite improved . Patient reports using a walker  at this time and she is feeling so much better since her stay for rehab. Discussed possible diagnosis of TIA at hospital admission , she declines having this , reviewed symptoms of TIA/stroke, weakness , numbness, face, legs, difficulty talking , and how atrial fib puts her at risk for this.   Social  Patient lives at home  alone during the day, she has someone hired that stay 9pm to 9 am and will help prepare her breakfast. Patient reports feeling stronger, she uses a walker now , she denies recent falls. Patient discussed her goal of getting stronger and being able to get back to driving, but understands that she is not strong enough to do that at present.  Patient has supportive family and friends that are providing meals and all she has to do is warm them up.  Patient discussed loss of her spouse and grief she denies attending any counseling but has good pastoral support, discussed grief counseling resources in the areas patient states she may be interested , but can't drive yet.  Discussed Covid 19 symptoms of shortness of breath, cough and fever which patient declines, she voiced understanding notifying MD of occurs. Reviewed with patient importance of staying in , social distancing, states she is getting lots of phone calls and no one visiting.  Medications  Patient unable to review medication list at this time, she reports using a pill organizer and HHRN helped with filling on last week and has a visit planned on tomorrow and will help with refilling. Patient denies cost concern related to medications and agreeable to review medications list at next outreach.   Advanced directive Patient has a HCPOA , she does not have a  living willing.   Appointments Patient has attended post discharge visit with PCP with telephonic visit.   Consent  Patient  is agreeable to [facility] care management services for post discharge transition of care and education and support of chronic conditions.   Plan  Will send PCP barrier/involvement letter  Will send patient welcome letter, with Advanced directive packet and EMMI Will plan return call in the next week for weekly transition of care outreach.  Goal setting and care planning  Placed call to [facility], regarding grief counseling , spoke with Odetta Pink [phone] ,  she discussed that they are doing telephone or televisual visits with patient. Provided patient with contact number of  Hospice  to discuss obtaining consent for grief counseling she plans to return call on tomorrow.    [facility] CM Care Plan Problem One     Most Recent Value  Care Plan Problem One  Recent hospital admission and rehab stay related to weakness, possible TIA, UTI   Role Documenting the Problem One  Care Management [place] for Problem One  Active  THN Long Term Goal   Patient will not experience a hospital readmission in the next 31 days   THN Long Term Goal Start Date  03/18/19  Interventions for Problem One Long Term Goal  Discussed importance of notifying MD sooner regarding new concerns symptoms to arrange follow up to avoid readmission, reviewed symptoms of TIA and stressed importance of seeking medical attention is occurs.  .   THN CM Short Term Goal #1   Patient will be able to report weight gain of 2 pounds over the next 30 days   THN CM Short Term Goal #1 Start Date  03/18/19  Interventions for Short Term Goal #1  Reviewed with patient importance of balanced meals daily and including protein, reviewed protien examples and how including in diet helps wiith  building muscle and bone strength .   THN CM Short Term Goal #2   Over the next 30 days patient will be able to report increase in strength  and balance to complete daily ADL's.   THN CM Short Term Goal #2 Start Date  03/18/19  Interventions for Short Term Goal #2  Reinforced participation on home exercises as recommended by home therapy , use walker for safety to avoid falls.       [name], RN, [place] Management Coordinator  [phone]- Mobile [phone]- Toll Free Main Office

## 2019-03-19 DIAGNOSIS — I361 Nonrheumatic tricuspid (valve) insufficiency: Secondary | ICD-10-CM | POA: Diagnosis not present

## 2019-03-19 DIAGNOSIS — I69320 Aphasia following cerebral infarction: Secondary | ICD-10-CM | POA: Diagnosis not present

## 2019-03-19 DIAGNOSIS — I482 Chronic atrial fibrillation, unspecified: Secondary | ICD-10-CM | POA: Diagnosis not present

## 2019-03-19 DIAGNOSIS — I5032 Chronic diastolic (congestive) heart failure: Secondary | ICD-10-CM | POA: Diagnosis not present

## 2019-03-19 DIAGNOSIS — I110 Hypertensive heart disease with heart failure: Secondary | ICD-10-CM | POA: Diagnosis not present

## 2019-03-19 DIAGNOSIS — G459 Transient cerebral ischemic attack, unspecified: Secondary | ICD-10-CM | POA: Diagnosis not present

## 2019-03-20 DIAGNOSIS — I5032 Chronic diastolic (congestive) heart failure: Secondary | ICD-10-CM | POA: Diagnosis not present

## 2019-03-20 DIAGNOSIS — I110 Hypertensive heart disease with heart failure: Secondary | ICD-10-CM | POA: Diagnosis not present

## 2019-03-20 DIAGNOSIS — I69320 Aphasia following cerebral infarction: Secondary | ICD-10-CM | POA: Diagnosis not present

## 2019-03-20 DIAGNOSIS — I361 Nonrheumatic tricuspid (valve) insufficiency: Secondary | ICD-10-CM | POA: Diagnosis not present

## 2019-03-20 DIAGNOSIS — G459 Transient cerebral ischemic attack, unspecified: Secondary | ICD-10-CM | POA: Diagnosis not present

## 2019-03-20 DIAGNOSIS — I482 Chronic atrial fibrillation, unspecified: Secondary | ICD-10-CM | POA: Diagnosis not present

## 2019-03-21 DIAGNOSIS — I110 Hypertensive heart disease with heart failure: Secondary | ICD-10-CM | POA: Diagnosis not present

## 2019-03-21 DIAGNOSIS — I482 Chronic atrial fibrillation, unspecified: Secondary | ICD-10-CM | POA: Diagnosis not present

## 2019-03-21 DIAGNOSIS — G459 Transient cerebral ischemic attack, unspecified: Secondary | ICD-10-CM | POA: Diagnosis not present

## 2019-03-21 DIAGNOSIS — I69320 Aphasia following cerebral infarction: Secondary | ICD-10-CM | POA: Diagnosis not present

## 2019-03-21 DIAGNOSIS — I5032 Chronic diastolic (congestive) heart failure: Secondary | ICD-10-CM | POA: Diagnosis not present

## 2019-03-21 DIAGNOSIS — I361 Nonrheumatic tricuspid (valve) insufficiency: Secondary | ICD-10-CM | POA: Diagnosis not present

## 2019-03-22 DIAGNOSIS — I110 Hypertensive heart disease with heart failure: Secondary | ICD-10-CM | POA: Diagnosis not present

## 2019-03-22 DIAGNOSIS — I5032 Chronic diastolic (congestive) heart failure: Secondary | ICD-10-CM | POA: Diagnosis not present

## 2019-03-22 DIAGNOSIS — I69320 Aphasia following cerebral infarction: Secondary | ICD-10-CM | POA: Diagnosis not present

## 2019-03-22 DIAGNOSIS — G459 Transient cerebral ischemic attack, unspecified: Secondary | ICD-10-CM | POA: Diagnosis not present

## 2019-03-22 DIAGNOSIS — I361 Nonrheumatic tricuspid (valve) insufficiency: Secondary | ICD-10-CM | POA: Diagnosis not present

## 2019-03-22 DIAGNOSIS — I482 Chronic atrial fibrillation, unspecified: Secondary | ICD-10-CM | POA: Diagnosis not present

## 2019-03-25 ENCOUNTER — Encounter: Payer: Self-pay | Admitting: *Deleted

## 2019-03-25 ENCOUNTER — Other Ambulatory Visit: Payer: Self-pay | Admitting: *Deleted

## 2019-03-25 NOTE — Patient Outreach (Signed)
[place] [facility]) Care Management  03/25/2019  Jean Ruiz 04-25-1935 [id number]  Transition of care call   Successful outreach call to patient , patient reports feeling pretty good on today,states she is getting stronger.  Patient discussed planned Physical therapy visit by [facility] on tomorrow.  Patient discussed using her rolling walker all the time now but hopeful for transition to cane as progress per therapy .  Patient discussed having a good appetite, friends/family providing food, and patient reports drinking 2 boost a day. Patient discussed that her scales are not working well , she will have her niece check them out so she can monitor her weights at home to make sure she is putting on some of weight that she has lost.   Discussed diagnosis of TIA ,in relation to history of atrial fib, review of symptoms, patient denies any symptoms weakness, dizziness , denies speech difficulties or swallowing problems.   Patient reports continuing to take her medications as prescribed using her pill organizer, she reports home health RN assisted her in the last week and will do so again on this week, patient discussed that she will be able to do this on her own over the next few weeks is her goal. Discussed pharmacy programs that are available to do pill packaging , she declines wanting to use this service at this time. Patient reviewing medication list at this time patient declined requesting to review at next visit due to expected call on today.   Patient discussed that she has contacted [facility] regarding Grief counseling program  and has planned telephone visit on this afternoon. Patient discussed that her dog is sick also , she thinks he may be grieving also related to her spouse recent death. Patient reporting plans to take dog to vet today,her niece will provide transportation , they will remain in the car during the visit.    Patient denies any new concerns at this  time.  Plan Will plan call in the next week as part of transition of care .  [facility] CM Care Plan Problem One     Most Recent Value  Care Plan Problem One  Recent hospital admission and rehab stay related to weakness, possible TIA, UTI   Role Documenting the Problem One  Care Management [place] for Problem One  Active  THN Long Term Goal   Patient will not experience a hospital readmission in the next 60 days   THN Long Term Goal Start Date  03/18/19  Interventions for Problem One Long Term Goal  Reviewed current clinical state reinforced taking medications as prescribed, Advised seeking medical attention for any new symptoms of TIA,. Will send THN book on atrial fib   THN CM Short Term Goal #1   Patient will be able to report weight gain of 2 pounds over the next 30 days   THN CM Short Term Goal #1 Start Date  03/18/19  Interventions for Short Term Goal #1  Discussed recent nutrition intake, praised for progress with adding boost to supplement diet. Encouraged patient to follow up on checking batteries for scales to montior weight progress   THN CM Short Term Goal #2   Over the next 30 days patient will be able to report increase in strength  and balance to complete daily ADL's.   THN CM Short Term Goal #2 Start Date  03/18/19  Interventions for Short Term Goal #2  Encouraged regarding to use walker until cleared by home  PT,   THN CM Short Term Goal #3  Patient will be able to identify 3 sign/symptoms of stroke/tia over the next 30 days   THN CM Short Term Goal #3 Start Date  03/25/19  Interventions for Short Tern Goal #3  Reviewed symptoms of TIA in relation her recent occurence and atrial fib history  risk factor, symptoms that may last a few minutes to hours,  weakness, slurred speech,droopy face ,dizziness, trouble swallowing, difficutly understanding others         [name], RN, [place] Management Coordinator  [phone]  Mobile [phone]- [place]

## 2019-03-26 DIAGNOSIS — I110 Hypertensive heart disease with heart failure: Secondary | ICD-10-CM | POA: Diagnosis not present

## 2019-03-26 DIAGNOSIS — I361 Nonrheumatic tricuspid (valve) insufficiency: Secondary | ICD-10-CM | POA: Diagnosis not present

## 2019-03-26 DIAGNOSIS — G459 Transient cerebral ischemic attack, unspecified: Secondary | ICD-10-CM | POA: Diagnosis not present

## 2019-03-26 DIAGNOSIS — I69320 Aphasia following cerebral infarction: Secondary | ICD-10-CM | POA: Diagnosis not present

## 2019-03-26 DIAGNOSIS — I482 Chronic atrial fibrillation, unspecified: Secondary | ICD-10-CM | POA: Diagnosis not present

## 2019-03-26 DIAGNOSIS — I5032 Chronic diastolic (congestive) heart failure: Secondary | ICD-10-CM | POA: Diagnosis not present

## 2019-03-28 DIAGNOSIS — I361 Nonrheumatic tricuspid (valve) insufficiency: Secondary | ICD-10-CM | POA: Diagnosis not present

## 2019-03-28 DIAGNOSIS — I5032 Chronic diastolic (congestive) heart failure: Secondary | ICD-10-CM | POA: Diagnosis not present

## 2019-03-28 DIAGNOSIS — G459 Transient cerebral ischemic attack, unspecified: Secondary | ICD-10-CM | POA: Diagnosis not present

## 2019-03-28 DIAGNOSIS — I482 Chronic atrial fibrillation, unspecified: Secondary | ICD-10-CM | POA: Diagnosis not present

## 2019-03-28 DIAGNOSIS — I110 Hypertensive heart disease with heart failure: Secondary | ICD-10-CM | POA: Diagnosis not present

## 2019-03-28 DIAGNOSIS — I69320 Aphasia following cerebral infarction: Secondary | ICD-10-CM | POA: Diagnosis not present

## 2019-03-29 DIAGNOSIS — I69320 Aphasia following cerebral infarction: Secondary | ICD-10-CM | POA: Diagnosis not present

## 2019-03-29 DIAGNOSIS — I482 Chronic atrial fibrillation, unspecified: Secondary | ICD-10-CM | POA: Diagnosis not present

## 2019-03-29 DIAGNOSIS — I361 Nonrheumatic tricuspid (valve) insufficiency: Secondary | ICD-10-CM | POA: Diagnosis not present

## 2019-03-29 DIAGNOSIS — I110 Hypertensive heart disease with heart failure: Secondary | ICD-10-CM | POA: Diagnosis not present

## 2019-03-29 DIAGNOSIS — G459 Transient cerebral ischemic attack, unspecified: Secondary | ICD-10-CM | POA: Diagnosis not present

## 2019-03-29 DIAGNOSIS — I5032 Chronic diastolic (congestive) heart failure: Secondary | ICD-10-CM | POA: Diagnosis not present

## 2019-04-01 ENCOUNTER — Other Ambulatory Visit: Payer: Self-pay | Admitting: *Deleted

## 2019-04-01 DIAGNOSIS — I482 Chronic atrial fibrillation, unspecified: Secondary | ICD-10-CM | POA: Diagnosis not present

## 2019-04-01 DIAGNOSIS — G459 Transient cerebral ischemic attack, unspecified: Secondary | ICD-10-CM | POA: Diagnosis not present

## 2019-04-01 DIAGNOSIS — I69320 Aphasia following cerebral infarction: Secondary | ICD-10-CM | POA: Diagnosis not present

## 2019-04-01 DIAGNOSIS — I361 Nonrheumatic tricuspid (valve) insufficiency: Secondary | ICD-10-CM | POA: Diagnosis not present

## 2019-04-01 DIAGNOSIS — I5032 Chronic diastolic (congestive) heart failure: Secondary | ICD-10-CM | POA: Diagnosis not present

## 2019-04-01 DIAGNOSIS — I110 Hypertensive heart disease with heart failure: Secondary | ICD-10-CM | POA: Diagnosis not present

## 2019-04-01 NOTE — Patient Outreach (Addendum)
[place] [facility]) Care Management  [facility] Care Manager  04/01/2019   Jean Ruiz [birth year] [id number]    Transition of care   Referral received : 03/13/19 Referral source : [facility] UM, post acute care coordinator Referral reason : [facility], [facility], Hx; Diastolic heart failure , atrial fib, R/o CVA, possible TIA .   Review of KPN for PHX that includes but not limited to : Hypertension, hyperlipidemia, atrial fib, diastolic heart failure .    Subjective:   Patient discussed feeling better now shared having a upset stomach earlier , due to sinus drainage, patient denies having fever, increased cough. Patient reports decreased appetite this morning but plans to eat soup for lunch.   Patient discussed still been followed by home health RN , PT and OT and getting stronger.  Patient reports home health RN visited on today.  Patient reports continuing to stay at home as recommended and has someone staying at night with her, and friends has provided foods for her .   Encounter Medications:  Outpatient Encounter Medications as of 04/01/2019  Medication Sig Note  . aspirin 325 MG EC tablet Take 325 mg by mouth daily.   . ciprofloxacin (CIPRO) 500 MG tablet Take 500 mg by mouth 2 (two) times daily.   . cyanocobalamin (,VITAMIN B-12,) 1000 MCG/ML injection INJECT ONE MILLILITER WEEKLY FOR FOUR WEEKS THEN, ONE MILLILITER EVERY MONTH 04/04/2016: Received from: External Pharmacy  . furosemide (LASIX) 40 MG tablet Take 40 mg by mouth 2 (two) times daily.    . hydrALAZINE (APRESOLINE) 10 MG tablet Take 10 mg by mouth daily.    . Levothyroxine Sodium (SYNTHROID PO) Take 125 mcg/day by mouth daily.    Marland Kitchen lisinopril (PRINIVIL,ZESTRIL) 40 MG tablet 40 mg daily.  04/04/2016: Received from: External Pharmacy  . LORazepam (ATIVAN) 0.5 MG tablet Take 0.5 mg by mouth daily as needed.  04/04/2016: Received from: External Pharmacy  . metoprolol succinate (TOPROL-XL) 25 MG 24 hr tablet Take  100 mg by mouth 2 (two) times daily.    Marland Kitchen ofloxacin (FLOXIN) 0.3 % OTIC solution 5 drops daily.   . potassium chloride SA (K-DUR,KLOR-CON) 20 MEQ tablet Take 20 mEq by mouth daily.   . [DISCONTINUED] amLODipine (NORVASC) 5 MG tablet Take 5 mg by mouth. 04/04/2016: Received from: [facility]   No facility-administered encounter medications on file as of 04/01/2019.     Functional Status:  In your present state of health, do you have any difficulty performing the following activities: 03/18/2019 04/23/2018  Hearing? N N  Vision? N N  Difficulty concentrating or making decisions? N N  Walking or climbing stairs? Y Y  Comment using a walker now  using a walker now and home PT  Dressing or bathing? Y N  Comment needs assist, family helps at times  -  Doing errands, shopping? Tempie Donning  Comment family friend assist  family assist   Preparing Food and eating ? Tempie Donning  Comment family friends bring food , she is able to warm up , and has assist with breakfast prep family assisting   Using the Toilet? N N  In the past six months, have you accidently leaked urine? Y N  Do you have problems with loss of bowel control? N N  Managing your Medications? Y N  Managing your Finances? Y N  Housekeeping or managing your Housekeeping? Y Y  Comment family/friend assist  has hired help once weekly  Some recent data  might be hidden    Fall/Depression Screening: Fall Risk  03/18/2019 05/15/2018 04/23/2018  Falls in the past year? 1 - Yes  Number falls in past yr: 0 - 1  Injury with Fall? 0 - No  Risk for fall due to : Impaired balance/gait Impaired balance/gait History of fall(s)   PHQ 2/9 Scores 03/18/2019 04/23/2018  PHQ - 2 Score 1 0    Assessment:   Recent Admission Dx TIA No new symptoms, review of FAST and action plan.  Patient continues with home health services , RN ,PT and OT she reports increased strength and using walker at all times  Grief Patient is connected with [place] for grief  counseling appointment in the next week.  Nutrition  Continuing to tolerate diet, needs new scales to monitor weight and will plan to ask family to assist  . Medications Patient declined being able to review medication list on today, HHRN has assisted with filling 2 week supply, Discussed benefit of pill packaging services.Patient is hopeful to be able to do it on her own with the help with of POA.   Patient denies any new concerns at this time.   Plan:  Will plan follow up transition of care call in the next week.  Will send PCP visit note after medication review.   [facility] CM Care Plan Problem One     Most Recent Value  Care Plan Problem One  Recent hospital admission and rehab stay related to weakness, possible TIA, UTI   Role Documenting the Problem One  Care Management [place] for Problem One  Active  THN Long Term Goal   Patient will not experience a hospital readmission in the next 60 days   THN Long Term Goal Start Date  03/18/19  Interventions for Problem One Long Term Goal  Dicussed current clinical state, advised regarding notiyfing MD of unresolved symptoms related to her sinus problem.   THN CM Short Term Goal #1   Patient will be able to report weight gain of 2 pounds over the next 30 days   THN CM Short Term Goal #1 Start Date  03/18/19  Interventions for Short Term Goal #1  Reviewed recent weighs, patient has problem with scales,, reviewed best time of day to weigh  [facility] CM Short Term Goal #2   Over the next 30 days patient will be able to report increase in strength  and balance to complete daily ADL's.   THN CM Short Term Goal #2 Start Date  03/18/19  Interventions for Short Term Goal #2  Reinforced continued participation in home therapy exercises   THN CM Short Term Goal #3  Patient will be able to identify 3 sign/symptoms of stroke/tia over the next 30 days   THN CM Short Term Goal #3 Start Date  03/25/19  Interventions for Short Tern Goal #3  Confirmed  patient received book on atrial fib and FAST signs of Stroke       [name], RN, [place] Management Coordinator  [phone]- Mobile [phone]- [place]

## 2019-04-02 DIAGNOSIS — I361 Nonrheumatic tricuspid (valve) insufficiency: Secondary | ICD-10-CM | POA: Diagnosis not present

## 2019-04-02 DIAGNOSIS — I110 Hypertensive heart disease with heart failure: Secondary | ICD-10-CM | POA: Diagnosis not present

## 2019-04-02 DIAGNOSIS — G459 Transient cerebral ischemic attack, unspecified: Secondary | ICD-10-CM | POA: Diagnosis not present

## 2019-04-02 DIAGNOSIS — I482 Chronic atrial fibrillation, unspecified: Secondary | ICD-10-CM | POA: Diagnosis not present

## 2019-04-02 DIAGNOSIS — I69320 Aphasia following cerebral infarction: Secondary | ICD-10-CM | POA: Diagnosis not present

## 2019-04-02 DIAGNOSIS — I5032 Chronic diastolic (congestive) heart failure: Secondary | ICD-10-CM | POA: Diagnosis not present

## 2019-04-04 DIAGNOSIS — I482 Chronic atrial fibrillation, unspecified: Secondary | ICD-10-CM | POA: Diagnosis not present

## 2019-04-04 DIAGNOSIS — G459 Transient cerebral ischemic attack, unspecified: Secondary | ICD-10-CM | POA: Diagnosis not present

## 2019-04-04 DIAGNOSIS — I5032 Chronic diastolic (congestive) heart failure: Secondary | ICD-10-CM | POA: Diagnosis not present

## 2019-04-04 DIAGNOSIS — I69320 Aphasia following cerebral infarction: Secondary | ICD-10-CM | POA: Diagnosis not present

## 2019-04-04 DIAGNOSIS — I110 Hypertensive heart disease with heart failure: Secondary | ICD-10-CM | POA: Diagnosis not present

## 2019-04-04 DIAGNOSIS — I361 Nonrheumatic tricuspid (valve) insufficiency: Secondary | ICD-10-CM | POA: Diagnosis not present

## 2019-04-08 DIAGNOSIS — I482 Chronic atrial fibrillation, unspecified: Secondary | ICD-10-CM | POA: Diagnosis not present

## 2019-04-08 DIAGNOSIS — I69320 Aphasia following cerebral infarction: Secondary | ICD-10-CM | POA: Diagnosis not present

## 2019-04-08 DIAGNOSIS — I361 Nonrheumatic tricuspid (valve) insufficiency: Secondary | ICD-10-CM | POA: Diagnosis not present

## 2019-04-08 DIAGNOSIS — I5032 Chronic diastolic (congestive) heart failure: Secondary | ICD-10-CM | POA: Diagnosis not present

## 2019-04-08 DIAGNOSIS — I110 Hypertensive heart disease with heart failure: Secondary | ICD-10-CM | POA: Diagnosis not present

## 2019-04-08 DIAGNOSIS — G459 Transient cerebral ischemic attack, unspecified: Secondary | ICD-10-CM | POA: Diagnosis not present

## 2019-04-09 ENCOUNTER — Other Ambulatory Visit: Payer: Self-pay | Admitting: *Deleted

## 2019-04-09 DIAGNOSIS — G459 Transient cerebral ischemic attack, unspecified: Secondary | ICD-10-CM | POA: Diagnosis not present

## 2019-04-09 DIAGNOSIS — I361 Nonrheumatic tricuspid (valve) insufficiency: Secondary | ICD-10-CM | POA: Diagnosis not present

## 2019-04-09 DIAGNOSIS — I69320 Aphasia following cerebral infarction: Secondary | ICD-10-CM | POA: Diagnosis not present

## 2019-04-09 DIAGNOSIS — I5032 Chronic diastolic (congestive) heart failure: Secondary | ICD-10-CM | POA: Diagnosis not present

## 2019-04-09 DIAGNOSIS — I482 Chronic atrial fibrillation, unspecified: Secondary | ICD-10-CM | POA: Diagnosis not present

## 2019-04-09 DIAGNOSIS — R197 Diarrhea, unspecified: Secondary | ICD-10-CM | POA: Diagnosis not present

## 2019-04-09 DIAGNOSIS — I110 Hypertensive heart disease with heart failure: Secondary | ICD-10-CM | POA: Diagnosis not present

## 2019-04-09 NOTE — Patient Outreach (Signed)
[place] [facility]) Care Management  04/09/2019  Jean Ruiz 1935/01/12 [id number]   Transition of care call    Referral received : 03/13/19 Referral source : [facility] UM, post acute care coordinator Referral reason : [facility], [facility], Hx; Diastolic heart failure , atrial fib, R/o CVA, possible TIA .  Review of KPN for PHX that includes but not limited to: Hypertension, hyperlipidemia, atrial fib, diastolic heart failure .    Successful outreach call to patient , she reports that she is not feeling well on today states that she is feeling weak.  Patient discussed having diarrhea on today, and for one to 2 times on and off for the last week.  Patient denies having dizziness , shortness of breath, fever, .  Discussed with patient importance of notifying MD regarding current symptoms, risk of dehydration . Discussed me contacting PCP office ,She reports home health RN is on her way and after she checks me out and then she will call.  Patient states physical therapist thought she may be getting a little dehydrated this morning at visit.  Patient states that she has been able to tolerate liquids well.   Patient again  unable to review medication list at this time.   Plan Will plan outreach to patient in the next 2 business days and care coordination with [facility]  HHRN as needed.    [name], RN, [place] Management Coordinator  [phone]- Mobile [phone]- Toll Free Main Office

## 2019-04-10 ENCOUNTER — Other Ambulatory Visit: Payer: Self-pay | Admitting: *Deleted

## 2019-04-10 NOTE — Patient Outreach (Signed)
[place] [facility]) Care Management  04/10/2019  Jean Ruiz 1935-08-17 [id number]   Telephone Follow up   Outreach call to patient, to follow up on patient complaint of diarrhea on yesterday.  Patient reports that she spoke with Dr. Delena Bali on yesterday, patient reports no new changes in medication. Patient reports feeling better on today, denies diarrhea. She discussed diarrhea could be related to drinking boost twice daily and she plans to hold off today on that , discussed reintroducing one a day as diarrhea resolved , review of other nutrition supplement and protein sources as tolerated.  Patient reports that she has been tolerating meals and liquids since then. Patient discussed being upset do to having to put her dog down this morning  due to liver illness, she discussed that he is not suffering now, emotional support provided.  Patient discussed having telephone visit with Hospice grief counseling in the next week , due recent death of her spouse, she shared how much her spouse really loved the dog, she states having her sister in law with her now.   Placed care coordination call to  [facility] home health to discuss patient visit on yesterday. Spoke with RN Earnest Bailey that contacted PCP office on yesterday regarding patient concern of diarrhea, no changes to medication except advised regarding taking immodium for diarrhea and notify MD office if it does not help.  Nurse reports that she continues to work with patient on filling pill organizer and discussed assessment of patient ability to Administrator or family available. Discussed that I have mentioned to patient possibility of pill packaging service but she wants to be able to do on her own.   Plan Will plan return call in the next week for final transition of care call . Encouraged patient to notify MD of recurrent diarrhea episode, reviewed signs of dehydration to notify MD of increased weakness, dizziness, decreased urine  output,reviewed the  importance of adequate fluid and nutrition intake     [name], RN, [place] Management Coordinator  [phone]- Mobile [phone]- [place]

## 2019-04-11 DIAGNOSIS — D692 Other nonthrombocytopenic purpura: Secondary | ICD-10-CM | POA: Diagnosis not present

## 2019-04-11 DIAGNOSIS — I69320 Aphasia following cerebral infarction: Secondary | ICD-10-CM | POA: Diagnosis not present

## 2019-04-11 DIAGNOSIS — Z7902 Long term (current) use of antithrombotics/antiplatelets: Secondary | ICD-10-CM | POA: Diagnosis not present

## 2019-04-11 DIAGNOSIS — I361 Nonrheumatic tricuspid (valve) insufficiency: Secondary | ICD-10-CM | POA: Diagnosis not present

## 2019-04-11 DIAGNOSIS — K219 Gastro-esophageal reflux disease without esophagitis: Secondary | ICD-10-CM | POA: Diagnosis not present

## 2019-04-11 DIAGNOSIS — E039 Hypothyroidism, unspecified: Secondary | ICD-10-CM | POA: Diagnosis not present

## 2019-04-11 DIAGNOSIS — Z8719 Personal history of other diseases of the digestive system: Secondary | ICD-10-CM | POA: Diagnosis not present

## 2019-04-11 DIAGNOSIS — M25532 Pain in left wrist: Secondary | ICD-10-CM | POA: Diagnosis not present

## 2019-04-11 DIAGNOSIS — R197 Diarrhea, unspecified: Secondary | ICD-10-CM | POA: Diagnosis not present

## 2019-04-11 DIAGNOSIS — M150 Primary generalized (osteo)arthritis: Secondary | ICD-10-CM | POA: Diagnosis not present

## 2019-04-11 DIAGNOSIS — I110 Hypertensive heart disease with heart failure: Secondary | ICD-10-CM | POA: Diagnosis not present

## 2019-04-11 DIAGNOSIS — G459 Transient cerebral ischemic attack, unspecified: Secondary | ICD-10-CM | POA: Diagnosis not present

## 2019-04-11 DIAGNOSIS — I5032 Chronic diastolic (congestive) heart failure: Secondary | ICD-10-CM | POA: Diagnosis not present

## 2019-04-11 DIAGNOSIS — I482 Chronic atrial fibrillation, unspecified: Secondary | ICD-10-CM | POA: Diagnosis not present

## 2019-04-11 DIAGNOSIS — R279 Unspecified lack of coordination: Secondary | ICD-10-CM | POA: Diagnosis not present

## 2019-04-12 DIAGNOSIS — I69320 Aphasia following cerebral infarction: Secondary | ICD-10-CM | POA: Diagnosis not present

## 2019-04-12 DIAGNOSIS — I482 Chronic atrial fibrillation, unspecified: Secondary | ICD-10-CM | POA: Diagnosis not present

## 2019-04-12 DIAGNOSIS — G459 Transient cerebral ischemic attack, unspecified: Secondary | ICD-10-CM | POA: Diagnosis not present

## 2019-04-12 DIAGNOSIS — I110 Hypertensive heart disease with heart failure: Secondary | ICD-10-CM | POA: Diagnosis not present

## 2019-04-12 DIAGNOSIS — I5032 Chronic diastolic (congestive) heart failure: Secondary | ICD-10-CM | POA: Diagnosis not present

## 2019-04-12 DIAGNOSIS — I361 Nonrheumatic tricuspid (valve) insufficiency: Secondary | ICD-10-CM | POA: Diagnosis not present

## 2019-04-15 DIAGNOSIS — G459 Transient cerebral ischemic attack, unspecified: Secondary | ICD-10-CM | POA: Diagnosis not present

## 2019-04-15 DIAGNOSIS — I361 Nonrheumatic tricuspid (valve) insufficiency: Secondary | ICD-10-CM | POA: Diagnosis not present

## 2019-04-15 DIAGNOSIS — I5032 Chronic diastolic (congestive) heart failure: Secondary | ICD-10-CM | POA: Diagnosis not present

## 2019-04-15 DIAGNOSIS — I69320 Aphasia following cerebral infarction: Secondary | ICD-10-CM | POA: Diagnosis not present

## 2019-04-15 DIAGNOSIS — I110 Hypertensive heart disease with heart failure: Secondary | ICD-10-CM | POA: Diagnosis not present

## 2019-04-15 DIAGNOSIS — I482 Chronic atrial fibrillation, unspecified: Secondary | ICD-10-CM | POA: Diagnosis not present

## 2019-04-17 DIAGNOSIS — I69320 Aphasia following cerebral infarction: Secondary | ICD-10-CM | POA: Diagnosis not present

## 2019-04-17 DIAGNOSIS — I110 Hypertensive heart disease with heart failure: Secondary | ICD-10-CM | POA: Diagnosis not present

## 2019-04-17 DIAGNOSIS — I482 Chronic atrial fibrillation, unspecified: Secondary | ICD-10-CM | POA: Diagnosis not present

## 2019-04-17 DIAGNOSIS — I361 Nonrheumatic tricuspid (valve) insufficiency: Secondary | ICD-10-CM | POA: Diagnosis not present

## 2019-04-17 DIAGNOSIS — G459 Transient cerebral ischemic attack, unspecified: Secondary | ICD-10-CM | POA: Diagnosis not present

## 2019-04-17 DIAGNOSIS — I5032 Chronic diastolic (congestive) heart failure: Secondary | ICD-10-CM | POA: Diagnosis not present

## 2019-04-18 ENCOUNTER — Other Ambulatory Visit: Payer: Self-pay | Admitting: *Deleted

## 2019-04-18 NOTE — Patient Outreach (Signed)
[place] [facility]) Care Management  04/18/2019  Jean Ruiz 1935/04/19 [id number]   Transition of care   Referral received : 03/13/19 Referral source : [facility] UM, post acute care coordinator Referral reason : [facility], [facility], Hx; Diastolic heart failure , atrial fib, R/o CVA, possible TIA .  Review of KPN for PHX that includes but not limited to: Hypertension, hyperlipidemia, atrial fib, diastolic heart failure .    Successful outreach call to patient she reports feeling okay on today.  Patient discussed improvement with diarrhea, denies having daily diarrhea, taking immodium AD as needed. She reports having a stools sample sent and received report from MD office that everything was okay.   Patient further discussed :  Recent TIA No new symptoms, reports improving in strength, continues with home health physical therapy. Patient reports using a cane during the day and keeping a walker by her bedside at night.  Patient discussed being eager to get back to doing more for herself , back to driving, encouraged to discuss with PCP and physical therapy prior to resuming driving. Reviewed safety precautions and reasons for not driving until safe.  Nutrition  Patient reports that she continues to eat well, meals provided by family and friends. Patient reports that she has lost a few pounds about 5, reports current weight is 119 on home scales, she does not believe that they are accurate weighing less than what she weighs. Patient reports she continues to tolerate drinking boost between meals twice daily. Patient agreeable to asking her niece about checking on her scales or purchasing new one for her.  Medications  Patient reports home health RN has filled her pill organizer on this week and patient discussed that she will be able to do this on her own. I discussed option for pill packaging to help with daily ease of medication organization patient has declined.    Appointments Patient plans to call PCP office regarding next scheduled visit, offered to assist she declines.   Patient denies any new concerns at this time, she has completed transition of care program. Will continue to follow in complex care management .   Plan  Will plan follow up call in the next 3 weeks    [facility] CM Care Plan Problem One     Most Recent Value  Care Plan Problem One  Recent hospital admission and rehab stay related to weakness, possible TIA, UTI   Role Documenting the Problem One  Care Management [place] for Problem One  Active  [facility] Long Term Goal   Patient will not experience a hospital readmission in the next 60 days   THN Long Term Goal Start Date  03/18/19  Interventions for Problem One Long Term Goal  Discussed current clinical symptoms, reinforced notifying MD of reoccurence of diarrhea. Reviewed recieving THN atrial fib book and teachback on sign of TIA .   THN CM Short Term Goal #1   Patient will be able to report weight gain of 2 pounds over the next 30 days   THN CM Short Term Goal #1 Start Date  04/17/19 [facility] reset ]  Interventions for Short Term Goal #1  Again reinforced importance of daily balanced nutrtion, including protien and benefits  in increase strenght, help with healing. Assessed ability to get new scales .   THN CM Short Term Goal #2   Over the next 30 days patient will be able to report increase in strength  and balance  to complete daily ADL's.   THN CM Short Term Goal #2 Start Date  03/18/19  THN CM Short Term Goal #2 Met Date  04/18/19  THN CM Short Term Goal #3  Patient will be able to identify 3 sign/symptoms of stroke/tia over the next 30 days   THN CM Short Term Goal #3 Start Date  03/25/19  [facility] CM Short Term Goal #3 Met Date  04/18/19      [name], RN, [place] Management Coordinator  [phone]- Mobile [phone]- [place]

## 2019-04-19 DIAGNOSIS — G459 Transient cerebral ischemic attack, unspecified: Secondary | ICD-10-CM | POA: Diagnosis not present

## 2019-04-19 DIAGNOSIS — I110 Hypertensive heart disease with heart failure: Secondary | ICD-10-CM | POA: Diagnosis not present

## 2019-04-19 DIAGNOSIS — I361 Nonrheumatic tricuspid (valve) insufficiency: Secondary | ICD-10-CM | POA: Diagnosis not present

## 2019-04-19 DIAGNOSIS — I5032 Chronic diastolic (congestive) heart failure: Secondary | ICD-10-CM | POA: Diagnosis not present

## 2019-04-19 DIAGNOSIS — I482 Chronic atrial fibrillation, unspecified: Secondary | ICD-10-CM | POA: Diagnosis not present

## 2019-04-19 DIAGNOSIS — I69320 Aphasia following cerebral infarction: Secondary | ICD-10-CM | POA: Diagnosis not present

## 2019-04-22 DIAGNOSIS — I482 Chronic atrial fibrillation, unspecified: Secondary | ICD-10-CM | POA: Diagnosis not present

## 2019-04-22 DIAGNOSIS — G459 Transient cerebral ischemic attack, unspecified: Secondary | ICD-10-CM | POA: Diagnosis not present

## 2019-04-22 DIAGNOSIS — I361 Nonrheumatic tricuspid (valve) insufficiency: Secondary | ICD-10-CM | POA: Diagnosis not present

## 2019-04-22 DIAGNOSIS — I110 Hypertensive heart disease with heart failure: Secondary | ICD-10-CM | POA: Diagnosis not present

## 2019-04-22 DIAGNOSIS — I5032 Chronic diastolic (congestive) heart failure: Secondary | ICD-10-CM | POA: Diagnosis not present

## 2019-04-22 DIAGNOSIS — I69320 Aphasia following cerebral infarction: Secondary | ICD-10-CM | POA: Diagnosis not present

## 2019-04-23 DIAGNOSIS — I5032 Chronic diastolic (congestive) heart failure: Secondary | ICD-10-CM | POA: Diagnosis not present

## 2019-04-23 DIAGNOSIS — I361 Nonrheumatic tricuspid (valve) insufficiency: Secondary | ICD-10-CM | POA: Diagnosis not present

## 2019-04-23 DIAGNOSIS — I482 Chronic atrial fibrillation, unspecified: Secondary | ICD-10-CM | POA: Diagnosis not present

## 2019-04-23 DIAGNOSIS — I69320 Aphasia following cerebral infarction: Secondary | ICD-10-CM | POA: Diagnosis not present

## 2019-04-23 DIAGNOSIS — G459 Transient cerebral ischemic attack, unspecified: Secondary | ICD-10-CM | POA: Diagnosis not present

## 2019-04-23 DIAGNOSIS — I110 Hypertensive heart disease with heart failure: Secondary | ICD-10-CM | POA: Diagnosis not present

## 2019-04-24 DIAGNOSIS — G459 Transient cerebral ischemic attack, unspecified: Secondary | ICD-10-CM | POA: Diagnosis not present

## 2019-04-24 DIAGNOSIS — I69320 Aphasia following cerebral infarction: Secondary | ICD-10-CM | POA: Diagnosis not present

## 2019-04-24 DIAGNOSIS — I5032 Chronic diastolic (congestive) heart failure: Secondary | ICD-10-CM | POA: Diagnosis not present

## 2019-04-24 DIAGNOSIS — I361 Nonrheumatic tricuspid (valve) insufficiency: Secondary | ICD-10-CM | POA: Diagnosis not present

## 2019-04-24 DIAGNOSIS — I110 Hypertensive heart disease with heart failure: Secondary | ICD-10-CM | POA: Diagnosis not present

## 2019-04-24 DIAGNOSIS — I482 Chronic atrial fibrillation, unspecified: Secondary | ICD-10-CM | POA: Diagnosis not present

## 2019-04-25 DIAGNOSIS — I5032 Chronic diastolic (congestive) heart failure: Secondary | ICD-10-CM | POA: Diagnosis not present

## 2019-04-25 DIAGNOSIS — I69320 Aphasia following cerebral infarction: Secondary | ICD-10-CM | POA: Diagnosis not present

## 2019-04-25 DIAGNOSIS — I110 Hypertensive heart disease with heart failure: Secondary | ICD-10-CM | POA: Diagnosis not present

## 2019-04-25 DIAGNOSIS — G459 Transient cerebral ischemic attack, unspecified: Secondary | ICD-10-CM | POA: Diagnosis not present

## 2019-04-25 DIAGNOSIS — I361 Nonrheumatic tricuspid (valve) insufficiency: Secondary | ICD-10-CM | POA: Diagnosis not present

## 2019-04-25 DIAGNOSIS — I482 Chronic atrial fibrillation, unspecified: Secondary | ICD-10-CM | POA: Diagnosis not present

## 2019-04-30 DIAGNOSIS — I5032 Chronic diastolic (congestive) heart failure: Secondary | ICD-10-CM | POA: Diagnosis not present

## 2019-04-30 DIAGNOSIS — G459 Transient cerebral ischemic attack, unspecified: Secondary | ICD-10-CM | POA: Diagnosis not present

## 2019-04-30 DIAGNOSIS — I110 Hypertensive heart disease with heart failure: Secondary | ICD-10-CM | POA: Diagnosis not present

## 2019-04-30 DIAGNOSIS — I69320 Aphasia following cerebral infarction: Secondary | ICD-10-CM | POA: Diagnosis not present

## 2019-04-30 DIAGNOSIS — I482 Chronic atrial fibrillation, unspecified: Secondary | ICD-10-CM | POA: Diagnosis not present

## 2019-04-30 DIAGNOSIS — I361 Nonrheumatic tricuspid (valve) insufficiency: Secondary | ICD-10-CM | POA: Diagnosis not present

## 2019-05-02 DIAGNOSIS — I69320 Aphasia following cerebral infarction: Secondary | ICD-10-CM | POA: Diagnosis not present

## 2019-05-02 DIAGNOSIS — I110 Hypertensive heart disease with heart failure: Secondary | ICD-10-CM | POA: Diagnosis not present

## 2019-05-02 DIAGNOSIS — G459 Transient cerebral ischemic attack, unspecified: Secondary | ICD-10-CM | POA: Diagnosis not present

## 2019-05-02 DIAGNOSIS — I482 Chronic atrial fibrillation, unspecified: Secondary | ICD-10-CM | POA: Diagnosis not present

## 2019-05-02 DIAGNOSIS — I5032 Chronic diastolic (congestive) heart failure: Secondary | ICD-10-CM | POA: Diagnosis not present

## 2019-05-02 DIAGNOSIS — I361 Nonrheumatic tricuspid (valve) insufficiency: Secondary | ICD-10-CM | POA: Diagnosis not present

## 2019-05-03 DIAGNOSIS — I69320 Aphasia following cerebral infarction: Secondary | ICD-10-CM | POA: Diagnosis not present

## 2019-05-03 DIAGNOSIS — I110 Hypertensive heart disease with heart failure: Secondary | ICD-10-CM | POA: Diagnosis not present

## 2019-05-03 DIAGNOSIS — I361 Nonrheumatic tricuspid (valve) insufficiency: Secondary | ICD-10-CM | POA: Diagnosis not present

## 2019-05-03 DIAGNOSIS — I482 Chronic atrial fibrillation, unspecified: Secondary | ICD-10-CM | POA: Diagnosis not present

## 2019-05-03 DIAGNOSIS — G459 Transient cerebral ischemic attack, unspecified: Secondary | ICD-10-CM | POA: Diagnosis not present

## 2019-05-03 DIAGNOSIS — I5032 Chronic diastolic (congestive) heart failure: Secondary | ICD-10-CM | POA: Diagnosis not present

## 2019-05-06 DIAGNOSIS — I5032 Chronic diastolic (congestive) heart failure: Secondary | ICD-10-CM | POA: Diagnosis not present

## 2019-05-06 DIAGNOSIS — I361 Nonrheumatic tricuspid (valve) insufficiency: Secondary | ICD-10-CM | POA: Diagnosis not present

## 2019-05-06 DIAGNOSIS — I110 Hypertensive heart disease with heart failure: Secondary | ICD-10-CM | POA: Diagnosis not present

## 2019-05-06 DIAGNOSIS — I69320 Aphasia following cerebral infarction: Secondary | ICD-10-CM | POA: Diagnosis not present

## 2019-05-06 DIAGNOSIS — G459 Transient cerebral ischemic attack, unspecified: Secondary | ICD-10-CM | POA: Diagnosis not present

## 2019-05-06 DIAGNOSIS — I482 Chronic atrial fibrillation, unspecified: Secondary | ICD-10-CM | POA: Diagnosis not present

## 2019-05-08 DIAGNOSIS — G459 Transient cerebral ischemic attack, unspecified: Secondary | ICD-10-CM | POA: Diagnosis not present

## 2019-05-08 DIAGNOSIS — I482 Chronic atrial fibrillation, unspecified: Secondary | ICD-10-CM | POA: Diagnosis not present

## 2019-05-08 DIAGNOSIS — I69320 Aphasia following cerebral infarction: Secondary | ICD-10-CM | POA: Diagnosis not present

## 2019-05-08 DIAGNOSIS — I5032 Chronic diastolic (congestive) heart failure: Secondary | ICD-10-CM | POA: Diagnosis not present

## 2019-05-08 DIAGNOSIS — I110 Hypertensive heart disease with heart failure: Secondary | ICD-10-CM | POA: Diagnosis not present

## 2019-05-08 DIAGNOSIS — I361 Nonrheumatic tricuspid (valve) insufficiency: Secondary | ICD-10-CM | POA: Diagnosis not present

## 2019-05-11 DIAGNOSIS — R279 Unspecified lack of coordination: Secondary | ICD-10-CM | POA: Diagnosis not present

## 2019-05-11 DIAGNOSIS — I110 Hypertensive heart disease with heart failure: Secondary | ICD-10-CM | POA: Diagnosis not present

## 2019-05-11 DIAGNOSIS — Z7902 Long term (current) use of antithrombotics/antiplatelets: Secondary | ICD-10-CM | POA: Diagnosis not present

## 2019-05-11 DIAGNOSIS — I482 Chronic atrial fibrillation, unspecified: Secondary | ICD-10-CM | POA: Diagnosis not present

## 2019-05-11 DIAGNOSIS — M150 Primary generalized (osteo)arthritis: Secondary | ICD-10-CM | POA: Diagnosis not present

## 2019-05-11 DIAGNOSIS — Z8719 Personal history of other diseases of the digestive system: Secondary | ICD-10-CM | POA: Diagnosis not present

## 2019-05-11 DIAGNOSIS — K219 Gastro-esophageal reflux disease without esophagitis: Secondary | ICD-10-CM | POA: Diagnosis not present

## 2019-05-11 DIAGNOSIS — M25532 Pain in left wrist: Secondary | ICD-10-CM | POA: Diagnosis not present

## 2019-05-11 DIAGNOSIS — I361 Nonrheumatic tricuspid (valve) insufficiency: Secondary | ICD-10-CM | POA: Diagnosis not present

## 2019-05-11 DIAGNOSIS — E039 Hypothyroidism, unspecified: Secondary | ICD-10-CM | POA: Diagnosis not present

## 2019-05-11 DIAGNOSIS — I69334 Monoplegia of upper limb following cerebral infarction affecting left non-dominant side: Secondary | ICD-10-CM | POA: Diagnosis not present

## 2019-05-11 DIAGNOSIS — I5032 Chronic diastolic (congestive) heart failure: Secondary | ICD-10-CM | POA: Diagnosis not present

## 2019-05-11 DIAGNOSIS — D692 Other nonthrombocytopenic purpura: Secondary | ICD-10-CM | POA: Diagnosis not present

## 2019-05-13 DIAGNOSIS — I5032 Chronic diastolic (congestive) heart failure: Secondary | ICD-10-CM | POA: Diagnosis not present

## 2019-05-13 DIAGNOSIS — I482 Chronic atrial fibrillation, unspecified: Secondary | ICD-10-CM | POA: Diagnosis not present

## 2019-05-13 DIAGNOSIS — M25532 Pain in left wrist: Secondary | ICD-10-CM | POA: Diagnosis not present

## 2019-05-13 DIAGNOSIS — I110 Hypertensive heart disease with heart failure: Secondary | ICD-10-CM | POA: Diagnosis not present

## 2019-05-13 DIAGNOSIS — I69334 Monoplegia of upper limb following cerebral infarction affecting left non-dominant side: Secondary | ICD-10-CM | POA: Diagnosis not present

## 2019-05-13 DIAGNOSIS — I361 Nonrheumatic tricuspid (valve) insufficiency: Secondary | ICD-10-CM | POA: Diagnosis not present

## 2019-05-14 DIAGNOSIS — I6990 Unspecified sequelae of unspecified cerebrovascular disease: Secondary | ICD-10-CM | POA: Diagnosis not present

## 2019-05-14 DIAGNOSIS — E876 Hypokalemia: Secondary | ICD-10-CM | POA: Diagnosis not present

## 2019-05-14 DIAGNOSIS — I4891 Unspecified atrial fibrillation: Secondary | ICD-10-CM | POA: Diagnosis not present

## 2019-05-14 DIAGNOSIS — I10 Essential (primary) hypertension: Secondary | ICD-10-CM | POA: Diagnosis not present

## 2019-05-16 DIAGNOSIS — I69334 Monoplegia of upper limb following cerebral infarction affecting left non-dominant side: Secondary | ICD-10-CM | POA: Diagnosis not present

## 2019-05-16 DIAGNOSIS — I361 Nonrheumatic tricuspid (valve) insufficiency: Secondary | ICD-10-CM | POA: Diagnosis not present

## 2019-05-16 DIAGNOSIS — M25532 Pain in left wrist: Secondary | ICD-10-CM | POA: Diagnosis not present

## 2019-05-16 DIAGNOSIS — I110 Hypertensive heart disease with heart failure: Secondary | ICD-10-CM | POA: Diagnosis not present

## 2019-05-16 DIAGNOSIS — I482 Chronic atrial fibrillation, unspecified: Secondary | ICD-10-CM | POA: Diagnosis not present

## 2019-05-16 DIAGNOSIS — I5032 Chronic diastolic (congestive) heart failure: Secondary | ICD-10-CM | POA: Diagnosis not present

## 2019-05-17 ENCOUNTER — Other Ambulatory Visit: Payer: Self-pay | Admitting: *Deleted

## 2019-05-17 NOTE — Patient Outreach (Signed)
[facility]) Care Management  [facility] Care Manager  05/17/2019   Jean Ruiz [birth year] [id number]  Telephone assessment  Referral received : 03/13/19 Referral source : [facility] UM, post acute care coordinator Referral reason : [facility] from [facility], on 4/7, Hx; Diastolic heart failure , atrial fib, R/o CVA, possible TIA .  Review of KPN for [place] that includes but not limited to: Hypertension, hyperlipidemia, atrial fib, diastolic heart               Subjective:   Successful outreach call to patient she reports that she is doing good. She remarks being outside getting her 15 minutes of sunshine .  She discussed PCP office visit on yesterday, that she got a good report on her lab work and everything was doing well. She discussed that he still does not want her to drive, she is hoping at next visit that may change. Patient discussed that her niece and sister and law now provides transportation.   Encounter Medications:  Outpatient Encounter Medications as of 05/17/2019  Medication Sig Note  . amLODipine (NORVASC) 5 MG tablet Take 5 mg by mouth daily.   Marland Kitchen aspirin 325 MG EC tablet Take 325 mg by mouth daily.   Marland Kitchen aspirin EC 81 MG tablet Take 81 mg by mouth daily.   . ciprofloxacin (CIPRO) 500 MG tablet Take 500 mg by mouth 2 (two) times daily.   . cyanocobalamin (,VITAMIN B-12,) 1000 MCG/ML injection INJECT ONE MILLILITER WEEKLY FOR FOUR WEEKS THEN, ONE MILLILITER EVERY MONTH 04/04/2016: Received from: External Pharmacy  . furosemide (LASIX) 40 MG tablet Take 40 mg by mouth daily.    . hydrALAZINE (APRESOLINE) 10 MG tablet Take 10 mg by mouth daily.    . Levothyroxine Sodium (SYNTHROID PO) Take 125 mcg/day by mouth daily.    Marland Kitchen lisinopril (PRINIVIL,ZESTRIL) 40 MG tablet 40 mg daily.  04/04/2016: Received from: External Pharmacy  . LORazepam (ATIVAN) 0.5 MG tablet Take 0.5 mg by mouth daily as needed.  04/04/2016: Received from: External Pharmacy  . metoprolol succinate (TOPROL-XL)  25 MG 24 hr tablet Take 100 mg by mouth 2 (two) times daily.    Marland Kitchen ofloxacin (FLOXIN) 0.3 % OTIC solution 5 drops daily.   . potassium chloride SA (K-DUR,KLOR-CON) 20 MEQ tablet Take 20 mEq by mouth daily.    No facility-administered encounter medications on file as of 05/17/2019.     Functional Status:  In your present state of health, do you have any difficulty performing the following activities: 03/18/2019  Hearing? N  Vision? N  Difficulty concentrating or making decisions? N  Walking or climbing stairs? Y  Comment using a walker now   Dressing or bathing? Y  Comment needs assist, family helps at times   Doing errands, shopping? Y  Comment family friend Diplomatic Services operational officer and eating ? Y  Comment family friends bring food , she is able to warm up , and has assist with breakfast prep  Using the Toilet? N  In the past six months, have you accidently leaked urine? Y  Do you have problems with loss of bowel control? N  Managing your Medications? Y  Managing your Finances? Y  Housekeeping or managing your Housekeeping? Y  Comment family/friend assist   Some recent data might be hidden    Fall/Depression Screening: Fall Risk  03/18/2019 05/15/2018 04/23/2018  Falls in the past year? 1 - Yes  Number falls in past yr: 0 - 1  Injury  with Fall? 0 - No  Risk for fall due to : Impaired balance/gait Impaired balance/gait History of fall(s)   PHQ 2/9 Scores 03/18/2019 04/23/2018  PHQ - 2 Score 1 0    Assessment:   Recent CVA No new symptoms, continued progress with mobility , still using walker and cane. Will benefit from continued reinforcement of education on fall prevention and symptoms recognition. Patient discussed her blood pressure doing well when checked by home therapy, recalls recent reading of 120/70.   Nutrition Improved appetite, tolerating meals, does not add salt to foods,  drinking boost twice daily , monitoring weights at home. Patient reports her weight today is  116, reports not losing weight in the last 3 weeks. Patient now has meals on wheels, the frozen meals that family picks up for her now, anticipates in the future they will be delivered.  Medication Patient reports managing her medication at home, able to fill her own pill organizer.   Grief counseling  Patient still receiving phone call visits every 3 weeks and have been very helpful.   Patient denies any new concerns at this time.   Plan:  Will plan follow up call in the next month.   [name], RN, [place] Management Coordinator  [phone] Mobile [phone]- Toll Free Main Office

## 2019-05-20 DIAGNOSIS — I361 Nonrheumatic tricuspid (valve) insufficiency: Secondary | ICD-10-CM | POA: Diagnosis not present

## 2019-05-20 DIAGNOSIS — I482 Chronic atrial fibrillation, unspecified: Secondary | ICD-10-CM | POA: Diagnosis not present

## 2019-05-20 DIAGNOSIS — I110 Hypertensive heart disease with heart failure: Secondary | ICD-10-CM | POA: Diagnosis not present

## 2019-05-20 DIAGNOSIS — I69334 Monoplegia of upper limb following cerebral infarction affecting left non-dominant side: Secondary | ICD-10-CM | POA: Diagnosis not present

## 2019-05-20 DIAGNOSIS — I5032 Chronic diastolic (congestive) heart failure: Secondary | ICD-10-CM | POA: Diagnosis not present

## 2019-05-20 DIAGNOSIS — M25532 Pain in left wrist: Secondary | ICD-10-CM | POA: Diagnosis not present

## 2019-05-23 DIAGNOSIS — I69334 Monoplegia of upper limb following cerebral infarction affecting left non-dominant side: Secondary | ICD-10-CM | POA: Diagnosis not present

## 2019-05-23 DIAGNOSIS — I482 Chronic atrial fibrillation, unspecified: Secondary | ICD-10-CM | POA: Diagnosis not present

## 2019-05-23 DIAGNOSIS — I361 Nonrheumatic tricuspid (valve) insufficiency: Secondary | ICD-10-CM | POA: Diagnosis not present

## 2019-05-23 DIAGNOSIS — I110 Hypertensive heart disease with heart failure: Secondary | ICD-10-CM | POA: Diagnosis not present

## 2019-05-23 DIAGNOSIS — M25532 Pain in left wrist: Secondary | ICD-10-CM | POA: Diagnosis not present

## 2019-05-23 DIAGNOSIS — I5032 Chronic diastolic (congestive) heart failure: Secondary | ICD-10-CM | POA: Diagnosis not present

## 2019-05-27 DIAGNOSIS — I361 Nonrheumatic tricuspid (valve) insufficiency: Secondary | ICD-10-CM | POA: Diagnosis not present

## 2019-05-27 DIAGNOSIS — M25532 Pain in left wrist: Secondary | ICD-10-CM | POA: Diagnosis not present

## 2019-05-27 DIAGNOSIS — I482 Chronic atrial fibrillation, unspecified: Secondary | ICD-10-CM | POA: Diagnosis not present

## 2019-05-27 DIAGNOSIS — I110 Hypertensive heart disease with heart failure: Secondary | ICD-10-CM | POA: Diagnosis not present

## 2019-05-27 DIAGNOSIS — I5032 Chronic diastolic (congestive) heart failure: Secondary | ICD-10-CM | POA: Diagnosis not present

## 2019-05-27 DIAGNOSIS — I69334 Monoplegia of upper limb following cerebral infarction affecting left non-dominant side: Secondary | ICD-10-CM | POA: Diagnosis not present

## 2019-05-30 DIAGNOSIS — I69334 Monoplegia of upper limb following cerebral infarction affecting left non-dominant side: Secondary | ICD-10-CM | POA: Diagnosis not present

## 2019-05-30 DIAGNOSIS — I5032 Chronic diastolic (congestive) heart failure: Secondary | ICD-10-CM | POA: Diagnosis not present

## 2019-05-30 DIAGNOSIS — M25532 Pain in left wrist: Secondary | ICD-10-CM | POA: Diagnosis not present

## 2019-05-30 DIAGNOSIS — I482 Chronic atrial fibrillation, unspecified: Secondary | ICD-10-CM | POA: Diagnosis not present

## 2019-05-30 DIAGNOSIS — I110 Hypertensive heart disease with heart failure: Secondary | ICD-10-CM | POA: Diagnosis not present

## 2019-05-30 DIAGNOSIS — I361 Nonrheumatic tricuspid (valve) insufficiency: Secondary | ICD-10-CM | POA: Diagnosis not present

## 2019-06-03 DIAGNOSIS — I4891 Unspecified atrial fibrillation: Secondary | ICD-10-CM | POA: Diagnosis not present

## 2019-06-03 DIAGNOSIS — G459 Transient cerebral ischemic attack, unspecified: Secondary | ICD-10-CM | POA: Diagnosis not present

## 2019-06-03 DIAGNOSIS — M810 Age-related osteoporosis without current pathological fracture: Secondary | ICD-10-CM | POA: Diagnosis not present

## 2019-06-03 DIAGNOSIS — I10 Essential (primary) hypertension: Secondary | ICD-10-CM | POA: Diagnosis not present

## 2019-06-04 DIAGNOSIS — I5032 Chronic diastolic (congestive) heart failure: Secondary | ICD-10-CM | POA: Diagnosis not present

## 2019-06-04 DIAGNOSIS — M25532 Pain in left wrist: Secondary | ICD-10-CM | POA: Diagnosis not present

## 2019-06-04 DIAGNOSIS — I361 Nonrheumatic tricuspid (valve) insufficiency: Secondary | ICD-10-CM | POA: Diagnosis not present

## 2019-06-04 DIAGNOSIS — I110 Hypertensive heart disease with heart failure: Secondary | ICD-10-CM | POA: Diagnosis not present

## 2019-06-04 DIAGNOSIS — I482 Chronic atrial fibrillation, unspecified: Secondary | ICD-10-CM | POA: Diagnosis not present

## 2019-06-04 DIAGNOSIS — I69334 Monoplegia of upper limb following cerebral infarction affecting left non-dominant side: Secondary | ICD-10-CM | POA: Diagnosis not present

## 2019-06-06 DIAGNOSIS — I110 Hypertensive heart disease with heart failure: Secondary | ICD-10-CM | POA: Diagnosis not present

## 2019-06-06 DIAGNOSIS — I5032 Chronic diastolic (congestive) heart failure: Secondary | ICD-10-CM | POA: Diagnosis not present

## 2019-06-06 DIAGNOSIS — I482 Chronic atrial fibrillation, unspecified: Secondary | ICD-10-CM | POA: Diagnosis not present

## 2019-06-06 DIAGNOSIS — I69334 Monoplegia of upper limb following cerebral infarction affecting left non-dominant side: Secondary | ICD-10-CM | POA: Diagnosis not present

## 2019-06-06 DIAGNOSIS — I361 Nonrheumatic tricuspid (valve) insufficiency: Secondary | ICD-10-CM | POA: Diagnosis not present

## 2019-06-06 DIAGNOSIS — M25532 Pain in left wrist: Secondary | ICD-10-CM | POA: Diagnosis not present

## 2019-06-10 DIAGNOSIS — Z87448 Personal history of other diseases of urinary system: Secondary | ICD-10-CM | POA: Diagnosis not present

## 2019-06-10 DIAGNOSIS — Z87440 Personal history of urinary (tract) infections: Secondary | ICD-10-CM | POA: Diagnosis not present

## 2019-06-10 DIAGNOSIS — R339 Retention of urine, unspecified: Secondary | ICD-10-CM | POA: Diagnosis not present

## 2019-06-17 ENCOUNTER — Other Ambulatory Visit: Payer: Self-pay | Admitting: *Deleted

## 2019-06-17 ENCOUNTER — Encounter: Payer: Self-pay | Admitting: *Deleted

## 2019-06-17 NOTE — Patient Outreach (Addendum)
[place] [facility]) Care Management  [facility] Care Manager    06/17/2019  Jean Ruiz [birth year] [id number]    Telephone assessment   Referral received : 03/13/19 Referral source : [facility] UM, post acute care coordinator Referral reason : DC from [facility], on 4/7, Hx; Diastolic heart failure , atrial fib, R/o CVA, possible TIA .  Review of KPN for PHX that includes but not limited to: Hypertension, hyperlipidemia, atrial fib, diastolic heart, urethral narrowing.           Subjective:  Patient discussed feeling pretty good on today. She discussed recent visit with PCP in the last 2 weeks and recommended follow up with cardiology reported heart rate a little slower.  Patient discussed her history of atrial and states she can tell if her heart rate is fast.  She reports home health PT last final visit in the last 2 weeks and reports heart rate  and blood pressure was okay. Patient states that she has a blood pressure machine and can monitor at home but hasn't due to [facility] visits but she can begin to. Discussed normal ranges of heart rate, patient states that she is aware.  Patient discussed she plans to contact cardiology office again today, reports attempting call in the last week,  offered assistance she states that she will call office, reinforced importance of following up she verbalized understanding.  Review of recent diagnosis of TIA, patient denies any new symptoms and is able to identify symptoms to notify MD.  Patient reports tolerating activity in home using a cane, goes outside at least once daily, to enjoy the sunshine for at least 15 minutes.  Patient keeps walker at bedside for use at night, denies having falls.  Family and friends provides transportation , patient does not drive and understands safety reasons for this after TIA.   Patient discussed managing her daily medications filling a weekly pill organizer and taking medications daily.   Patient voiced being  pleased with improved appetite and states that she has gained 3 pounds, weight up to 123 lbs. Patient receiving meals on wheels , family friends picks up meals for her weekly in [place]  as they are not delivered in her town yet.  Patient discussed weighing daily now and denies symptoms of sudden weight gain, shortness of breath or swelling.   Patient with recent follow up urologist for history of urethral narrowing and had urethral dilation done, states that this something that she has done at least once a year and missed her last appointment . She denies signs symptoms of UTI at this time when reviewed.   Patient discussed continuing every 3rd Monday, telephone calls with [facility] for grief counseling following her spouse death on this year, states outreach is very supportive.   Objective: Ht 1.6 m (5\' 3" )   Wt 123 lb (55.8 kg)   BMI 21.79 kg/m   Encounter Medications:  Outpatient Encounter Medications as of 06/17/2019  Medication Sig Note  . amLODipine (NORVASC) 5 MG tablet Take 5 mg by mouth daily.   Marland Kitchen aspirin 325 MG EC tablet Take 325 mg by mouth daily.   Marland Kitchen aspirin EC 81 MG tablet Take 81 mg by mouth daily.   . ciprofloxacin (CIPRO) 500 MG tablet Take 500 mg by mouth 2 (two) times daily.   . cyanocobalamin (,VITAMIN B-12,) 1000 MCG/ML injection INJECT ONE MILLILITER WEEKLY FOR FOUR WEEKS THEN, ONE MILLILITER EVERY MONTH 04/04/2016: Received from: External Pharmacy  . furosemide (LASIX) 40  MG tablet Take 40 mg by mouth daily.    . hydrALAZINE (APRESOLINE) 10 MG tablet Take 10 mg by mouth daily.    . Levothyroxine Sodium (SYNTHROID PO) Take 125 mcg/day by mouth daily.    Marland Kitchen lisinopril (PRINIVIL,ZESTRIL) 40 MG tablet 40 mg daily.  04/04/2016: Received from: External Pharmacy  . LORazepam (ATIVAN) 0.5 MG tablet Take 0.5 mg by mouth daily as needed.  04/04/2016: Received from: External Pharmacy  . metoprolol succinate (TOPROL-XL) 25 MG 24 hr tablet Take 100 mg by mouth 2 (two) times daily.     Marland Kitchen ofloxacin (FLOXIN) 0.3 % OTIC solution 5 drops daily.   . potassium chloride SA (K-DUR,KLOR-CON) 20 MEQ tablet Take 20 mEq by mouth daily.    No facility-administered encounter medications on file as of 06/17/2019.     Functional Status:  In your present state of health, do you have any difficulty performing the following activities: 03/18/2019  Hearing? N  Vision? N  Difficulty concentrating or making decisions? N  Walking or climbing stairs? Y  Comment using a walker now   Dressing or bathing? Y  Comment needs assist, family helps at times   Doing errands, shopping? Y  Comment family friend Diplomatic Services operational officer and eating ? Y  Comment family friends bring food , she is able to warm up , and has assist with breakfast prep  Using the Toilet? N  In the past six months, have you accidently leaked urine? Y  Do you have problems with loss of bowel control? N  Managing your Medications? Y  Managing your Finances? Y  Housekeeping or managing your Housekeeping? Y  Comment family/friend assist   Some recent data might be hidden    Fall/Depression Screening: Fall Risk  03/18/2019 05/15/2018 04/23/2018  Falls in the past year? 1 - Yes  Number falls in past yr: 0 - 1  Injury with Fall? 0 - No  Risk for fall due to : Impaired balance/gait Impaired balance/gait History of fall(s)   PHQ 2/9 Scores 03/18/2019 04/23/2018  PHQ - 2 Score 1 0    Assessment:   Patient has progressing well after recent hospitalization after TIA. No new symptoms, she understands when to notify MD or seek medical help.  Patient has completed home health therapy and adhering to safety precautions.  Patient has consistent follow up with medical team, and she has home support in place. No ongoing care management needs identified.   She will benefit from ongoing telephonic  Education  support for chronic medical conditions Atrial fib.  Explained next step for ongoing support of  health coach telephone  program  patient is agreeable.   Plan:  Will plan transition to health coach for continued education of chronic conditions that includes  atrial fib, diastolic heart failure.  Will send PCP discipline closure letter.   [name], RN, [place] Management Coordinator  [phone]- Mobile [phone]- Toll Free Main Office

## 2019-06-18 ENCOUNTER — Other Ambulatory Visit: Payer: Self-pay | Admitting: *Deleted

## 2019-07-04 ENCOUNTER — Other Ambulatory Visit: Payer: Self-pay | Admitting: *Deleted

## 2019-07-04 NOTE — Patient Outreach (Signed)
[facility] Ru[name]) Care Management  07/04/2019  Jean Ruiz 24-Apr-1935 [id number]   RN Health Coach Initial Assessment  Referral Date:  06/17/2019 Referral Source:  Transfer from [place] Reason for Referral:  Continued Disease Management Education Insurance:  Medicare   Outreach Attempt:  Outreach attempt #1 to patient for introduction and initial telephone assessment. No answer and unable to leave voicemail message due to voicemail not engaging.  Plan:  RN Health Coach will make another outreach attempt within the month of August.   Jean Pettie RN [place] [phone] [email or web address]

## 2019-07-08 DIAGNOSIS — E039 Hypothyroidism, unspecified: Secondary | ICD-10-CM | POA: Diagnosis not present

## 2019-07-15 DIAGNOSIS — E890 Postprocedural hypothyroidism: Secondary | ICD-10-CM | POA: Diagnosis not present

## 2019-07-15 DIAGNOSIS — Z681 Body mass index (BMI) 19 or less, adult: Secondary | ICD-10-CM | POA: Diagnosis not present

## 2019-07-15 DIAGNOSIS — Z85850 Personal history of malignant neoplasm of thyroid: Secondary | ICD-10-CM | POA: Diagnosis not present

## 2019-07-15 DIAGNOSIS — E039 Hypothyroidism, unspecified: Secondary | ICD-10-CM | POA: Diagnosis not present

## 2019-07-15 DIAGNOSIS — Z20828 Contact with and (suspected) exposure to other viral communicable diseases: Secondary | ICD-10-CM | POA: Diagnosis not present

## 2019-07-15 DIAGNOSIS — U071 COVID-19: Secondary | ICD-10-CM | POA: Diagnosis not present

## 2019-07-17 ENCOUNTER — Other Ambulatory Visit: Payer: Self-pay | Admitting: *Deleted

## 2019-07-17 NOTE — Patient Outreach (Signed)
[place] [facility]) Care Management  07/17/2019  TARAJI MUNGO 15-Nov-1935 [id number]   RN Health Coach Initial Assessment  Referral Date:  06/17/2019 Referral Source:  Transfer from [place] Reason for Referral:  Continued Disease Management Education Insurance:  Medicare   Outreach Attempt:  Outreach attempt #2 to patient for introduction and initial telephone assessment. No answer and unable to leave voicemail message due to voicemail not engaging.  Plan:  RN Health Coach will send patient Unsuccessful Outreach Letter.  RN Health Coach will make another outreach within the month of September  [place] [phone] [email or web address]

## 2019-07-20 DIAGNOSIS — E039 Hypothyroidism, unspecified: Secondary | ICD-10-CM | POA: Diagnosis not present

## 2019-07-20 DIAGNOSIS — R5381 Other malaise: Secondary | ICD-10-CM | POA: Diagnosis not present

## 2019-07-20 DIAGNOSIS — R358 Other polyuria: Secondary | ICD-10-CM | POA: Diagnosis not present

## 2019-07-20 DIAGNOSIS — I4891 Unspecified atrial fibrillation: Secondary | ICD-10-CM | POA: Diagnosis not present

## 2019-07-20 DIAGNOSIS — R32 Unspecified urinary incontinence: Secondary | ICD-10-CM | POA: Diagnosis not present

## 2019-07-20 DIAGNOSIS — M1990 Unspecified osteoarthritis, unspecified site: Secondary | ICD-10-CM | POA: Diagnosis not present

## 2019-07-20 DIAGNOSIS — Z79899 Other long term (current) drug therapy: Secondary | ICD-10-CM | POA: Diagnosis not present

## 2019-07-20 DIAGNOSIS — Z7902 Long term (current) use of antithrombotics/antiplatelets: Secondary | ICD-10-CM | POA: Diagnosis not present

## 2019-07-20 DIAGNOSIS — I509 Heart failure, unspecified: Secondary | ICD-10-CM | POA: Diagnosis not present

## 2019-07-20 DIAGNOSIS — Z7982 Long term (current) use of aspirin: Secondary | ICD-10-CM | POA: Diagnosis not present

## 2019-08-15 DIAGNOSIS — N390 Urinary tract infection, site not specified: Secondary | ICD-10-CM | POA: Diagnosis not present

## 2019-08-15 DIAGNOSIS — E890 Postprocedural hypothyroidism: Secondary | ICD-10-CM | POA: Diagnosis not present

## 2019-08-15 DIAGNOSIS — E785 Hyperlipidemia, unspecified: Secondary | ICD-10-CM | POA: Diagnosis not present

## 2019-08-15 DIAGNOSIS — I10 Essential (primary) hypertension: Secondary | ICD-10-CM | POA: Diagnosis not present

## 2019-08-15 DIAGNOSIS — R7301 Impaired fasting glucose: Secondary | ICD-10-CM | POA: Diagnosis not present

## 2019-08-15 DIAGNOSIS — I4891 Unspecified atrial fibrillation: Secondary | ICD-10-CM | POA: Diagnosis not present

## 2019-08-15 DIAGNOSIS — D519 Vitamin B12 deficiency anemia, unspecified: Secondary | ICD-10-CM | POA: Diagnosis not present

## 2019-08-15 DIAGNOSIS — I6990 Unspecified sequelae of unspecified cerebrovascular disease: Secondary | ICD-10-CM | POA: Diagnosis not present

## 2019-08-21 ENCOUNTER — Other Ambulatory Visit: Payer: Self-pay | Admitting: *Deleted

## 2019-08-21 NOTE — Patient Outreach (Signed)
Jean Ruiz [facility]) Care Management  08/21/2019  Jean Ruiz Feb 11, 1935 [id number]    Jean Ruiz Initial Assessment  Referral Date:06/17/2019 Referral Source:Transfer from [place] Reason for Referral:Continued Disease Management Education Insurance:Medicare   Outreach Attempt:  Outreach attempt #3 to patient for introduction and initial telephone assessment. No answer and unable to leave voicemail message due to voicemail not engaging.  Plan: Outreach attempts has been made. If no response to calls and letter in ten business days [place] will proceed with case closure. Will send follow up information to initial Jean Ruiz.    Jean Arms RN, BSN, [place] Direct Dial:  [phone]  Fax: [phone]

## 2019-09-02 ENCOUNTER — Encounter: Payer: Self-pay | Admitting: *Deleted

## 2019-09-02 ENCOUNTER — Other Ambulatory Visit: Payer: Self-pay | Admitting: *Deleted

## 2019-09-02 NOTE — Patient Outreach (Signed)
[place] [facility]) Care Management  [place]  09/02/2019   Jean Ruiz 1935/01/10 [id number]   [place] Initial Assessment   Referral Date:  06/17/2019 Referral Source:  Transfer from [place] Reason for Referral:  Continued Disease Management Education Insurance:  Medicare   Outreach Attempt:  Successful telephone outreach to patient for initial telephone outreach and introduction.  HIPAA verified with patient.  RN Health Coach introduced self and role.  Patient verbally agrees to Disease Management outreaches.  Patient completed initial telephone assessment.  Social:  Patient lives at home alone.  Ambulates mostly with cane and uses walker at times.  Denies any falls in the past year.  Reports being independent with ADLs and needing assistance with IADLs with someone helping with cleaning.  Brother and other family assist with driving patient to medical appointments.  DME in the home include:  Straight cane, rolling walker, shower chair with back, blood pressure cuff, eyeglasses, grab bars in shower and around toilet, and scale.  Conditions:  Per chart review and discussion with patient, PMH include but not limited to:  Hypertension, thyroid cancer with thyroidectomy leading to hypothyroidism, urethral stenosis, chronic urinary tract infections, diastolic heart failure, atrial fibrillation, transient ischemic attack, osteoporosis, and bilateral cataract removal.   Denies any recent emergency room visits or hospitalizations.  Does report her husband suddenly dying in March, then the loss of her sister a week later and then the loss of her dog.  Continues with monthly grief counseling telehealth visits with [facility].  Patient reports since her last hospitalization and stay in the rehabilitation center she had lost weight and is now starting to pick some of her weight back up with drinking Boost twice a day.  Reports weighing herself every  other day.  Discussed the importance of daily weight monitoring.  States she monitors blood pressures about monthly and states her readings have been fine lately.  Medications:  Reports taking about 8 medications daily.  Manages medications herself with weekly pill box fills.  Denies any issues with affording medications.   Encounter Medications:  Outpatient Encounter Medications as of 09/02/2019  Medication Sig Note  . amLODipine (NORVASC) 5 MG tablet Take 5 mg by mouth daily.   Marland Kitchen aspirin EC 81 MG tablet Take 81 mg by mouth daily.   . clopidogrel (PLAVIX) 75 MG tablet Take 75 mg by mouth daily.   . cyanocobalamin (,VITAMIN B-12,) 1000 MCG/ML injection INJECT ONE MILLILITER WEEKLY FOR FOUR WEEKS THEN, ONE MILLILITER EVERY MONTH 04/04/2016: Received from: External Pharmacy  . furosemide (LASIX) 40 MG tablet Take 40 mg by mouth daily.    . hydrALAZINE (APRESOLINE) 10 MG tablet Take 10 mg by mouth daily.    . Levothyroxine Sodium (SYNTHROID PO) Take 125 mcg/day by mouth daily.    Marland Kitchen lisinopril (PRINIVIL,ZESTRIL) 40 MG tablet 40 mg daily.  04/04/2016: Received from: External Pharmacy  . LORazepam (ATIVAN) 0.5 MG tablet Take 0.5 mg by mouth daily as needed.  04/04/2016: Received from: External Pharmacy  . metoprolol succinate (TOPROL-XL) 25 MG 24 hr tablet Take 100 mg by mouth 2 (two) times daily.    . potassium chloride SA (K-DUR,KLOR-CON) 20 MEQ tablet Take 20 mEq by mouth daily. 09/02/2019: Reports taking 1 tablet three times a day  . aspirin 325 MG EC tablet Take 325 mg by mouth daily. 09/02/2019: Reports not taking  . ciprofloxacin (CIPRO) 500 MG tablet Take 500 mg by mouth 2 (two) times daily.  09/02/2019: Reports not taking  . ofloxacin (FLOXIN) 0.3 % OTIC solution 5 drops daily. 09/02/2019: Reports not taking   No facility-administered encounter medications on file as of 09/02/2019.     Functional Status:  In your present state of health, do you have any difficulty performing the following  activities: 09/02/2019 03/18/2019  Hearing? N N  Vision? N N  Difficulty concentrating or making decisions? N N  Walking or climbing stairs? N Y  Comment - using a walker now   Dressing or bathing? N Y  Comment - needs assist, family helps at times   Doing errands, shopping? N Y  Comment - family friend Diplomatic Services operational officer and eating ? N Y  Comment - family friends bring food , she is able to warm up , and has assist with breakfast prep  Using the Toilet? N N  In the past six months, have you accidently leaked urine? N Y  Do you have problems with loss of bowel control? N N  Managing your Medications? N Y  Managing your Finances? N Y  Housekeeping or managing your Housekeeping? Tempie Donning  Comment has someone assist with cleaning family/friend assist   Some recent data might be hidden    Fall/Depression Screening: Fall Risk  09/02/2019 03/18/2019 05/15/2018  Falls in the past year? 0 1 -  Number falls in past yr: - 0 -  Injury with Fall? - 0 -  Risk for fall due to : History of fall(s);Medication side effect;Impaired mobility;Impaired balance/gait Impaired balance/gait Impaired balance/gait  Follow up Falls evaluation completed;Education provided;Falls prevention discussed - -   PHQ 2/9 Scores 09/02/2019 03/18/2019 04/23/2018  PHQ - 2 Score 0 1 0   THN CM Care Plan Problem One     Most Recent Value  Care Plan Problem One  Knowledge deficiet related to diagnosis of heart failure.  Role Documenting the Problem One  [place] for Problem One  Active  [facility] Long Term Goal   Patient will report weighing herself daily within the next 90 days.  THN Long Term Goal Start Date  09/02/19  Interventions for Problem One Long Term Goal  Care plan and goals reviewed and discussed with patient, reviewed medications and indications and encouraged medication compliance, reviewed signs and symptoms of heart failure, sending Living Better with heart failure educational packet, discussed the  importance of daily weight monitoring with patient, encouraged to keep and attend scheduled medical appointments, fall precautions and preventions reviewed and discussed, confirmed patient continues with grief counseling,      Appointments:  Attended appointment with primary care provider, Dr. Delena Bali on 08/15/2019 and has follow up appointment on 09/16/2019.  Advanced Directives:  Reports having Living Will and [place] in place and does not wish to make any changes at this time.   Consent:  [facility] services reviewed and discussed.  Patient verbally agrees to Disease Management outreaches.  Plan: RN Health Coach will send primary MD barriers letter. RN Health Coach will route initial telephone assessment note to primary MD. [place] will send patient Living Better with Heart Failure Educational Packet. RN Health Coach will make next telephone outreach to patient within the month of November.   [place] [phone] [email or web address] [email or web address]

## 2019-09-16 DIAGNOSIS — D519 Vitamin B12 deficiency anemia, unspecified: Secondary | ICD-10-CM | POA: Diagnosis not present

## 2019-09-16 DIAGNOSIS — Z23 Encounter for immunization: Secondary | ICD-10-CM | POA: Diagnosis not present

## 2019-09-16 DIAGNOSIS — E039 Hypothyroidism, unspecified: Secondary | ICD-10-CM | POA: Diagnosis not present

## 2019-09-19 DIAGNOSIS — Z85850 Personal history of malignant neoplasm of thyroid: Secondary | ICD-10-CM | POA: Diagnosis not present

## 2019-09-19 DIAGNOSIS — E890 Postprocedural hypothyroidism: Secondary | ICD-10-CM | POA: Diagnosis not present

## 2019-09-19 DIAGNOSIS — E039 Hypothyroidism, unspecified: Secondary | ICD-10-CM | POA: Diagnosis not present

## 2019-09-19 DIAGNOSIS — Z6820 Body mass index (BMI) 20.0-20.9, adult: Secondary | ICD-10-CM | POA: Diagnosis not present

## 2019-09-19 DIAGNOSIS — I10 Essential (primary) hypertension: Secondary | ICD-10-CM | POA: Diagnosis not present

## 2019-09-19 DIAGNOSIS — M810 Age-related osteoporosis without current pathological fracture: Secondary | ICD-10-CM | POA: Diagnosis not present

## 2019-09-25 DIAGNOSIS — M7062 Trochanteric bursitis, left hip: Secondary | ICD-10-CM | POA: Diagnosis not present

## 2019-10-07 ENCOUNTER — Other Ambulatory Visit: Payer: Self-pay | Admitting: *Deleted

## 2019-10-07 ENCOUNTER — Encounter: Payer: Self-pay | Admitting: *Deleted

## 2019-10-07 NOTE — Patient Outreach (Signed)
[place] [facility]) Care Management  10/07/2019  JANEEKA PUHL 1935/08/22 [id number]   [place] Monthly Outreach  Referral Date:06/17/2019 Referral Source:Transfer from [place] Reason for Referral:Continued Disease Management Education Insurance:Medicare   Outreach Attempt:  Successful telephone outreach to patient for follow up.  HIPAA verified with patient.  Patient stating she is just returning home from the beach today.  Reports while at the beach she limited crowd exposure and did not go out to eat.  Patient does also report she is experiencing a bursitis flare in her left buttocks and visiting an Urgent Care.  States she has completed a steroid taper and continues to take muscle relaxants for pain.  Denies any recent falls and states she has been using her cane and walker a lot more with the bursitis flare.  Fall precautions and preventions reviewed and discussed.  Does report she has been weighing herself daily with weight typically ranging 125 pounds.  Has not weighed in the last week she was at the beach.  Denies any shortness of breath or lower extremity edema.  Continues with weekly grief counseling.  Appointments:  Last attended appointment with primary care provider, Dr. Delena Bali on 08/16/2019 and needs to schedule follow up.  Encouraged to schedule follow up.  Plan: RN Health Coach will make next telephone outreach to patient within the month of January.  [place] [phone] [email or web address]

## 2019-10-18 DIAGNOSIS — D519 Vitamin B12 deficiency anemia, unspecified: Secondary | ICD-10-CM | POA: Diagnosis not present

## 2019-10-21 DIAGNOSIS — Z0000 Encounter for general adult medical examination without abnormal findings: Secondary | ICD-10-CM | POA: Diagnosis not present

## 2019-10-21 DIAGNOSIS — E785 Hyperlipidemia, unspecified: Secondary | ICD-10-CM | POA: Diagnosis not present

## 2019-10-21 DIAGNOSIS — Z1231 Encounter for screening mammogram for malignant neoplasm of breast: Secondary | ICD-10-CM | POA: Diagnosis not present

## 2019-10-21 DIAGNOSIS — Z1331 Encounter for screening for depression: Secondary | ICD-10-CM | POA: Diagnosis not present

## 2019-10-21 DIAGNOSIS — N959 Unspecified menopausal and perimenopausal disorder: Secondary | ICD-10-CM | POA: Diagnosis not present

## 2019-10-21 DIAGNOSIS — Z136 Encounter for screening for cardiovascular disorders: Secondary | ICD-10-CM | POA: Diagnosis not present

## 2019-10-21 DIAGNOSIS — Z9181 History of falling: Secondary | ICD-10-CM | POA: Diagnosis not present

## 2019-10-25 DIAGNOSIS — M8008XA Age-related osteoporosis with current pathological fracture, vertebra(e), initial encounter for fracture: Secondary | ICD-10-CM | POA: Diagnosis not present

## 2019-10-25 DIAGNOSIS — M8008XS Age-related osteoporosis with current pathological fracture, vertebra(e), sequela: Secondary | ICD-10-CM | POA: Diagnosis not present

## 2019-10-25 DIAGNOSIS — Z6824 Body mass index (BMI) 24.0-24.9, adult: Secondary | ICD-10-CM | POA: Diagnosis not present

## 2019-10-25 DIAGNOSIS — Z4689 Encounter for fitting and adjustment of other specified devices: Secondary | ICD-10-CM | POA: Diagnosis not present

## 2019-10-28 DIAGNOSIS — S32030A Wedge compression fracture of third lumbar vertebra, initial encounter for closed fracture: Secondary | ICD-10-CM | POA: Diagnosis not present

## 2019-10-28 DIAGNOSIS — M810 Age-related osteoporosis without current pathological fracture: Secondary | ICD-10-CM | POA: Diagnosis not present

## 2019-10-28 DIAGNOSIS — N959 Unspecified menopausal and perimenopausal disorder: Secondary | ICD-10-CM | POA: Diagnosis not present

## 2019-10-28 DIAGNOSIS — M8008XA Age-related osteoporosis with current pathological fracture, vertebra(e), initial encounter for fracture: Secondary | ICD-10-CM | POA: Diagnosis not present

## 2019-11-04 DIAGNOSIS — M8008XA Age-related osteoporosis with current pathological fracture, vertebra(e), initial encounter for fracture: Secondary | ICD-10-CM | POA: Diagnosis not present

## 2019-11-06 DIAGNOSIS — S32030A Wedge compression fracture of third lumbar vertebra, initial encounter for closed fracture: Secondary | ICD-10-CM | POA: Diagnosis not present

## 2019-11-06 DIAGNOSIS — I4891 Unspecified atrial fibrillation: Secondary | ICD-10-CM | POA: Diagnosis not present

## 2019-11-06 DIAGNOSIS — I6990 Unspecified sequelae of unspecified cerebrovascular disease: Secondary | ICD-10-CM | POA: Diagnosis not present

## 2019-11-08 DIAGNOSIS — M8008XA Age-related osteoporosis with current pathological fracture, vertebra(e), initial encounter for fracture: Secondary | ICD-10-CM | POA: Diagnosis not present

## 2019-12-10 ENCOUNTER — Other Ambulatory Visit: Payer: Self-pay | Admitting: *Deleted

## 2019-12-10 NOTE — Patient Outreach (Signed)
[facility]) Care Management  12/10/2019  DANNE HEBB Mar 23, 1935 [id number]   RN Health Coach Quarterly Outreach  Referral Date:06/17/2019 Referral Source:Transfer from [place] Reason for Referral:Continued Disease Management Education Insurance:Medicare   Outreach Attempt:  Outreach attempt #1 to patient for follow up.  Patient answered and stated she was expecting company any minute and requested telephone call back at later date and time.   Plan:  RN Health Coach will make another outreach attempt within the month of January per patient's request.  Hubert Azure RN [place] [phone] [email or web address]

## 2019-12-16 DIAGNOSIS — E039 Hypothyroidism, unspecified: Secondary | ICD-10-CM | POA: Diagnosis not present

## 2019-12-23 DIAGNOSIS — M810 Age-related osteoporosis without current pathological fracture: Secondary | ICD-10-CM | POA: Diagnosis not present

## 2019-12-23 DIAGNOSIS — Z6821 Body mass index (BMI) 21.0-21.9, adult: Secondary | ICD-10-CM | POA: Diagnosis not present

## 2019-12-23 DIAGNOSIS — I10 Essential (primary) hypertension: Secondary | ICD-10-CM | POA: Diagnosis not present

## 2019-12-23 DIAGNOSIS — E890 Postprocedural hypothyroidism: Secondary | ICD-10-CM | POA: Diagnosis not present

## 2019-12-23 DIAGNOSIS — E039 Hypothyroidism, unspecified: Secondary | ICD-10-CM | POA: Diagnosis not present

## 2019-12-23 DIAGNOSIS — Z85850 Personal history of malignant neoplasm of thyroid: Secondary | ICD-10-CM | POA: Diagnosis not present

## 2019-12-30 ENCOUNTER — Other Ambulatory Visit: Payer: Self-pay | Admitting: *Deleted

## 2019-12-30 ENCOUNTER — Encounter: Payer: Self-pay | Admitting: *Deleted

## 2019-12-30 NOTE — Patient Outreach (Signed)
[place] [facility]) Care Management  [place]  12/30/2019   KISHIA SHACKETT August 19, 1935 [id number]   [place] Monthly Outreach   Referral Date:  06/17/2019 Referral Source:  Transfer from [place] Reason for Referral:  Continued Disease Management Education Insurance:  Medicare   Outreach Attempt:  Successful telephone outreach to patient for follow up.  HIPAA verified with patient.  Patient reporting she is returning from a weekend trip at the beach with her brother.  States she has been doing well and has obtained her first COVID vaccine and is scheduled for her second.  Denies any signs and symptoms of reaction other than arm soreness.  States she weighs a few times a week.  Last weight was 128 pounds and patient is hoping to gain up to her previous baseline of 135-138 pounds.  Discussed importance of daily weight monitoring with patient even in her hopes of gaining weight.  Denies any shortness of breath or lower extremity edema at this time.  States she has completed her counseling services for grief over the loss of her husband and reports she is doing fine but nights and weekends are harder.  Patient is anxious to get back to being sociable with friends and family but understands the importance of limiting contact due to the Pandemic.  Encounter Medications:  Outpatient Encounter Medications as of 12/30/2019  Medication Sig Note  . amLODipine (NORVASC) 5 MG tablet Take 5 mg by mouth daily.   Marland Kitchen aspirin EC 81 MG tablet Take 81 mg by mouth daily.   . clopidogrel (PLAVIX) 75 MG tablet Take 75 mg by mouth daily.   . cyanocobalamin (,VITAMIN B-12,) 1000 MCG/ML injection INJECT ONE MILLILITER WEEKLY FOR FOUR WEEKS THEN, ONE MILLILITER EVERY MONTH 04/04/2016: Received from: External Pharmacy  . furosemide (LASIX) 40 MG tablet Take 40 mg by mouth daily.    . hydrALAZINE (APRESOLINE) 10 MG tablet Take 10 mg by mouth daily.    . Levothyroxine Sodium  (SYNTHROID PO) Take 125 mcg/day by mouth daily.  12/30/2019: Reports dose decreased to 100 mcg daily  . lisinopril (PRINIVIL,ZESTRIL) 40 MG tablet 40 mg daily.  04/04/2016: Received from: External Pharmacy  . LORazepam (ATIVAN) 0.5 MG tablet Take 0.5 mg by mouth daily as needed.  04/04/2016: Received from: External Pharmacy  . metoprolol succinate (TOPROL-XL) 25 MG 24 hr tablet Take 100 mg by mouth 2 (two) times daily.    . potassium chloride SA (K-DUR,KLOR-CON) 20 MEQ tablet Take 20 mEq by mouth daily. 09/02/2019: Reports taking 1 tablet three times a day  . aspirin 325 MG EC tablet Take 325 mg by mouth daily. 09/02/2019: Reports not taking  . ciprofloxacin (CIPRO) 500 MG tablet Take 500 mg by mouth 2 (two) times daily. 09/02/2019: Reports not taking  . ofloxacin (FLOXIN) 0.3 % OTIC solution 5 drops daily. 09/02/2019: Reports not taking   No facility-administered encounter medications on file as of 12/30/2019.    Functional Status:  In your present state of health, do you have any difficulty performing the following activities: 09/02/2019 03/18/2019  Hearing? N N  Vision? N N  Difficulty concentrating or making decisions? N N  Walking or climbing stairs? N Y  Comment - using a walker now   Dressing or bathing? N Y  Comment - needs assist, family helps at times   Doing errands, shopping? N Y  Comment - family friend Diplomatic Services operational officer and eating ? N Y  Comment -  family friends bring food , she is able to warm up , and has assist with breakfast prep  Using the Toilet? N N  In the past six months, have you accidently leaked urine? N Y  Do you have problems with loss of bowel control? N N  Managing your Medications? N Y  Managing your Finances? N Y  Housekeeping or managing your Housekeeping? Tempie Donning  Comment has someone assist with cleaning family/friend assist   Some recent data might be hidden    Fall/Depression Screening: Fall Risk  12/30/2019 10/07/2019 09/02/2019  Falls in the past year? 0  0 0  Number falls in past yr: 0 0 -  Injury with Fall? 0 0 -  Risk for fall due to : Medication side effect;Impaired balance/gait;Impaired mobility Medication side effect;Impaired balance/gait;Impaired mobility History of fall(s);Medication side effect;Impaired mobility;Impaired balance/gait  Follow up Falls evaluation completed;Education provided;Falls prevention discussed Falls evaluation completed;Education provided;Falls prevention discussed Falls evaluation completed;Education provided;Falls prevention discussed   PHQ 2/9 Scores 09/02/2019 03/18/2019 04/23/2018  PHQ - 2 Score 0 1 0   THN CM Care Plan Problem One     Most Recent Value  Care Plan Problem One  Knowledge deficiet related to diagnosis of heart failure.  Role Documenting the Problem One  [place] for Problem One  Active  [facility] Long Term Goal   Patient will report no emergency room visits or hospitalizations within the next 90days.  THN Long Term Goal Start Date  12/30/19  THN Long Term Goal Met Date  12/30/19  Interventions for Problem One Long Term Goal  Reviewed and discussed care plan and goals, discussed importance of daily weight monitoring and encouraged patient to do so, discussed importance of monitoring increase in weight versus increase in water weight gain (as patient is trying to gain weight back to her baseline), reviewed medications and encouraged medication compliance, fall precautions and preventions reviewed and discussed, encouraged patient to use cane and walker with ambulation, encouraged to increase acitivity as tolerated, encouraged to keep and attend scheduled medical appointments, reviewed and discussed heart failure zones and action plan and encouraged patient to continue to review heart failure education     Appointments: Patient reports last attending appointment at primary care on 11/06/2019.  Has follow up appointment with Endocrinologist, Dr. Garnet Koyanagi in February 2021.  Plan: RN Health  Coach will send primary care provider quarterly update. RN Health Coach will make next telephone outreach to patient within the month of April and patient agrees to future outreach.  [place] [phone] Jumana [email or web address]

## 2020-01-03 DIAGNOSIS — D519 Vitamin B12 deficiency anemia, unspecified: Secondary | ICD-10-CM | POA: Diagnosis not present

## 2020-02-03 DIAGNOSIS — D519 Vitamin B12 deficiency anemia, unspecified: Secondary | ICD-10-CM | POA: Diagnosis not present

## 2020-02-13 DIAGNOSIS — E890 Postprocedural hypothyroidism: Secondary | ICD-10-CM | POA: Diagnosis not present

## 2020-02-13 DIAGNOSIS — E785 Hyperlipidemia, unspecified: Secondary | ICD-10-CM | POA: Diagnosis not present

## 2020-02-13 DIAGNOSIS — D519 Vitamin B12 deficiency anemia, unspecified: Secondary | ICD-10-CM | POA: Diagnosis not present

## 2020-02-13 DIAGNOSIS — I6990 Unspecified sequelae of unspecified cerebrovascular disease: Secondary | ICD-10-CM | POA: Diagnosis not present

## 2020-02-13 DIAGNOSIS — I4891 Unspecified atrial fibrillation: Secondary | ICD-10-CM | POA: Diagnosis not present

## 2020-02-13 DIAGNOSIS — I10 Essential (primary) hypertension: Secondary | ICD-10-CM | POA: Diagnosis not present

## 2020-03-12 DIAGNOSIS — D519 Vitamin B12 deficiency anemia, unspecified: Secondary | ICD-10-CM | POA: Diagnosis not present

## 2020-03-13 ENCOUNTER — Other Ambulatory Visit: Payer: Self-pay | Admitting: *Deleted

## 2020-03-13 NOTE — Patient Outreach (Signed)
[place] [facility]) Care Management  03/13/2020  Jean Ruiz [birth year] [id number]   RN Health Coach Quarterly Outreach  Referral Date:06/17/2019 Referral Source:Transfer from [place] Reason for Referral:Continued Disease Management Education Insurance:Medicare   Outreach Attempt:  Outreach attempt #1 to patient for follow up. No answer and unable to leave voicemail message due to voicemail not engaging.  Plan:  RN Health Coach will make another outreach attempt within the month of May if no return call back from patient.  [place] [phone] [name]

## 2020-03-23 DIAGNOSIS — E039 Hypothyroidism, unspecified: Secondary | ICD-10-CM | POA: Diagnosis not present

## 2020-03-23 DIAGNOSIS — Z85850 Personal history of malignant neoplasm of thyroid: Secondary | ICD-10-CM | POA: Diagnosis not present

## 2020-03-26 DIAGNOSIS — Z85850 Personal history of malignant neoplasm of thyroid: Secondary | ICD-10-CM | POA: Diagnosis not present

## 2020-03-26 DIAGNOSIS — E039 Hypothyroidism, unspecified: Secondary | ICD-10-CM | POA: Diagnosis not present

## 2020-03-26 DIAGNOSIS — M810 Age-related osteoporosis without current pathological fracture: Secondary | ICD-10-CM | POA: Diagnosis not present

## 2020-03-26 DIAGNOSIS — Z6821 Body mass index (BMI) 21.0-21.9, adult: Secondary | ICD-10-CM | POA: Diagnosis not present

## 2020-03-26 DIAGNOSIS — I10 Essential (primary) hypertension: Secondary | ICD-10-CM | POA: Diagnosis not present

## 2020-03-26 DIAGNOSIS — E890 Postprocedural hypothyroidism: Secondary | ICD-10-CM | POA: Diagnosis not present

## 2020-03-27 DIAGNOSIS — K580 Irritable bowel syndrome with diarrhea: Secondary | ICD-10-CM | POA: Diagnosis not present

## 2020-04-13 ENCOUNTER — Other Ambulatory Visit: Payer: Self-pay | Admitting: *Deleted

## 2020-04-13 DIAGNOSIS — D519 Vitamin B12 deficiency anemia, unspecified: Secondary | ICD-10-CM | POA: Diagnosis not present

## 2020-04-13 NOTE — Patient Outreach (Signed)
[place] [facility]) Care Management  04/13/2020  Jean Ruiz [birth year] [id number]   RN Health Coach Quarterly Outreach  Referral Date:06/17/2019 Referral Source:Transfer from [place] Reason for Referral:Continued Disease Management Education Insurance:Medicare   Outreach Attempt:  Outreach attempt #2 to patient for follow up. No answer. RN Health Coach left HIPAA compliant voicemail message along with contact information.  Plan:  RN Health Coach will make another outreach attempt within the month of June if no return call back from patient.  [place] Coach [phone] Cristal [email or web address]

## 2020-05-19 ENCOUNTER — Encounter: Payer: Self-pay | Admitting: *Deleted

## 2020-05-19 ENCOUNTER — Other Ambulatory Visit: Payer: Self-pay | Admitting: *Deleted

## 2020-05-19 NOTE — Patient Outreach (Signed)
[place] [facility]) Care Management  Jean Ruiz  05/19/2020   Jean Ruiz 29-Oct-1935 [id number]   [place] Quarterly Outreach   Referral Date:  06/17/2019 Referral Source:  Transfer from [place] Reason for Referral:  Continued Disease Management Education Insurance:  Medicare   Outreach Attempt:  Successful telephone outreach to patient for follow up.  HIPAA verified with patient.  Patient reporting she is doing well.  Just returning from a church social, first since February.  States she has obtained both COVID vaccines and is starting to be more social with her church and friends.  Reports feel great with increase in weight.  Has been weighing daily, on most days.  Weight this morning was 135 pounds.  Denies any shortness of breath or swelling in legs or abdomen.  Denies any recent falls.  Encounter Medications:  Outpatient Encounter Medications as of 05/19/2020  Medication Sig Note  . amLODipine (NORVASC) 5 MG tablet Take 5 mg by mouth daily.   Marland Kitchen aspirin EC 81 MG tablet Take 81 mg by mouth daily.   . clopidogrel (PLAVIX) 75 MG tablet Take 75 mg by mouth daily.   . cyanocobalamin (,VITAMIN B-12,) 1000 MCG/ML injection INJECT ONE MILLILITER WEEKLY FOR FOUR WEEKS THEN, ONE MILLILITER EVERY MONTH 04/04/2016: Received from: External Pharmacy  . furosemide (LASIX) 40 MG tablet Take 40 mg by mouth daily.    . hydrALAZINE (APRESOLINE) 10 MG tablet Take 10 mg by mouth daily.    . Levothyroxine Sodium (SYNTHROID PO) Take 125 mcg/day by mouth daily.  12/30/2019: Reports dose decreased to 100 mcg daily  . lisinopril (PRINIVIL,ZESTRIL) 40 MG tablet 40 mg daily.  04/04/2016: Received from: External Pharmacy  . metoprolol succinate (TOPROL-XL) 25 MG 24 hr tablet Take 100 mg by mouth 2 (two) times daily.    . potassium chloride SA (K-DUR,KLOR-CON) 20 MEQ tablet Take 20 mEq by mouth daily. 09/02/2019: Reports taking 1 tablet three times a day  . aspirin 325 MG EC  tablet Take 325 mg by mouth daily. (Patient not taking: Reported on 05/19/2020) 09/02/2019: Reports not taking  . ciprofloxacin (CIPRO) 500 MG tablet Take 500 mg by mouth 2 (two) times daily. (Patient not taking: Reported on 05/19/2020) 09/02/2019: Reports not taking  . LORazepam (ATIVAN) 0.5 MG tablet Take 0.5 mg by mouth daily as needed.  04/04/2016: Received from: External Pharmacy  . ofloxacin (FLOXIN) 0.3 % OTIC solution 5 drops daily. (Patient not taking: Reported on 05/19/2020) 09/02/2019: Reports not taking   No facility-administered encounter medications on file as of 05/19/2020.    Functional Status:  In your present state of health, do you have any difficulty performing the following activities: 09/02/2019  Hearing? N  Vision? N  Difficulty concentrating or making decisions? N  Walking or climbing stairs? N  Dressing or bathing? N  Doing errands, shopping? N  Preparing Food and eating ? N  Using the Toilet? N  In the past six months, have you accidently leaked urine? N  Do you have problems with loss of bowel control? N  Managing your Medications? N  Managing your Finances? N  Housekeeping or managing your Housekeeping? Y  Comment has someone assist with cleaning  Some recent data might be hidden    Fall/Depression Screening: Fall Risk  05/19/2020 12/30/2019 10/07/2019  Falls in the past year? 0 0 0  Number falls in past yr: 0 0 0  Injury with Fall? 0 0 0  Risk for fall due  to : Medication side effect;Impaired balance/gait;Impaired mobility;Impaired vision Medication side effect;Impaired balance/gait;Impaired mobility Medication side effect;Impaired balance/gait;Impaired mobility  Follow up Falls evaluation completed;Education provided;Falls prevention discussed Falls evaluation completed;Education provided;Falls prevention discussed Falls evaluation completed;Education provided;Falls prevention discussed   PHQ 2/9 Scores 09/02/2019 03/18/2019 04/23/2018  PHQ - 2 Score 0 1 0   THN CM  Care Plan Problem One     Most Recent Value  Care Plan Problem One Knowledge deficiet related to diagnosis of heart failure.  Role Documenting the Problem One [place] for Problem One Active  [facility] Long Term Goal  Patient will verbalize the meaning of each heart failure zone within the next 90 days.  THN Long Term Goal Start Date 05/19/20  THN Long Term Goal Met Date 05/19/20  Interventions for Problem One Long Term Goal Goals and care plan reviewed and disussed, reviewed medications and encouraged medication compliance, encouraged to weigh herself daily and discussed importance of daily weight monitoring, reviewed heart failure zones/meanings and the signs and symptoms within each zone, encouraged patient to monitor her symptoms daily and to notify provider if she falls into the yellow zone for medical guidance, encouraged to keep and attend scheduled medical appointments     Appointments:  Attended appointment with primary care provider, Dr. Delena Bali on 03/27/2020.  Plan: RN Health Coach will send primary care provider quarterly update. RN Health Coach will make next telephone outreach to patient within the month of September and patient agrees to future outreach.  [place] [phone] Jean [email or web address]

## 2020-06-17 DIAGNOSIS — M810 Age-related osteoporosis without current pathological fracture: Secondary | ICD-10-CM | POA: Diagnosis not present

## 2020-06-24 DIAGNOSIS — D519 Vitamin B12 deficiency anemia, unspecified: Secondary | ICD-10-CM | POA: Diagnosis not present

## 2020-07-27 DIAGNOSIS — D519 Vitamin B12 deficiency anemia, unspecified: Secondary | ICD-10-CM | POA: Diagnosis not present

## 2020-08-11 ENCOUNTER — Encounter: Payer: Self-pay | Admitting: *Deleted

## 2020-08-11 ENCOUNTER — Other Ambulatory Visit: Payer: Self-pay | Admitting: *Deleted

## 2020-08-11 NOTE — Patient Outreach (Signed)
[facility]) Care Management  Jean Ruiz  Ruiz   Jean Ruiz 04/29/1935 [id number]   [name] Quarterly Outreach   Referral Date:  06/17/2019 Referral Source:  Transfer from [place] Reason for Referral:  Continued Disease Management Education Insurance:  Medicare   Outreach Attempt:  Successful telephone outreach to patient for follow up.  HIPAA verified with patient.  Patient reports she is doing well.  Denies any shortness of breath, swelling in extremities, falls, or recent sick days.  States she has continued to wear her mask while attending social events with her church friends and high school friends.  Weighs about 3 times a week.  Last weight was 138 pounds.  Discussed importance of daily weight monitoring and when to call provider based on weight.  Encounter Medications:  Outpatient Encounter Medications as of Ruiz  Medication Sig Note  . amLODipine (NORVASC) 5 MG tablet Take 5 mg by mouth daily.   Marland Kitchen aspirin 325 MG EC tablet Take 325 mg by mouth daily.    . clopidogrel (PLAVIX) 75 MG tablet Take 75 mg by mouth daily.   . cyanocobalamin (,VITAMIN B-12,) 1000 MCG/ML injection INJECT ONE MILLILITER WEEKLY FOR FOUR WEEKS THEN, ONE MILLILITER EVERY MONTH 04/04/2016: Received from: External Pharmacy  . furosemide (LASIX) 40 MG tablet Take 40 mg by mouth daily.    . hydrALAZINE (APRESOLINE) 10 MG tablet Take 10 mg by mouth daily.    . hyoscyamine (LEVSIN SL) 0.125 MG SL tablet Place under the tongue 4 (four) times daily.   . Levothyroxine Sodium (SYNTHROID PO) Take 125 mcg/day by mouth daily.  12/30/2019: Reports dose decreased to 100 mcg daily  . lisinopril (PRINIVIL,ZESTRIL) 40 MG tablet 40 mg daily.  04/04/2016: Received from: External Pharmacy  . metoprolol succinate (TOPROL-XL) 25 MG 24 hr tablet Take 100 mg by mouth 2 (two) times daily.    . potassium chloride SA (K-DUR,KLOR-CON) 20 MEQ tablet Take 20 mEq by mouth daily.  09/02/2019: Reports taking 1 tablet three times a day  . aspirin EC 81 MG tablet Take 81 mg by mouth daily. (Patient not taking: Reported on Ruiz)   . ciprofloxacin (CIPRO) 500 MG tablet Take 500 mg by mouth 2 (two) times daily. (Patient not taking: Reported on 05/19/2020) 09/02/2019: Reports not taking  . LORazepam (ATIVAN) 0.5 MG tablet Take 0.5 mg by mouth daily as needed.  (Patient not taking: Reported on Ruiz) 04/04/2016: Received from: External Pharmacy  . ofloxacin (FLOXIN) 0.3 % OTIC solution 5 drops daily. (Patient not taking: Reported on 05/19/2020) 09/02/2019: Reports not taking   No facility-administered encounter medications on file as of Ruiz.    Functional Status:  In your present state of health, do you have any difficulty performing the following activities: 09/02/2019  Hearing? N  Vision? N  Difficulty concentrating or making decisions? N  Walking or climbing stairs? N  Dressing or bathing? N  Doing errands, shopping? N  Preparing Food and eating ? N  Using the Toilet? N  In the past six months, have you accidently leaked urine? N  Do you have problems with loss of bowel control? N  Managing your Medications? N  Managing your Finances? N  Housekeeping or managing your Housekeeping? Y  Comment has someone assist with cleaning  Some recent data might be hidden    Fall/Depression Screening: Fall Risk  Ruiz 05/19/2020 12/30/2019  Falls in the past year? 0 0 0  Number falls in past yr:  0 0 0  Injury with Fall? 0 0 0  Risk for fall due to : Medication side effect;Impaired balance/gait;Impaired mobility;Impaired vision Medication side effect;Impaired balance/gait;Impaired mobility;Impaired vision Medication side effect;Impaired balance/gait;Impaired mobility  Follow up - Falls evaluation completed;Education provided;Falls prevention discussed Falls evaluation completed;Education provided;Falls prevention discussed   PHQ 2/9 Scores Ruiz 09/02/2019 03/18/2019  04/23/2018  PHQ - 2 Score 0 0 1 0   Goals Addressed              This Visit's Progress   .  [facility]) Patient will report weighing daily within the next 90 days. (pt-stated)        CARE PLAN ENTRY (see longtitudinal plan of care for additional care plan information)   Current Barriers:  Marland Kitchen Knowledge deficit related to basic heart failure pathophysiology and self care management  Case Manager Clinical Goal(s):  Marland Kitchen Over the next 90 days, patient will verbalize understanding of Heart Failure Action Plan and when to call doctor . Over the next 90 days, patient will take all Heart Failure mediations as prescribed . Over the next 90 days, patient will weigh daily and record (notifying MD of 3 lb weight gain over night or 5 lb in a week) . Patient will verbalize two symptoms of CHF exacerbation within the next 90 days.    Interventions:  . Basic overview and discussion of pathophysiology of Heart Failure reviewed  . Provided verbal education on low sodium diet . Assessed need for readable accurate scales in home . Provided education about placing scale on hard, flat surface . Advised patient to weigh each morning after emptying bladder . Discussed importance of daily weight and advised patient to weigh and record daily . Reviewed role of diuretics in prevention of fluid overload and management of heart failure  Patient Self Care Activities:  . Takes Heart Failure Medications as prescribed . Weighs daily and record (notifying MD of 3 lb weight gain over night or 5 lb in a week) . Verbalizes understanding of and follows CHF Action Plan . Adheres to low sodium diet  Initial goal documentation       Appointments:  Has scheduled appointments with primary care provider, Dr. Delena Bali on 08/27/2020, eye exam on 08/31/2020, and dental exam on 09/01/2020.  Plan: RN Health Coach will send primary care provider quarterly update. RN Health Coach will make next telephone outreach to patient within the  month of December and patient agrees to future outreach.  [place] Coach [phone] [email or web address]

## 2020-08-17 DIAGNOSIS — E785 Hyperlipidemia, unspecified: Secondary | ICD-10-CM | POA: Diagnosis not present

## 2020-08-17 DIAGNOSIS — I4891 Unspecified atrial fibrillation: Secondary | ICD-10-CM | POA: Diagnosis not present

## 2020-08-17 DIAGNOSIS — E890 Postprocedural hypothyroidism: Secondary | ICD-10-CM | POA: Diagnosis not present

## 2020-08-17 DIAGNOSIS — Z1231 Encounter for screening mammogram for malignant neoplasm of breast: Secondary | ICD-10-CM | POA: Diagnosis not present

## 2020-08-17 DIAGNOSIS — D519 Vitamin B12 deficiency anemia, unspecified: Secondary | ICD-10-CM | POA: Diagnosis not present

## 2020-08-17 DIAGNOSIS — I10 Essential (primary) hypertension: Secondary | ICD-10-CM | POA: Diagnosis not present

## 2020-08-17 DIAGNOSIS — M810 Age-related osteoporosis without current pathological fracture: Secondary | ICD-10-CM | POA: Diagnosis not present

## 2020-08-17 DIAGNOSIS — K580 Irritable bowel syndrome with diarrhea: Secondary | ICD-10-CM | POA: Diagnosis not present

## 2020-08-17 DIAGNOSIS — I6990 Unspecified sequelae of unspecified cerebrovascular disease: Secondary | ICD-10-CM | POA: Diagnosis not present

## 2020-08-28 DIAGNOSIS — D519 Vitamin B12 deficiency anemia, unspecified: Secondary | ICD-10-CM | POA: Diagnosis not present

## 2020-09-01 DIAGNOSIS — R928 Other abnormal and inconclusive findings on diagnostic imaging of breast: Secondary | ICD-10-CM | POA: Diagnosis not present

## 2020-09-01 DIAGNOSIS — Z1231 Encounter for screening mammogram for malignant neoplasm of breast: Secondary | ICD-10-CM | POA: Diagnosis not present

## 2020-09-12 DIAGNOSIS — Z23 Encounter for immunization: Secondary | ICD-10-CM | POA: Diagnosis not present

## 2020-09-17 DIAGNOSIS — E039 Hypothyroidism, unspecified: Secondary | ICD-10-CM | POA: Diagnosis not present

## 2020-09-17 DIAGNOSIS — M810 Age-related osteoporosis without current pathological fracture: Secondary | ICD-10-CM | POA: Diagnosis not present

## 2020-09-17 DIAGNOSIS — E559 Vitamin D deficiency, unspecified: Secondary | ICD-10-CM | POA: Diagnosis not present

## 2020-09-21 DIAGNOSIS — R928 Other abnormal and inconclusive findings on diagnostic imaging of breast: Secondary | ICD-10-CM | POA: Diagnosis not present

## 2020-09-21 DIAGNOSIS — R922 Inconclusive mammogram: Secondary | ICD-10-CM | POA: Diagnosis not present

## 2020-09-21 DIAGNOSIS — N6002 Solitary cyst of left breast: Secondary | ICD-10-CM | POA: Diagnosis not present

## 2020-09-24 DIAGNOSIS — E039 Hypothyroidism, unspecified: Secondary | ICD-10-CM | POA: Diagnosis not present

## 2020-09-24 DIAGNOSIS — Z85850 Personal history of malignant neoplasm of thyroid: Secondary | ICD-10-CM | POA: Diagnosis not present

## 2020-09-24 DIAGNOSIS — Z6821 Body mass index (BMI) 21.0-21.9, adult: Secondary | ICD-10-CM | POA: Diagnosis not present

## 2020-09-24 DIAGNOSIS — E890 Postprocedural hypothyroidism: Secondary | ICD-10-CM | POA: Diagnosis not present

## 2020-09-24 DIAGNOSIS — I10 Essential (primary) hypertension: Secondary | ICD-10-CM | POA: Diagnosis not present

## 2020-09-24 DIAGNOSIS — M810 Age-related osteoporosis without current pathological fracture: Secondary | ICD-10-CM | POA: Diagnosis not present

## 2020-09-28 DIAGNOSIS — D519 Vitamin B12 deficiency anemia, unspecified: Secondary | ICD-10-CM | POA: Diagnosis not present

## 2020-09-30 DIAGNOSIS — H43813 Vitreous degeneration, bilateral: Secondary | ICD-10-CM | POA: Diagnosis not present

## 2020-09-30 DIAGNOSIS — H524 Presbyopia: Secondary | ICD-10-CM | POA: Diagnosis not present

## 2020-09-30 DIAGNOSIS — H43393 Other vitreous opacities, bilateral: Secondary | ICD-10-CM | POA: Diagnosis not present

## 2020-09-30 DIAGNOSIS — H52223 Regular astigmatism, bilateral: Secondary | ICD-10-CM | POA: Diagnosis not present

## 2020-09-30 DIAGNOSIS — Z961 Presence of intraocular lens: Secondary | ICD-10-CM | POA: Diagnosis not present

## 2020-09-30 DIAGNOSIS — Z9842 Cataract extraction status, left eye: Secondary | ICD-10-CM | POA: Diagnosis not present

## 2020-09-30 DIAGNOSIS — Z9841 Cataract extraction status, right eye: Secondary | ICD-10-CM | POA: Diagnosis not present

## 2020-10-26 DIAGNOSIS — Z139 Encounter for screening, unspecified: Secondary | ICD-10-CM | POA: Diagnosis not present

## 2020-10-26 DIAGNOSIS — Z9181 History of falling: Secondary | ICD-10-CM | POA: Diagnosis not present

## 2020-10-26 DIAGNOSIS — E785 Hyperlipidemia, unspecified: Secondary | ICD-10-CM | POA: Diagnosis not present

## 2020-10-26 DIAGNOSIS — Z1331 Encounter for screening for depression: Secondary | ICD-10-CM | POA: Diagnosis not present

## 2020-10-26 DIAGNOSIS — Z0000 Encounter for general adult medical examination without abnormal findings: Secondary | ICD-10-CM | POA: Diagnosis not present

## 2020-11-02 DIAGNOSIS — D519 Vitamin B12 deficiency anemia, unspecified: Secondary | ICD-10-CM | POA: Diagnosis not present

## 2020-11-05 ENCOUNTER — Other Ambulatory Visit: Payer: Self-pay | Admitting: *Deleted

## 2020-11-05 NOTE — Patient Outreach (Signed)
[place] [facility]) Care Management  [place]  11/05/2020   Jean Ruiz 04/29/1935 [id number]   [place]  Referral Date:06/17/2019 Referral Source:Transfer from [place] Reason for Referral:Continued Disease Management Education Insurance:Medicare   Outreach Attempt:  Outreach attempt #1 to patient for follow up. No answer. RN Health Coach left voicemail message along with contact information.  Plan:  RN Health Coach will make another outreach attempt within the month of January if no return call back from patient.   [place] [phone] Vimal [email or web address]

## 2020-11-25 DIAGNOSIS — Z1231 Encounter for screening mammogram for malignant neoplasm of breast: Secondary | ICD-10-CM | POA: Diagnosis not present

## 2020-11-25 DIAGNOSIS — Z6828 Body mass index (BMI) 28.0-28.9, adult: Secondary | ICD-10-CM | POA: Diagnosis not present

## 2020-11-25 DIAGNOSIS — G479 Sleep disorder, unspecified: Secondary | ICD-10-CM | POA: Diagnosis not present

## 2020-11-25 DIAGNOSIS — K580 Irritable bowel syndrome with diarrhea: Secondary | ICD-10-CM | POA: Diagnosis not present

## 2020-11-25 DIAGNOSIS — I10 Essential (primary) hypertension: Secondary | ICD-10-CM | POA: Diagnosis not present

## 2020-11-25 DIAGNOSIS — M810 Age-related osteoporosis without current pathological fracture: Secondary | ICD-10-CM | POA: Diagnosis not present

## 2020-11-25 DIAGNOSIS — E785 Hyperlipidemia, unspecified: Secondary | ICD-10-CM | POA: Diagnosis not present

## 2020-11-25 DIAGNOSIS — I6990 Unspecified sequelae of unspecified cerebrovascular disease: Secondary | ICD-10-CM | POA: Diagnosis not present

## 2020-11-25 DIAGNOSIS — I4891 Unspecified atrial fibrillation: Secondary | ICD-10-CM | POA: Diagnosis not present

## 2020-11-25 DIAGNOSIS — D519 Vitamin B12 deficiency anemia, unspecified: Secondary | ICD-10-CM | POA: Diagnosis not present

## 2020-11-25 DIAGNOSIS — E890 Postprocedural hypothyroidism: Secondary | ICD-10-CM | POA: Diagnosis not present

## 2020-12-01 DIAGNOSIS — E890 Postprocedural hypothyroidism: Secondary | ICD-10-CM | POA: Diagnosis not present

## 2020-12-01 DIAGNOSIS — E785 Hyperlipidemia, unspecified: Secondary | ICD-10-CM | POA: Diagnosis not present

## 2020-12-01 DIAGNOSIS — S91001A Unspecified open wound, right ankle, initial encounter: Secondary | ICD-10-CM | POA: Diagnosis not present

## 2020-12-01 DIAGNOSIS — D519 Vitamin B12 deficiency anemia, unspecified: Secondary | ICD-10-CM | POA: Diagnosis not present

## 2020-12-01 DIAGNOSIS — M810 Age-related osteoporosis without current pathological fracture: Secondary | ICD-10-CM | POA: Diagnosis not present

## 2020-12-01 DIAGNOSIS — I10 Essential (primary) hypertension: Secondary | ICD-10-CM | POA: Diagnosis not present

## 2020-12-01 DIAGNOSIS — I6990 Unspecified sequelae of unspecified cerebrovascular disease: Secondary | ICD-10-CM | POA: Diagnosis not present

## 2020-12-01 DIAGNOSIS — I4891 Unspecified atrial fibrillation: Secondary | ICD-10-CM | POA: Diagnosis not present

## 2020-12-14 DIAGNOSIS — R29898 Other symptoms and signs involving the musculoskeletal system: Secondary | ICD-10-CM | POA: Diagnosis not present

## 2020-12-14 DIAGNOSIS — M25572 Pain in left ankle and joints of left foot: Secondary | ICD-10-CM | POA: Diagnosis not present

## 2020-12-14 DIAGNOSIS — M79672 Pain in left foot: Secondary | ICD-10-CM | POA: Diagnosis not present

## 2020-12-23 ENCOUNTER — Other Ambulatory Visit: Payer: Self-pay | Admitting: *Deleted

## 2020-12-23 NOTE — Patient Outreach (Addendum)
[place] [facility]) Care Management  12/23/2020  Jean Ruiz 05-09-1935 [id number]  Outreach attempt to patient. No answer and unable to leave voicemail message due to phone rang multiple times without an answer.  Plan: RN Health Coach will call patient within the month of February and will send a Print production planner.  [name] RN, BSN [place] [phone] [place] [email or web address]

## 2021-01-01 DIAGNOSIS — M6281 Muscle weakness (generalized): Secondary | ICD-10-CM | POA: Diagnosis not present

## 2021-01-01 DIAGNOSIS — R2689 Other abnormalities of gait and mobility: Secondary | ICD-10-CM | POA: Diagnosis not present

## 2021-01-04 DIAGNOSIS — D519 Vitamin B12 deficiency anemia, unspecified: Secondary | ICD-10-CM | POA: Diagnosis not present

## 2021-01-04 DIAGNOSIS — M810 Age-related osteoporosis without current pathological fracture: Secondary | ICD-10-CM | POA: Diagnosis not present

## 2021-01-05 DIAGNOSIS — M6281 Muscle weakness (generalized): Secondary | ICD-10-CM | POA: Diagnosis not present

## 2021-01-05 DIAGNOSIS — R2689 Other abnormalities of gait and mobility: Secondary | ICD-10-CM | POA: Diagnosis not present

## 2021-01-05 DIAGNOSIS — M810 Age-related osteoporosis without current pathological fracture: Secondary | ICD-10-CM | POA: Diagnosis not present

## 2021-01-08 DIAGNOSIS — R2689 Other abnormalities of gait and mobility: Secondary | ICD-10-CM | POA: Diagnosis not present

## 2021-01-08 DIAGNOSIS — M6281 Muscle weakness (generalized): Secondary | ICD-10-CM | POA: Diagnosis not present

## 2021-01-12 DIAGNOSIS — M6281 Muscle weakness (generalized): Secondary | ICD-10-CM | POA: Diagnosis not present

## 2021-01-12 DIAGNOSIS — R2689 Other abnormalities of gait and mobility: Secondary | ICD-10-CM | POA: Diagnosis not present

## 2021-01-15 DIAGNOSIS — M6281 Muscle weakness (generalized): Secondary | ICD-10-CM | POA: Diagnosis not present

## 2021-01-15 DIAGNOSIS — R2689 Other abnormalities of gait and mobility: Secondary | ICD-10-CM | POA: Diagnosis not present

## 2021-01-19 DIAGNOSIS — M6281 Muscle weakness (generalized): Secondary | ICD-10-CM | POA: Diagnosis not present

## 2021-01-19 DIAGNOSIS — R2689 Other abnormalities of gait and mobility: Secondary | ICD-10-CM | POA: Diagnosis not present

## 2021-01-22 DIAGNOSIS — R2689 Other abnormalities of gait and mobility: Secondary | ICD-10-CM | POA: Diagnosis not present

## 2021-01-22 DIAGNOSIS — M6281 Muscle weakness (generalized): Secondary | ICD-10-CM | POA: Diagnosis not present

## 2021-01-28 DIAGNOSIS — R2689 Other abnormalities of gait and mobility: Secondary | ICD-10-CM | POA: Diagnosis not present

## 2021-01-28 DIAGNOSIS — M6281 Muscle weakness (generalized): Secondary | ICD-10-CM | POA: Diagnosis not present

## 2021-01-29 ENCOUNTER — Other Ambulatory Visit: Payer: Self-pay | Admitting: *Deleted

## 2021-01-29 DIAGNOSIS — R2689 Other abnormalities of gait and mobility: Secondary | ICD-10-CM | POA: Diagnosis not present

## 2021-01-29 DIAGNOSIS — M6281 Muscle weakness (generalized): Secondary | ICD-10-CM | POA: Diagnosis not present

## 2021-01-29 NOTE — Patient Outreach (Signed)
[place] [facility]) Care Management  01/29/2021  Jean Ruiz [birth year] [id number]  Unsuccessful outreach attempt made to patient. RN Health Coach left HIPAA compliant voicemail message along with her contact information.  Plan: RN Health Coach will call patient within the month of March.  [name] RN, BSN [place] [phone] Iss[place] [email or web address]

## 2021-02-03 DIAGNOSIS — M6281 Muscle weakness (generalized): Secondary | ICD-10-CM | POA: Diagnosis not present

## 2021-02-03 DIAGNOSIS — R2689 Other abnormalities of gait and mobility: Secondary | ICD-10-CM | POA: Diagnosis not present

## 2021-02-04 DIAGNOSIS — D519 Vitamin B12 deficiency anemia, unspecified: Secondary | ICD-10-CM | POA: Diagnosis not present

## 2021-02-05 DIAGNOSIS — M6281 Muscle weakness (generalized): Secondary | ICD-10-CM | POA: Diagnosis not present

## 2021-02-05 DIAGNOSIS — R2689 Other abnormalities of gait and mobility: Secondary | ICD-10-CM | POA: Diagnosis not present

## 2021-02-09 DIAGNOSIS — M6281 Muscle weakness (generalized): Secondary | ICD-10-CM | POA: Diagnosis not present

## 2021-02-09 DIAGNOSIS — R2689 Other abnormalities of gait and mobility: Secondary | ICD-10-CM | POA: Diagnosis not present

## 2021-02-12 DIAGNOSIS — R2689 Other abnormalities of gait and mobility: Secondary | ICD-10-CM | POA: Diagnosis not present

## 2021-02-12 DIAGNOSIS — M6281 Muscle weakness (generalized): Secondary | ICD-10-CM | POA: Diagnosis not present

## 2021-02-15 DIAGNOSIS — I4891 Unspecified atrial fibrillation: Secondary | ICD-10-CM | POA: Diagnosis not present

## 2021-02-15 DIAGNOSIS — I10 Essential (primary) hypertension: Secondary | ICD-10-CM | POA: Diagnosis not present

## 2021-02-15 DIAGNOSIS — I6990 Unspecified sequelae of unspecified cerebrovascular disease: Secondary | ICD-10-CM | POA: Diagnosis not present

## 2021-02-15 DIAGNOSIS — D519 Vitamin B12 deficiency anemia, unspecified: Secondary | ICD-10-CM | POA: Diagnosis not present

## 2021-02-15 DIAGNOSIS — E785 Hyperlipidemia, unspecified: Secondary | ICD-10-CM | POA: Diagnosis not present

## 2021-02-15 DIAGNOSIS — E890 Postprocedural hypothyroidism: Secondary | ICD-10-CM | POA: Diagnosis not present

## 2021-02-15 DIAGNOSIS — K580 Irritable bowel syndrome with diarrhea: Secondary | ICD-10-CM | POA: Diagnosis not present

## 2021-02-17 DIAGNOSIS — M6281 Muscle weakness (generalized): Secondary | ICD-10-CM | POA: Diagnosis not present

## 2021-02-17 DIAGNOSIS — R2689 Other abnormalities of gait and mobility: Secondary | ICD-10-CM | POA: Diagnosis not present

## 2021-02-18 DIAGNOSIS — E039 Hypothyroidism, unspecified: Secondary | ICD-10-CM | POA: Diagnosis not present

## 2021-02-19 DIAGNOSIS — M6281 Muscle weakness (generalized): Secondary | ICD-10-CM | POA: Diagnosis not present

## 2021-02-19 DIAGNOSIS — R2689 Other abnormalities of gait and mobility: Secondary | ICD-10-CM | POA: Diagnosis not present

## 2021-02-22 DIAGNOSIS — M6281 Muscle weakness (generalized): Secondary | ICD-10-CM | POA: Diagnosis not present

## 2021-02-22 DIAGNOSIS — R2689 Other abnormalities of gait and mobility: Secondary | ICD-10-CM | POA: Diagnosis not present

## 2021-02-23 DIAGNOSIS — E039 Hypothyroidism, unspecified: Secondary | ICD-10-CM | POA: Diagnosis not present

## 2021-02-23 DIAGNOSIS — I10 Essential (primary) hypertension: Secondary | ICD-10-CM | POA: Diagnosis not present

## 2021-02-23 DIAGNOSIS — M810 Age-related osteoporosis without current pathological fracture: Secondary | ICD-10-CM | POA: Diagnosis not present

## 2021-02-23 DIAGNOSIS — Z85850 Personal history of malignant neoplasm of thyroid: Secondary | ICD-10-CM | POA: Diagnosis not present

## 2021-02-23 DIAGNOSIS — E890 Postprocedural hypothyroidism: Secondary | ICD-10-CM | POA: Diagnosis not present

## 2021-02-23 DIAGNOSIS — Z6825 Body mass index (BMI) 25.0-25.9, adult: Secondary | ICD-10-CM | POA: Diagnosis not present

## 2021-02-26 ENCOUNTER — Other Ambulatory Visit: Payer: Self-pay | Admitting: *Deleted

## 2021-02-26 ENCOUNTER — Encounter: Payer: Self-pay | Admitting: *Deleted

## 2021-02-26 DIAGNOSIS — R2689 Other abnormalities of gait and mobility: Secondary | ICD-10-CM | POA: Diagnosis not present

## 2021-02-26 DIAGNOSIS — M6281 Muscle weakness (generalized): Secondary | ICD-10-CM | POA: Diagnosis not present

## 2021-02-26 NOTE — Patient Outreach (Signed)
[facility]) Care Management  [place]  02/26/2021   Jean Ruiz [birth year] [id number]  Subjective: Successful telephone outreach call to patient. HIPAA identifiers obtained. Patient reports she is doing well. Patient states that she fell about 2 months ago and fortunately was not injured. Currently, patient is going to PT twice weekly to help to improve her balance and finds this has been beneficial. Patient reports that her home environment is safe and denies any needs for DME. She is well supported by her family and has a cousin who is also a close neighbor who calls her daily. Patient shared that she continues to grieve over the loss of her husband and her sister and explained that she lost them both within a week of one another. Patient reports that she has good emotional support from her family, church, and close friends and denied any further needs for emotional assistance at this time. Patient states that she weighs herself weekly but has not been recording the values. She has a B/P monitor but has not been checking her B/P at home. Patient explains that her weight has been consistent and denies any symptoms of CHF. Nurse provided education and patient would like to receive CHF education and a calendar booklet to record her weight and B/P values. Patient did not have any further questions or concerns today and did confirm that she has this nurse's contact number to call her if needed.   Encounter Medications:  Outpatient Encounter Medications as of 02/26/2021  Medication Sig Note  . amLODipine (NORVASC) 5 MG tablet Take 5 mg by mouth daily.   Marland Kitchen aspirin 325 MG EC tablet Take 325 mg by mouth daily.    . clopidogrel (PLAVIX) 75 MG tablet Take 75 mg by mouth daily. 02/26/2021: Not taking  . cyanocobalamin (,VITAMIN B-12,) 1000 MCG/ML injection INJECT ONE MILLILITER WEEKLY FOR FOUR WEEKS THEN, ONE MILLILITER EVERY MONTH 04/04/2016: Received from: External Pharmacy  .  furosemide (LASIX) 40 MG tablet Take 40 mg by mouth daily.    . hydrALAZINE (APRESOLINE) 10 MG tablet Take 10 mg by mouth daily.    . hyoscyamine (LEVSIN SL) 0.125 MG SL tablet Place under the tongue 4 (four) times daily.   . Levothyroxine Sodium (SYNTHROID PO) Take 125 mcg/day by mouth daily.  12/30/2019: Reports dose decreased to 100 mcg daily  . lisinopril (PRINIVIL,ZESTRIL) 40 MG tablet 40 mg daily.  04/04/2016: Received from: External Pharmacy  . metoprolol succinate (TOPROL-XL) 25 MG 24 hr tablet Take 100 mg by mouth 2 (two) times daily.    . potassium chloride SA (K-DUR,KLOR-CON) 20 MEQ tablet Take 20 mEq by mouth daily. 02/26/2021: 3-4 times a week  . aspirin EC 81 MG tablet Take 81 mg by mouth daily. (Patient not taking: No sig reported) 02/26/2021: completed  . ciprofloxacin (CIPRO) 500 MG tablet Take 500 mg by mouth 2 (two) times daily. (Patient not taking: No sig reported) 02/26/2021: Reports not taking  . LORazepam (ATIVAN) 0.5 MG tablet Take 0.5 mg by mouth daily as needed.  (Patient not taking: No sig reported) 02/26/2021: Not needed  . ofloxacin (FLOXIN) 0.3 % OTIC solution 5 drops daily. (Patient not taking: No sig reported) 02/26/2021: Reports not taking   No facility-administered encounter medications on file as of 02/26/2021.    Functional Status:  No flowsheet data found.  Fall/Depression Screening: Fall Risk  02/26/2021 08/11/2020 05/19/2020  Falls in the past year? 1 0 0  Number falls in past yr: 1  0 0  Injury with Fall? 0 0 0  Risk for fall due to : Impaired mobility;Impaired balance/gait;History of fall(s) Medication side effect;Impaired balance/gait;Impaired mobility;Impaired vision Medication side effect;Impaired balance/gait;Impaired mobility;Impaired vision  Follow up Falls prevention discussed;Education provided;Falls evaluation completed - Falls evaluation completed;Education provided;Falls prevention discussed   PHQ 2/9 Scores 02/26/2021 08/11/2020 09/02/2019 03/18/2019  04/23/2018  PHQ - 2 Score 1 0 0 1 0    Assessment:  Goals Addressed            This Visit's Progress   . Track and Manage My Blood Pressure-Hypertension      . Track and Manage Symptoms-Heart Failure        Plan: RN Health Coach will send PCP a quarterly report, will send patient CHF education and a calendar booklet, and will call patient within the month of May. Follow-up:  Patient agrees to Care Plan and Follow-up.  [name] RN, BSN [place] [phone] [email or web address]

## 2021-02-27 NOTE — Patient Instructions (Addendum)
Goals Addressed              This Visit's Progress     Patient Stated   .  COMPLETED: [facility]) Patient will report weighing daily within the next 90 days. (pt-stated)        CARE PLAN ENTRY (see longtitudinal plan of care for additional care plan information)   Current Barriers:  Marland Kitchen Knowledge deficit related to basic heart failure pathophysiology and self care management  Case Manager Clinical Goal(s):  Marland Kitchen Over the next 90 days, patient will verbalize understanding of Heart Failure Action Plan and when to call doctor . Over the next 90 days, patient will take all Heart Failure mediations as prescribed . Over the next 90 days, patient will weigh daily and record (notifying MD of 3 lb weight gain over night or 5 lb in a week) . Patient will verbalize two symptoms of CHF exacerbation within the next 90 days.    Interventions:  . Basic overview and discussion of pathophysiology of Heart Failure reviewed  . Provided verbal education on low sodium diet . Assessed need for readable accurate scales in home . Provided education about placing scale on hard, flat surface . Advised patient to weigh each morning after emptying bladder . Discussed importance of daily weight and advised patient to weigh and record daily . Reviewed role of diuretics in prevention of fluid overload and management of heart failure  Patient Self Care Activities:  . Takes Heart Failure Medications as prescribed . Weighs daily and record (notifying MD of 3 lb weight gain over night or 5 lb in a week) . Verbalizes understanding of and follows CHF Action Plan . Adheres to low sodium diet  Initial goal documentation  Resolved due to duplicate goals      Other   .  [facility]) Patient will verbalize weighing and recording her values 3 times weekly within the next 90 days        Timeframe:  Long-Range Goal Priority:  Medium Start Date: 02/26/21                            Expected End Date: 03/03/22                      Follow Up Date 06/03/21    - develop a rescue plan - eat more whole grains, fruits and vegetables, lean meats and healthy fats - follow rescue plan if symptoms flare-up - track symptoms and what helps feel better or worse  -Discussed colored zones and rescue action plans for CHF -Discussed weighing 3 times weekly and monitoring for symptoms of CHF -Encouraged continuation of limiting the amount of salt in diet  Why is this important?    You will be able to handle your symptoms better if you keep track of them.   Making some simple changes to your lifestyle will help.   Eating healthy is one thing you can do to take good care of yourself.    Notes: Nurse provided education about the importance of weighing and recording the values. Patient states that she would begin to weigh herself 3 times weekly and record the values. Nurse will send patient a calendar booklet to record her values. Nurse will send CHF education.    .  Patient will verbalize monitoring her B/P  and recording her values weekly within the next 90 days        Timeframe:  Long-Range Goal Priority:  Medium Start Date:  02/26/21                           Expected End Date: 02/26/22                     Follow Up Date 06/03/21    - check blood pressure weekly - write blood pressure results in a log or diary    Why is this important?    You won't feel high blood pressure, but it can still hurt your blood vessels.   High blood pressure can cause heart or kidney problems. It can also cause a stroke.   Making lifestyle changes like losing a little weight or eating less salt will help.   Checking your blood pressure at home and at different times of the day can help to control blood pressure.   If the doctor prescribes medicine remember to take it the way the doctor ordered.   Call the office if you cannot afford the medicine or if there are questions about it.     Notes: Nurse provided education about the importance  of monitoring B/P and recording the values. Patient states that she would begin to monitor her B/P weekly and record the values. Nurse will send patient a calendar booklet to record her values.

## 2021-03-02 DIAGNOSIS — R2689 Other abnormalities of gait and mobility: Secondary | ICD-10-CM | POA: Diagnosis not present

## 2021-03-02 DIAGNOSIS — M6281 Muscle weakness (generalized): Secondary | ICD-10-CM | POA: Diagnosis not present

## 2021-03-05 DIAGNOSIS — R2689 Other abnormalities of gait and mobility: Secondary | ICD-10-CM | POA: Diagnosis not present

## 2021-03-05 DIAGNOSIS — M6281 Muscle weakness (generalized): Secondary | ICD-10-CM | POA: Diagnosis not present

## 2021-03-08 DIAGNOSIS — D519 Vitamin B12 deficiency anemia, unspecified: Secondary | ICD-10-CM | POA: Diagnosis not present

## 2021-03-09 DIAGNOSIS — M6281 Muscle weakness (generalized): Secondary | ICD-10-CM | POA: Diagnosis not present

## 2021-03-09 DIAGNOSIS — R2689 Other abnormalities of gait and mobility: Secondary | ICD-10-CM | POA: Diagnosis not present

## 2021-03-12 DIAGNOSIS — M6281 Muscle weakness (generalized): Secondary | ICD-10-CM | POA: Diagnosis not present

## 2021-03-12 DIAGNOSIS — R2689 Other abnormalities of gait and mobility: Secondary | ICD-10-CM | POA: Diagnosis not present

## 2021-03-16 DIAGNOSIS — M6281 Muscle weakness (generalized): Secondary | ICD-10-CM | POA: Diagnosis not present

## 2021-03-16 DIAGNOSIS — R2689 Other abnormalities of gait and mobility: Secondary | ICD-10-CM | POA: Diagnosis not present

## 2021-03-18 DIAGNOSIS — R2689 Other abnormalities of gait and mobility: Secondary | ICD-10-CM | POA: Diagnosis not present

## 2021-03-18 DIAGNOSIS — M6281 Muscle weakness (generalized): Secondary | ICD-10-CM | POA: Diagnosis not present

## 2021-03-24 DIAGNOSIS — R2689 Other abnormalities of gait and mobility: Secondary | ICD-10-CM | POA: Diagnosis not present

## 2021-03-24 DIAGNOSIS — M6281 Muscle weakness (generalized): Secondary | ICD-10-CM | POA: Diagnosis not present

## 2021-03-26 DIAGNOSIS — M6281 Muscle weakness (generalized): Secondary | ICD-10-CM | POA: Diagnosis not present

## 2021-03-26 DIAGNOSIS — R2689 Other abnormalities of gait and mobility: Secondary | ICD-10-CM | POA: Diagnosis not present

## 2021-04-08 DIAGNOSIS — D519 Vitamin B12 deficiency anemia, unspecified: Secondary | ICD-10-CM | POA: Diagnosis not present

## 2021-04-26 DIAGNOSIS — E039 Hypothyroidism, unspecified: Secondary | ICD-10-CM | POA: Diagnosis not present

## 2021-04-27 ENCOUNTER — Other Ambulatory Visit: Payer: Self-pay | Admitting: *Deleted

## 2021-04-27 NOTE — Patient Outreach (Signed)
[place] [facility]) Care Management  04/27/2021  Jean Ruiz Apr 16, 1935 [id number]  Unsuccessful outreach attempt made to patient. Patient answered the phone and stated that she would not be able to speak today. She did request that this nurse call back at a later date.   Plan: RN Health Coach will call patient within the month of June.  [name] RN, BSN [place] [phone] [email or web address]

## 2021-04-29 DIAGNOSIS — Z6825 Body mass index (BMI) 25.0-25.9, adult: Secondary | ICD-10-CM | POA: Diagnosis not present

## 2021-04-29 DIAGNOSIS — Z85850 Personal history of malignant neoplasm of thyroid: Secondary | ICD-10-CM | POA: Diagnosis not present

## 2021-04-29 DIAGNOSIS — I10 Essential (primary) hypertension: Secondary | ICD-10-CM | POA: Diagnosis not present

## 2021-04-29 DIAGNOSIS — M810 Age-related osteoporosis without current pathological fracture: Secondary | ICD-10-CM | POA: Diagnosis not present

## 2021-04-29 DIAGNOSIS — E039 Hypothyroidism, unspecified: Secondary | ICD-10-CM | POA: Diagnosis not present

## 2021-04-29 DIAGNOSIS — E890 Postprocedural hypothyroidism: Secondary | ICD-10-CM | POA: Diagnosis not present

## 2021-04-30 ENCOUNTER — Ambulatory Visit: Payer: Self-pay | Admitting: *Deleted

## 2021-05-07 ENCOUNTER — Other Ambulatory Visit: Payer: Self-pay | Admitting: *Deleted

## 2021-05-07 NOTE — Patient Outreach (Signed)
[place] [facility]) Care Management  [place]  05/07/2021   Jean Ruiz 11-28-1935 [id number]  Subjective: Successful telephone outreach call to patient. HIPAA identifiers obtained. Patient reports that she is doing well. She has completed her PT for balance and continues to do those exercises at home. She states this has been very beneficial and she also walks around her home which helps her to maintain her strength and mobility. Patient explains that she does receive Meals on Wheels. She continues to drive short distances and continues to stay involved with church, friends, and family. Patient did receive the education and calendar booklet this nurse sent her previously. She explained that her B/P monitor is not working, currently her weight is stable, and her CHF is well controlled. Nurse will send patient a B/P monitor. Patient did not have any further questions or concerns today and did confirm that she has this nurse's contact number to call her if needed.    Encounter Medications:  Outpatient Encounter Medications as of 05/07/2021  Medication Sig Note  . amLODipine (NORVASC) 5 MG tablet Take 5 mg by mouth daily.   Marland Kitchen aspirin 325 MG EC tablet Take 325 mg by mouth daily.    Marland Kitchen aspirin EC 81 MG tablet Take 81 mg by mouth daily. (Patient not taking: No sig reported) 02/26/2021: completed  . ciprofloxacin (CIPRO) 500 MG tablet Take 500 mg by mouth 2 (two) times daily. (Patient not taking: No sig reported) 02/26/2021: Reports not taking  . clopidogrel (PLAVIX) 75 MG tablet Take 75 mg by mouth daily. 02/26/2021: Not taking  . cyanocobalamin (,VITAMIN B-12,) 1000 MCG/ML injection INJECT ONE MILLILITER WEEKLY FOR FOUR WEEKS THEN, ONE MILLILITER EVERY MONTH 04/04/2016: Received from: External Pharmacy  . furosemide (LASIX) 40 MG tablet Take 40 mg by mouth daily.    . hydrALAZINE (APRESOLINE) 10 MG tablet Take 10 mg by mouth daily.    . hyoscyamine (LEVSIN SL) 0.125 MG SL tablet Place  under the tongue 4 (four) times daily.   . Levothyroxine Sodium (SYNTHROID PO) Take 125 mcg/day by mouth daily.  12/30/2019: Reports dose decreased to 100 mcg daily  . lisinopril (PRINIVIL,ZESTRIL) 40 MG tablet 40 mg daily.  04/04/2016: Received from: External Pharmacy  . LORazepam (ATIVAN) 0.5 MG tablet Take 0.5 mg by mouth daily as needed.  (Patient not taking: No sig reported) 02/26/2021: Not needed  . metoprolol succinate (TOPROL-XL) 25 MG 24 hr tablet Take 100 mg by mouth 2 (two) times daily.    Marland Kitchen ofloxacin (FLOXIN) 0.3 % OTIC solution 5 drops daily. (Patient not taking: No sig reported) 02/26/2021: Reports not taking  . potassium chloride SA (K-DUR,KLOR-CON) 20 MEQ tablet Take 20 mEq by mouth daily. 02/26/2021: 3-4 times a week   No facility-administered encounter medications on file as of 05/07/2021.    Functional Status:  No flowsheet data found.  Fall/Depression Screening: Fall Risk  05/07/2021 02/26/2021 08/11/2020  Falls in the past year? 1 1 0  Number falls in past yr: 1 1 0  Injury with Fall? 0 0 0  Risk for fall due to : Impaired mobility;Impaired balance/gait;History of fall(s) Impaired mobility;Impaired balance/gait;History of fall(s) Medication side effect;Impaired balance/gait;Impaired mobility;Impaired vision  Follow up Falls evaluation completed;Education provided;Falls prevention discussed Falls prevention discussed;Education provided;Falls evaluation completed -   PHQ 2/9 Scores 02/26/2021 08/11/2020 09/02/2019 03/18/2019 04/23/2018  PHQ - 2 Score 1 0 0 1 0    Assessment:   Care Plan There are no care plans that you  recently modified to display for this patient.   Goals Addressed            This Visit's Progress   . [facility]) Patient will verbalize monitoring her B/P  and recording her values weekly within the next 90 days       Timeframe:  Long-Range Goal Priority:  Medium Start Date:  02/26/21                           Expected End Date: 02/26/22                     Follow  Up Date 09/03/21    - check blood pressure weekly - write blood pressure results in a log or diary  -Encouraged limiting salt in diet -Encouraged continuation of routine exercise   Why is this important?    You won't feel high blood pressure, but it can still hurt your blood vessels.   High blood pressure can cause heart or kidney problems. It can also cause a stroke.   Making lifestyle changes like losing a little weight or eating less salt will help.   Checking your blood pressure at home and at different times of the day can help to control blood pressure.   If the doctor prescribes medicine remember to take it the way the doctor ordered.   Call the office if you cannot afford the medicine or if there are questions about it.     Notes: Nurse provided education about the importance of monitoring B/P and recording the values. Patient states that she would begin to monitor her B/P weekly and record the values. Nurse will send patient a calendar booklet to record her values.  05/07/21: Patient reports that she did receive the calendar booklet and explains that her B/P monitor is not working. Nurse will send patient a B/P monitor and encouraged the patient to take her B/P weekly and record the value in the calendar booklet.    Marland Kitchen [facility]) Patient will verbalize weighing and recording her values 3 times weekly within the next 90 days       Timeframe:  Long-Range Goal Priority:  Medium Start Date: 02/26/21                            Expected End Date: 03/03/22                     Follow Up Date 09/03/21    - develop a rescue plan - eat more whole grains, fruits and vegetables, lean meats and healthy fats - follow rescue plan if symptoms flare-up - track symptoms and what helps feel better or worse  -Discussed colored zones and rescue action plans for CHF -Discussed weighing 3 times weekly and monitoring for symptoms of CHF -Encouraged continuation of limiting the amount of salt in  diet  Why is this important?    You will be able to handle your symptoms better if you keep track of them.   Making some simple changes to your lifestyle will help.   Eating healthy is one thing you can do to take good care of yourself.    Notes: Nurse provided education about the importance of weighing and recording the values. Patient states that she would begin to weigh herself 3 times weekly and record the values. Nurse will send patient a calendar booklet to record her  values. Nurse will send CHF education.  05/07/21: Patient states that she did receive the calendar booklet with the heart failure color zones and rescue action plans. She reports that her heart failure is currently under control. Nurse encouraged patient to continue to weigh herself 3 times weekly and record her values in the calendar booklet.       Plan: RN Health Coach will send PCP a quarterly update, will send patient a B/P monitor, and will call patient within the month of September. Follow-up: Patient agrees to Care Plan and Follow-up.   [name] RN, BSN [place] [phone] [email or web address]

## 2021-05-07 NOTE — Patient Instructions (Addendum)
Goals Addressed            This Visit's Progress   . [facility]) Patient will verbalize monitoring her B/P  and recording her values weekly within the next 90 days       Timeframe:  Long-Range Goal Priority:  Medium Start Date:  02/26/21                           Expected End Date: 02/26/22                     Follow Up Date 09/03/21    - check blood pressure weekly - write blood pressure results in a log or diary  -Encouraged limiting salt in diet -Encouraged continuation of routine exercise   Why is this important?    You won't feel high blood pressure, but it can still hurt your blood vessels.   High blood pressure can cause heart or kidney problems. It can also cause a stroke.   Making lifestyle changes like losing a little weight or eating less salt will help.   Checking your blood pressure at home and at different times of the day can help to control blood pressure.   If the doctor prescribes medicine remember to take it the way the doctor ordered.   Call the office if you cannot afford the medicine or if there are questions about it.     Notes: Nurse provided education about the importance of monitoring B/P and recording the values. Patient states that she would begin to monitor her B/P weekly and record the values. Nurse will send patient a calendar booklet to record her values.  05/07/21: Patient reports that she did receive the calendar booklet and explains that her B/P monitor is not working. Nurse will send patient a B/P monitor and encouraged the patient to take her B/P weekly and record the value in the calendar booklet.    Marland Kitchen [facility]) Patient will verbalize weighing and recording her values 3 times weekly within the next 90 days       Timeframe:  Long-Range Goal Priority:  Medium Start Date: 02/26/21                            Expected End Date: 03/03/22                     Follow Up Date 09/03/21    - develop a rescue plan - eat more whole grains, fruits and vegetables,  lean meats and healthy fats - follow rescue plan if symptoms flare-up - track symptoms and what helps feel better or worse  -Discussed colored zones and rescue action plans for CHF -Discussed weighing 3 times weekly and monitoring for symptoms of CHF -Encouraged continuation of limiting the amount of salt in diet  Why is this important?    You will be able to handle your symptoms better if you keep track of them.   Making some simple changes to your lifestyle will help.   Eating healthy is one thing you can do to take good care of yourself.    Notes: Nurse provided education about the importance of weighing and recording the values. Patient states that she would begin to weigh herself 3 times weekly and record the values. Nurse will send patient a calendar booklet to record her values. Nurse will send CHF education.  05/07/21: Patient states  that she did receive the calendar booklet with the heart failure color zones and rescue action plans. She reports that her heart failure is currently under control. Nurse encouraged patient to continue to weigh herself 3 times weekly and record her values in the calendar booklet.        How to Take Your Blood Pressure Blood pressure measures how strongly your blood is pressing against the walls of your arteries. Arteries are blood vessels that carry blood from your heart throughout your body. You can take your blood pressure at home with a machine. You may need to check your blood pressure at home:  To check if you have high blood pressure (hypertension).  To check your blood pressure over time.  To make sure your blood pressure medicine is working. Supplies needed:  Blood pressure machine, or monitor.  Dining room chair to sit in.  Table or desk.  Small notebook.  Pencil or pen. How to prepare Avoid these things for 30 minutes before checking your blood pressure:  Having drinks with caffeine in them, such as coffee or tea.  Drinking  alcohol.  Eating.  Smoking.  Exercising. Do these things five minutes before checking your blood pressure:  Go to the bathroom and pee (urinate).  Sit in a dining chair. Do not sit in a soft couch or an armchair.  Be quiet. Do not talk. How to take your blood pressure Follow the instructions that came with your machine. If you have a digital blood pressure monitor, these may be the instructions: 1. Sit up straight. 2. Place your feet on the floor. Do not cross your ankles or legs. 3. Rest your left arm at the level of your heart. You may rest it on a table, desk, or chair. 4. Pull up your shirt sleeve. 5. Wrap the blood pressure cuff around the upper part of your left arm. The cuff should be 1 inch (2.5 cm) above your elbow. It is best to wrap the cuff around bare skin. 6. Fit the cuff snugly around your arm. You should be able to place only one finger between the cuff and your arm. 7. Place the cord so that it rests in the bend of your elbow. 8. Press the power button. 9. Sit quietly while the cuff fills with air and loses air. 10. Write down the numbers on the screen. 11. Wait 2-3 minutes and then repeat steps 1-10.   What do the numbers mean? Two numbers make up your blood pressure. The first number is called systolic pressure. The second is called diastolic pressure. An example of a blood pressure reading is "120 over 80" (or 120/80). If you are an adult and do not have a medical condition, use this guide to find out if your blood pressure is normal: Normal  First number: below 120.  Second number: below 80. Elevated  First number: 120-129.  Second number: below 80. Hypertension stage 1  First number: 130-139.  Second number: 80-89. Hypertension stage 2  First number: 140 or above.  Second number: 63 or above. Your blood pressure is above normal even if only the top or bottom number is above normal. Follow these instructions at home:  Check your blood pressure  as often as your doctor tells you to.  Check your blood pressure at the same time every day.  Take your monitor to your next doctor's appointment. Your doctor will: ? Make sure you are using it correctly. ? Make sure it is working  right.  Make sure you understand what your blood pressure numbers should be.  Tell your doctor if your medicine is causing side effects.  Keep all follow-up visits as told by your doctor. This is important. General tips:  You will need a blood pressure machine, or monitor. Your doctor can suggest a monitor. You can buy one at a drugstore or online. When choosing one: ? Choose one with an arm cuff. ? Choose one that wraps around your upper arm. Only one finger should fit between your arm and the cuff. ? Do not choose one that measures your blood pressure from your wrist or finger. Where to find more information American Heart Association: www.heart.org Contact a doctor if:  Your blood pressure keeps being high. Get help right away if:  Your first blood pressure number is higher than 180.  Your second blood pressure number is higher than 120. Summary  Check your blood pressure at the same time every day.  Avoid caffeine, alcohol, smoking, and exercise for 30 minutes before checking your blood pressure.  Make sure you understand what your blood pressure numbers should be. This information is not intended to replace advice given to you by your health care provider. Make sure you discuss any questions you have with your health care provider. Document Revised: 11/15/2019 Document Reviewed: 11/15/2019 Elsevier Patient Education  2021 [employer].

## 2021-05-10 DIAGNOSIS — D519 Vitamin B12 deficiency anemia, unspecified: Secondary | ICD-10-CM | POA: Diagnosis not present

## 2021-05-24 DIAGNOSIS — N6324 Unspecified lump in the left breast, lower inner quadrant: Secondary | ICD-10-CM | POA: Diagnosis not present

## 2021-05-24 DIAGNOSIS — R928 Other abnormal and inconclusive findings on diagnostic imaging of breast: Secondary | ICD-10-CM | POA: Diagnosis not present

## 2021-06-02 DIAGNOSIS — I10 Essential (primary) hypertension: Secondary | ICD-10-CM | POA: Diagnosis not present

## 2021-06-02 DIAGNOSIS — I4891 Unspecified atrial fibrillation: Secondary | ICD-10-CM | POA: Diagnosis not present

## 2021-06-02 DIAGNOSIS — K580 Irritable bowel syndrome with diarrhea: Secondary | ICD-10-CM | POA: Diagnosis not present

## 2021-06-02 DIAGNOSIS — I6990 Unspecified sequelae of unspecified cerebrovascular disease: Secondary | ICD-10-CM | POA: Diagnosis not present

## 2021-06-02 DIAGNOSIS — E890 Postprocedural hypothyroidism: Secondary | ICD-10-CM | POA: Diagnosis not present

## 2021-06-02 DIAGNOSIS — D519 Vitamin B12 deficiency anemia, unspecified: Secondary | ICD-10-CM | POA: Diagnosis not present

## 2021-06-02 DIAGNOSIS — Z6826 Body mass index (BMI) 26.0-26.9, adult: Secondary | ICD-10-CM | POA: Diagnosis not present

## 2021-06-02 DIAGNOSIS — E785 Hyperlipidemia, unspecified: Secondary | ICD-10-CM | POA: Diagnosis not present

## 2021-06-11 DIAGNOSIS — D519 Vitamin B12 deficiency anemia, unspecified: Secondary | ICD-10-CM | POA: Diagnosis not present

## 2021-07-13 DIAGNOSIS — D519 Vitamin B12 deficiency anemia, unspecified: Secondary | ICD-10-CM | POA: Diagnosis not present

## 2021-07-19 DIAGNOSIS — M810 Age-related osteoporosis without current pathological fracture: Secondary | ICD-10-CM | POA: Diagnosis not present

## 2021-07-19 DIAGNOSIS — J309 Allergic rhinitis, unspecified: Secondary | ICD-10-CM | POA: Diagnosis not present

## 2021-07-30 DIAGNOSIS — M810 Age-related osteoporosis without current pathological fracture: Secondary | ICD-10-CM | POA: Diagnosis not present

## 2021-08-11 ENCOUNTER — Other Ambulatory Visit: Payer: Self-pay | Admitting: *Deleted

## 2021-08-11 NOTE — Patient Outreach (Signed)
[name] [facility]) Care Management  08/11/2021  Jean Ruiz [birth year] [id number]  Unsuccessful outreach attempt made to patient. RN Health Coach left HIPAA compliant voicemail message along with her contact information.  Plan: RN Health Coach will call patient within the month of October.  [name] RN, BSN [place] [phone] [name]

## 2021-08-13 DIAGNOSIS — D519 Vitamin B12 deficiency anemia, unspecified: Secondary | ICD-10-CM | POA: Diagnosis not present

## 2021-08-19 DIAGNOSIS — N6320 Unspecified lump in the left breast, unspecified quadrant: Secondary | ICD-10-CM | POA: Diagnosis not present

## 2021-08-19 DIAGNOSIS — E890 Postprocedural hypothyroidism: Secondary | ICD-10-CM | POA: Diagnosis not present

## 2021-08-19 DIAGNOSIS — I4891 Unspecified atrial fibrillation: Secondary | ICD-10-CM | POA: Diagnosis not present

## 2021-08-19 DIAGNOSIS — G8191 Hemiplegia, unspecified affecting right dominant side: Secondary | ICD-10-CM | POA: Diagnosis not present

## 2021-08-19 DIAGNOSIS — K580 Irritable bowel syndrome with diarrhea: Secondary | ICD-10-CM | POA: Diagnosis not present

## 2021-08-19 DIAGNOSIS — D519 Vitamin B12 deficiency anemia, unspecified: Secondary | ICD-10-CM | POA: Diagnosis not present

## 2021-08-19 DIAGNOSIS — I10 Essential (primary) hypertension: Secondary | ICD-10-CM | POA: Diagnosis not present

## 2021-08-19 DIAGNOSIS — Z1231 Encounter for screening mammogram for malignant neoplasm of breast: Secondary | ICD-10-CM | POA: Diagnosis not present

## 2021-08-19 DIAGNOSIS — E785 Hyperlipidemia, unspecified: Secondary | ICD-10-CM | POA: Diagnosis not present

## 2021-08-26 DIAGNOSIS — E559 Vitamin D deficiency, unspecified: Secondary | ICD-10-CM | POA: Diagnosis not present

## 2021-08-26 DIAGNOSIS — E039 Hypothyroidism, unspecified: Secondary | ICD-10-CM | POA: Diagnosis not present

## 2021-09-02 DIAGNOSIS — E039 Hypothyroidism, unspecified: Secondary | ICD-10-CM | POA: Diagnosis not present

## 2021-09-02 DIAGNOSIS — E890 Postprocedural hypothyroidism: Secondary | ICD-10-CM | POA: Diagnosis not present

## 2021-09-02 DIAGNOSIS — M810 Age-related osteoporosis without current pathological fracture: Secondary | ICD-10-CM | POA: Diagnosis not present

## 2021-09-02 DIAGNOSIS — I10 Essential (primary) hypertension: Secondary | ICD-10-CM | POA: Diagnosis not present

## 2021-09-02 DIAGNOSIS — Z85850 Personal history of malignant neoplasm of thyroid: Secondary | ICD-10-CM | POA: Diagnosis not present

## 2021-09-02 DIAGNOSIS — Z6825 Body mass index (BMI) 25.0-25.9, adult: Secondary | ICD-10-CM | POA: Diagnosis not present

## 2021-09-10 ENCOUNTER — Other Ambulatory Visit: Payer: Self-pay | Admitting: *Deleted

## 2021-09-10 NOTE — Patient Outreach (Signed)
[facility]) Care Management  [place]  09/10/2021   Jean Ruiz 03-14-1935 [id number]  Subjective: Successful telephone outreach call to patient. HIPAA identifiers obtained. Patient states she is dong well at this time. She reports continuation of PT exercises for balance at home, denies any recent falls or needs for DME, and explains that she is well supported by her family, church, and friends. Nurse discussed with patient her health goals and her health and wellness needs which were documented in the Epic system. Patient did not have any further questions or concerns today and did confirm that she has this nurse's contact number to call her if needed.  Encounter Medications:  Outpatient Encounter Medications as of 09/10/2021  Medication Sig Note   amLODipine (NORVASC) 5 MG tablet Take 5 mg by mouth daily.    aspirin 325 MG EC tablet Take 325 mg by mouth daily.     aspirin EC 81 MG tablet Take 81 mg by mouth daily. (Patient not taking: No sig reported) 02/26/2021: completed   ciprofloxacin (CIPRO) 500 MG tablet Take 500 mg by mouth 2 (two) times daily. (Patient not taking: No sig reported) 02/26/2021: Reports not taking   clopidogrel (PLAVIX) 75 MG tablet Take 75 mg by mouth daily. 02/26/2021: Not taking   cyanocobalamin (,VITAMIN B-12,) 1000 MCG/ML injection INJECT ONE MILLILITER WEEKLY FOR FOUR WEEKS THEN, ONE MILLILITER EVERY MONTH 04/04/2016: Received from: External Pharmacy   furosemide (LASIX) 40 MG tablet Take 40 mg by mouth daily.     hydrALAZINE (APRESOLINE) 10 MG tablet Take 10 mg by mouth daily.     hyoscyamine (LEVSIN SL) 0.125 MG SL tablet Place under the tongue 4 (four) times daily.    Levothyroxine Sodium (SYNTHROID PO) Take 125 mcg/day by mouth daily.  12/30/2019: Reports dose decreased to 100 mcg daily   lisinopril (PRINIVIL,ZESTRIL) 40 MG tablet 40 mg daily.  04/04/2016: Received from: External Pharmacy   LORazepam (ATIVAN) 0.5 MG tablet Take 0.5 mg  by mouth daily as needed.  (Patient not taking: No sig reported) 02/26/2021: Not needed   metoprolol succinate (TOPROL-XL) 25 MG 24 hr tablet Take 100 mg by mouth 2 (two) times daily.     ofloxacin (FLOXIN) 0.3 % OTIC solution 5 drops daily. (Patient not taking: No sig reported) 02/26/2021: Reports not taking   potassium chloride SA (K-DUR,KLOR-CON) 20 MEQ tablet Take 20 mEq by mouth daily. 02/26/2021: 3-4 times a week   No facility-administered encounter medications on file as of 09/10/2021.    Functional Status:  No flowsheet data found.  Fall/Depression Screening: Fall Risk  09/10/2021 05/07/2021 02/26/2021  Falls in the past year? 1 1 1   Number falls in past yr: 0 1 1  Injury with Fall? 0 0 0  Risk for fall due to : Impaired mobility;Impaired balance/gait;History of fall(s) Impaired mobility;Impaired balance/gait;History of fall(s) Impaired mobility;Impaired balance/gait;History of fall(s)  Follow up Falls prevention discussed;Education provided;Falls evaluation completed Falls evaluation completed;Education provided;Falls prevention discussed Falls prevention discussed;Education provided;Falls evaluation completed   PHQ 2/9 Scores 02/26/2021 08/11/2020 09/02/2019 03/18/2019 04/23/2018  PHQ - 2 Score 1 0 0 1 0    Assessment:   Care Plan There are no care plans that you recently modified to display for this patient.    Goals Addressed             This Visit's Progress    [facility]) Patient will verbalize monitoring her B/P  and recording her values weekly within the next 90 days  Timeframe:  Long-Range Goal Priority:  Medium Start Date:  02/26/21                           Expected End Date: 02/26/22                     Follow Up Date 12/03/21    - check blood pressure weekly - write blood pressure results in a log or diary  -Encouraged limiting salt in diet -Encouraged continuation of routine exercise -Encouraged patient to take her blood pressure weekly and record the value in the  calendar booklet   Why is this important?   You won't feel high blood pressure, but it can still hurt your blood vessels.  High blood pressure can cause heart or kidney problems. It can also cause a stroke.  Making lifestyle changes like losing a little weight or eating less salt will help.  Checking your blood pressure at home and at different times of the day can help to control blood pressure.  If the doctor prescribes medicine remember to take it the way the doctor ordered.  Call the office if you cannot afford the medicine or if there are questions about it.     Notes: Updated 09/10/21: Patient states she received the B/P monitor and calendar booklet. Patient explains that she takes her B/P occasionally. Nurse encouraged patient to take her B/P weekly and record the value.   05/07/21: Patient reports that she did receive the calendar booklet and explains that her B/P monitor is not working. Nurse will send patient a B/P monitor and encouraged the patient to take her B/P weekly and record the value in the calendar booklet.  02/26/21: Nurse provided education about the importance of monitoring B/P and recording the values. Patient states that she would begin to monitor her B/P weekly and record the values. Nurse will send patient a calendar booklet to record her values.     [facility]) Patient will verbalize weighing and recording her values 1-2 times weekly within the next 90 days   On track    Timeframe:  Long-Range Goal Priority:  Medium Start Date: 02/26/21                            Expected End Date: 03/03/22                     Follow Up Date 12/03/21    - develop a rescue plan - eat more whole grains, fruits and vegetables, lean meats and healthy fats - follow rescue plan if symptoms flare-up - track symptoms and what helps feel better or worse  -Discussed colored zones and rescue action plans for CHF that are located in calendar booklet -Discussed weighing 3 times weekly and monitoring  for symptoms of CHF -Encouraged continuation of limiting the amount of salt in diet  Why is this important?   You will be able to handle your symptoms better if you keep track of them.  Making some simple changes to your lifestyle will help.  Eating healthy is one thing you can do to take good care of yourself.    Notes: 09/10/21: Patient states she did receive the calendar booklet and she is weighing 1-2 times weekly and recording her values in the calendar booklet.  05/07/21: Patient states that she did receive the calendar booklet with the heart failure color zones  and rescue action plans. She reports that her heart failure is currently under control. Nurse encouraged patient to continue to weigh herself 3 times weekly and record her values in the calendar booklet.   02/26/21: Nurse provided education about the importance of weighing and recording the values. Patient states that she would begin to weigh herself 3 times weekly and record the values. Nurse will send patient a calendar booklet to record her values. Nurse will send CHF education.       Plan: RN Health Coach will send PCP today's assessment note and will call patient within the month of December. Follow-up: Patient agrees to Care Plan and Follow-up.   [name] RN, BSN [place] [phone] [email or web address]

## 2021-09-10 NOTE — Patient Instructions (Signed)
Goals Addressed             This Visit's Progress    [facility]) Patient will verbalize monitoring her B/P  and recording her values weekly within the next 90 days       Timeframe:  Long-Range Goal Priority:  Medium Start Date:  02/26/21                           Expected End Date: 02/26/22                     Follow Up Date 12/03/21    - check blood pressure weekly - write blood pressure results in a log or diary  -Encouraged limiting salt in diet -Encouraged continuation of routine exercise -Encouraged patient to take her blood pressure weekly and record the value in the calendar booklet   Why is this important?   You won't feel high blood pressure, but it can still hurt your blood vessels.  High blood pressure can cause heart or kidney problems. It can also cause a stroke.  Making lifestyle changes like losing a little weight or eating less salt will help.  Checking your blood pressure at home and at different times of the day can help to control blood pressure.  If the doctor prescribes medicine remember to take it the way the doctor ordered.  Call the office if you cannot afford the medicine or if there are questions about it.     Notes: Updated 09/10/21: Patient states she received the B/P monitor and calendar booklet. Patient explains that she takes her B/P occasionally. Nurse encouraged patient to take her B/P weekly and record the value.   05/07/21: Patient reports that she did receive the calendar booklet and explains that her B/P monitor is not working. Nurse will send patient a B/P monitor and encouraged the patient to take her B/P weekly and record the value in the calendar booklet.  02/26/21: Nurse provided education about the importance of monitoring B/P and recording the values. Patient states that she would begin to monitor her B/P weekly and record the values. Nurse will send patient a calendar booklet to record her values.     [facility]) Patient will verbalize weighing and  recording her values 1-2 times weekly within the next 90 days   On track    Timeframe:  Long-Range Goal Priority:  Medium Start Date: 02/26/21                            Expected End Date: 03/03/22                     Follow Up Date 12/03/21    - develop a rescue plan - eat more whole grains, fruits and vegetables, lean meats and healthy fats - follow rescue plan if symptoms flare-up - track symptoms and what helps feel better or worse  -Discussed colored zones and rescue action plans for CHF that are located in calendar booklet -Discussed weighing 3 times weekly and monitoring for symptoms of CHF -Encouraged continuation of limiting the amount of salt in diet  Why is this important?   You will be able to handle your symptoms better if you keep track of them.  Making some simple changes to your lifestyle will help.  Eating healthy is one thing you can do to take good care of yourself.  Notes: 09/10/21: Patient states she did receive the calendar booklet and she is weighing 1-2 times weekly and recording her values in the calendar booklet.  05/07/21: Patient states that she did receive the calendar booklet with the heart failure color zones and rescue action plans. She reports that her heart failure is currently under control. Nurse encouraged patient to continue to weigh herself 3 times weekly and record her values in the calendar booklet.   02/26/21: Nurse provided education about the importance of weighing and recording the values. Patient states that she would begin to weigh herself 3 times weekly and record the values. Nurse will send patient a calendar booklet to record her values. Nurse will send CHF education.

## 2021-09-13 DIAGNOSIS — D519 Vitamin B12 deficiency anemia, unspecified: Secondary | ICD-10-CM | POA: Diagnosis not present

## 2021-09-27 DIAGNOSIS — Z20828 Contact with and (suspected) exposure to other viral communicable diseases: Secondary | ICD-10-CM | POA: Diagnosis not present

## 2021-09-27 DIAGNOSIS — J069 Acute upper respiratory infection, unspecified: Secondary | ICD-10-CM | POA: Diagnosis not present

## 2021-10-08 DIAGNOSIS — S51812A Laceration without foreign body of left forearm, initial encounter: Secondary | ICD-10-CM | POA: Diagnosis not present

## 2021-10-09 DIAGNOSIS — S51802A Unspecified open wound of left forearm, initial encounter: Secondary | ICD-10-CM | POA: Diagnosis not present

## 2021-10-09 DIAGNOSIS — G8191 Hemiplegia, unspecified affecting right dominant side: Secondary | ICD-10-CM | POA: Diagnosis not present

## 2021-10-09 DIAGNOSIS — I4891 Unspecified atrial fibrillation: Secondary | ICD-10-CM | POA: Diagnosis not present

## 2021-10-14 DIAGNOSIS — D519 Vitamin B12 deficiency anemia, unspecified: Secondary | ICD-10-CM | POA: Diagnosis not present

## 2021-10-21 DIAGNOSIS — S51802A Unspecified open wound of left forearm, initial encounter: Secondary | ICD-10-CM | POA: Diagnosis not present

## 2021-10-26 DIAGNOSIS — Z1331 Encounter for screening for depression: Secondary | ICD-10-CM | POA: Diagnosis not present

## 2021-10-26 DIAGNOSIS — Z9181 History of falling: Secondary | ICD-10-CM | POA: Diagnosis not present

## 2021-10-26 DIAGNOSIS — Z0000 Encounter for general adult medical examination without abnormal findings: Secondary | ICD-10-CM | POA: Diagnosis not present

## 2021-10-26 DIAGNOSIS — E785 Hyperlipidemia, unspecified: Secondary | ICD-10-CM | POA: Diagnosis not present

## 2021-11-24 ENCOUNTER — Other Ambulatory Visit: Payer: Self-pay | Admitting: *Deleted

## 2021-11-24 NOTE — Patient Outreach (Signed)
Jean Ruiz) Care Management  11/24/2021  Jean Ruiz 10-Feb-1935 [id number]  Unsuccessful outreach attempt made to patient. Patient answered the phone and stated that she would not be able to speak today. She did request that this nurse call back at a later date.   Plan: RN Health Coach will call patient within the month of January.  [name] RN, BSN [place] [phone] [email or web address]

## 2021-12-02 DIAGNOSIS — I10 Essential (primary) hypertension: Secondary | ICD-10-CM | POA: Diagnosis not present

## 2021-12-02 DIAGNOSIS — G8191 Hemiplegia, unspecified affecting right dominant side: Secondary | ICD-10-CM | POA: Diagnosis not present

## 2021-12-02 DIAGNOSIS — I4891 Unspecified atrial fibrillation: Secondary | ICD-10-CM | POA: Diagnosis not present

## 2021-12-02 DIAGNOSIS — E785 Hyperlipidemia, unspecified: Secondary | ICD-10-CM | POA: Diagnosis not present

## 2021-12-02 DIAGNOSIS — E890 Postprocedural hypothyroidism: Secondary | ICD-10-CM | POA: Diagnosis not present

## 2021-12-02 DIAGNOSIS — K580 Irritable bowel syndrome with diarrhea: Secondary | ICD-10-CM | POA: Diagnosis not present

## 2021-12-02 DIAGNOSIS — Z139 Encounter for screening, unspecified: Secondary | ICD-10-CM | POA: Diagnosis not present

## 2021-12-02 DIAGNOSIS — D519 Vitamin B12 deficiency anemia, unspecified: Secondary | ICD-10-CM | POA: Diagnosis not present

## 2021-12-21 DIAGNOSIS — M25552 Pain in left hip: Secondary | ICD-10-CM | POA: Diagnosis not present

## 2021-12-21 DIAGNOSIS — R519 Headache, unspecified: Secondary | ICD-10-CM | POA: Diagnosis not present

## 2021-12-21 DIAGNOSIS — M1612 Unilateral primary osteoarthritis, left hip: Secondary | ICD-10-CM | POA: Diagnosis not present

## 2021-12-21 DIAGNOSIS — G319 Degenerative disease of nervous system, unspecified: Secondary | ICD-10-CM | POA: Diagnosis not present

## 2021-12-21 DIAGNOSIS — Z8673 Personal history of transient ischemic attack (TIA), and cerebral infarction without residual deficits: Secondary | ICD-10-CM | POA: Diagnosis not present

## 2021-12-21 DIAGNOSIS — G8191 Hemiplegia, unspecified affecting right dominant side: Secondary | ICD-10-CM | POA: Diagnosis not present

## 2021-12-21 DIAGNOSIS — R296 Repeated falls: Secondary | ICD-10-CM | POA: Diagnosis not present

## 2021-12-22 ENCOUNTER — Other Ambulatory Visit: Payer: Self-pay | Admitting: *Deleted

## 2021-12-22 NOTE — Patient Outreach (Addendum)
[facility]) Care Management  12/22/2021  JARELIS EHLERT 01/19/1935 [id number]  Unsuccessful outreach attempt made to patient. RN Health Coach left HIPAA compliant voicemail message along with her contact information.  Plan: RN Health Coach will call patient within the month of February.  [name] RN, BSN [place] [phone] [email or web address]

## 2022-01-03 DIAGNOSIS — D519 Vitamin B12 deficiency anemia, unspecified: Secondary | ICD-10-CM | POA: Diagnosis not present

## 2022-01-18 DIAGNOSIS — Z20822 Contact with and (suspected) exposure to covid-19: Secondary | ICD-10-CM | POA: Diagnosis not present

## 2022-01-27 ENCOUNTER — Other Ambulatory Visit: Payer: Self-pay | Admitting: *Deleted

## 2022-01-27 NOTE — Patient Outreach (Signed)
[facility]) Care Management  01/27/2022  LOTTIE SIGMAN Sep 06, 1935 [id number]  Unsuccessful outreach attempt made to patient. Patient answered the phone and stated that she would not be able to speak today. Patient did request that this nurse call back at a later date.   Plan: RN Health Coach will call patient within the month of March.  [name] RN, BSN [place] [phone] [email or web address]

## 2022-01-28 DIAGNOSIS — N959 Unspecified menopausal and perimenopausal disorder: Secondary | ICD-10-CM | POA: Diagnosis not present

## 2022-01-28 DIAGNOSIS — M810 Age-related osteoporosis without current pathological fracture: Secondary | ICD-10-CM | POA: Diagnosis not present

## 2022-01-28 DIAGNOSIS — M85852 Other specified disorders of bone density and structure, left thigh: Secondary | ICD-10-CM | POA: Diagnosis not present

## 2022-01-30 DIAGNOSIS — Z20822 Contact with and (suspected) exposure to covid-19: Secondary | ICD-10-CM | POA: Diagnosis not present

## 2022-02-02 DIAGNOSIS — L820 Inflamed seborrheic keratosis: Secondary | ICD-10-CM | POA: Diagnosis not present

## 2022-02-09 DIAGNOSIS — M4316 Spondylolisthesis, lumbar region: Secondary | ICD-10-CM | POA: Diagnosis not present

## 2022-02-09 DIAGNOSIS — M160 Bilateral primary osteoarthritis of hip: Secondary | ICD-10-CM | POA: Diagnosis not present

## 2022-02-09 DIAGNOSIS — N390 Urinary tract infection, site not specified: Secondary | ICD-10-CM | POA: Diagnosis not present

## 2022-02-09 DIAGNOSIS — Z7982 Long term (current) use of aspirin: Secondary | ICD-10-CM | POA: Diagnosis not present

## 2022-02-09 DIAGNOSIS — R531 Weakness: Secondary | ICD-10-CM | POA: Diagnosis not present

## 2022-02-09 DIAGNOSIS — Z7902 Long term (current) use of antithrombotics/antiplatelets: Secondary | ICD-10-CM | POA: Diagnosis not present

## 2022-02-09 DIAGNOSIS — S32009A Unspecified fracture of unspecified lumbar vertebra, initial encounter for closed fracture: Secondary | ICD-10-CM | POA: Diagnosis not present

## 2022-02-09 DIAGNOSIS — Z20822 Contact with and (suspected) exposure to covid-19: Secondary | ICD-10-CM | POA: Diagnosis not present

## 2022-02-09 DIAGNOSIS — S12100A Unspecified displaced fracture of second cervical vertebra, initial encounter for closed fracture: Secondary | ICD-10-CM | POA: Diagnosis not present

## 2022-02-09 DIAGNOSIS — Z79899 Other long term (current) drug therapy: Secondary | ICD-10-CM | POA: Diagnosis not present

## 2022-02-09 DIAGNOSIS — R5383 Other fatigue: Secondary | ICD-10-CM | POA: Diagnosis not present

## 2022-02-09 DIAGNOSIS — M5127 Other intervertebral disc displacement, lumbosacral region: Secondary | ICD-10-CM | POA: Diagnosis not present

## 2022-02-09 DIAGNOSIS — E876 Hypokalemia: Secondary | ICD-10-CM | POA: Diagnosis not present

## 2022-02-09 DIAGNOSIS — J984 Other disorders of lung: Secondary | ICD-10-CM | POA: Diagnosis not present

## 2022-02-09 DIAGNOSIS — M47816 Spondylosis without myelopathy or radiculopathy, lumbar region: Secondary | ICD-10-CM | POA: Diagnosis not present

## 2022-02-09 DIAGNOSIS — I10 Essential (primary) hypertension: Secondary | ICD-10-CM | POA: Diagnosis not present

## 2022-02-09 DIAGNOSIS — E871 Hypo-osmolality and hyponatremia: Secondary | ICD-10-CM | POA: Diagnosis not present

## 2022-02-09 DIAGNOSIS — I672 Cerebral atherosclerosis: Secondary | ICD-10-CM | POA: Diagnosis not present

## 2022-02-09 DIAGNOSIS — R296 Repeated falls: Secondary | ICD-10-CM | POA: Diagnosis not present

## 2022-02-09 DIAGNOSIS — W19XXXA Unspecified fall, initial encounter: Secondary | ICD-10-CM | POA: Diagnosis not present

## 2022-02-09 DIAGNOSIS — Z043 Encounter for examination and observation following other accident: Secondary | ICD-10-CM | POA: Diagnosis not present

## 2022-02-10 DIAGNOSIS — R531 Weakness: Secondary | ICD-10-CM | POA: Diagnosis not present

## 2022-02-10 DIAGNOSIS — N390 Urinary tract infection, site not specified: Secondary | ICD-10-CM | POA: Diagnosis not present

## 2022-02-10 DIAGNOSIS — R296 Repeated falls: Secondary | ICD-10-CM | POA: Diagnosis not present

## 2022-02-11 DIAGNOSIS — M6281 Muscle weakness (generalized): Secondary | ICD-10-CM | POA: Diagnosis not present

## 2022-02-11 DIAGNOSIS — R2681 Unsteadiness on feet: Secondary | ICD-10-CM | POA: Diagnosis not present

## 2022-02-11 DIAGNOSIS — M810 Age-related osteoporosis without current pathological fracture: Secondary | ICD-10-CM | POA: Diagnosis not present

## 2022-02-11 DIAGNOSIS — E46 Unspecified protein-calorie malnutrition: Secondary | ICD-10-CM | POA: Diagnosis not present

## 2022-02-11 DIAGNOSIS — I739 Peripheral vascular disease, unspecified: Secondary | ICD-10-CM | POA: Diagnosis not present

## 2022-02-11 DIAGNOSIS — E876 Hypokalemia: Secondary | ICD-10-CM | POA: Diagnosis not present

## 2022-02-11 DIAGNOSIS — Z20822 Contact with and (suspected) exposure to covid-19: Secondary | ICD-10-CM | POA: Diagnosis not present

## 2022-02-11 DIAGNOSIS — D649 Anemia, unspecified: Secondary | ICD-10-CM | POA: Diagnosis not present

## 2022-02-11 DIAGNOSIS — I69351 Hemiplegia and hemiparesis following cerebral infarction affecting right dominant side: Secondary | ICD-10-CM | POA: Diagnosis not present

## 2022-02-11 DIAGNOSIS — I4819 Other persistent atrial fibrillation: Secondary | ICD-10-CM | POA: Diagnosis not present

## 2022-02-11 DIAGNOSIS — Z7401 Bed confinement status: Secondary | ICD-10-CM | POA: Diagnosis not present

## 2022-02-11 DIAGNOSIS — R279 Unspecified lack of coordination: Secondary | ICD-10-CM | POA: Diagnosis not present

## 2022-02-11 DIAGNOSIS — R531 Weakness: Secondary | ICD-10-CM | POA: Diagnosis not present

## 2022-02-11 DIAGNOSIS — R296 Repeated falls: Secondary | ICD-10-CM | POA: Diagnosis not present

## 2022-02-11 DIAGNOSIS — F4320 Adjustment disorder, unspecified: Secondary | ICD-10-CM | POA: Diagnosis not present

## 2022-02-11 DIAGNOSIS — I2510 Atherosclerotic heart disease of native coronary artery without angina pectoris: Secondary | ICD-10-CM | POA: Diagnosis not present

## 2022-02-11 DIAGNOSIS — I10 Essential (primary) hypertension: Secondary | ICD-10-CM | POA: Diagnosis not present

## 2022-02-11 DIAGNOSIS — I119 Hypertensive heart disease without heart failure: Secondary | ICD-10-CM | POA: Diagnosis not present

## 2022-02-11 DIAGNOSIS — E039 Hypothyroidism, unspecified: Secondary | ICD-10-CM | POA: Diagnosis not present

## 2022-02-11 DIAGNOSIS — N390 Urinary tract infection, site not specified: Secondary | ICD-10-CM | POA: Diagnosis not present

## 2022-02-11 DIAGNOSIS — R262 Difficulty in walking, not elsewhere classified: Secondary | ICD-10-CM | POA: Diagnosis not present

## 2022-02-11 DIAGNOSIS — F5101 Primary insomnia: Secondary | ICD-10-CM | POA: Diagnosis not present

## 2022-02-11 DIAGNOSIS — E871 Hypo-osmolality and hyponatremia: Secondary | ICD-10-CM | POA: Diagnosis not present

## 2022-02-11 DIAGNOSIS — I5022 Chronic systolic (congestive) heart failure: Secondary | ICD-10-CM | POA: Diagnosis not present

## 2022-02-15 ENCOUNTER — Other Ambulatory Visit: Payer: Self-pay | Admitting: *Deleted

## 2022-02-15 DIAGNOSIS — D649 Anemia, unspecified: Secondary | ICD-10-CM | POA: Diagnosis not present

## 2022-02-15 DIAGNOSIS — N390 Urinary tract infection, site not specified: Secondary | ICD-10-CM | POA: Diagnosis not present

## 2022-02-15 DIAGNOSIS — I119 Hypertensive heart disease without heart failure: Secondary | ICD-10-CM | POA: Diagnosis not present

## 2022-02-15 DIAGNOSIS — R262 Difficulty in walking, not elsewhere classified: Secondary | ICD-10-CM | POA: Diagnosis not present

## 2022-02-15 NOTE — Patient Outreach (Signed)
Per [place] eligible member resides in [place] [facility].  Mrs. Quigley is active with [place].  ? ?Mrs. Portier admitted to SNF on 02/11/22 after hospitalization. ? ?Update received from [place], [place] SW indicating transition plan is to return home. Mrs. Depaolis lives alone and family lives nearby to assist as needed.  ? ?Will continue to follow for potential [facility] needs and transition plans while member resides in SNF. ? ?Will send update to [place] and plan to refer to community [facility] Care Management RNCM upon SNF discharge.  ? ? ? ?Marthenia Rolling, MSN, RN,BSN ?[place] Coordinator ?[phone] [facility]) ?[phone]  (Toll free office)   ?

## 2022-02-16 ENCOUNTER — Other Ambulatory Visit: Payer: Self-pay | Admitting: *Deleted

## 2022-02-16 DIAGNOSIS — Z20822 Contact with and (suspected) exposure to covid-19: Secondary | ICD-10-CM | POA: Diagnosis not present

## 2022-02-16 NOTE — Patient Outreach (Signed)
[facility]) Care Management ? ?02/16/2022 ? ?Jean Ruiz ?10-01-1935 ?[id number] ? ?Patient is currently admitted to [facility] in [place]. Nurse Health Coach will perform case closure and transfer patient to the [facility] CM Team. [facility] SNF Liaison Coordinator informed this nurse of the patient's admission.  ?  ?Plan: ?RN Health Coach Discipline Closure ?Discipline Closure letter sent to PCP  ? ?Jean Loron RN, BSN ?Nurse Health Coach ?[place] ?[phone] ?[email or web address] ? ? ?

## 2022-02-17 DIAGNOSIS — F4320 Adjustment disorder, unspecified: Secondary | ICD-10-CM | POA: Diagnosis not present

## 2022-02-17 DIAGNOSIS — F5101 Primary insomnia: Secondary | ICD-10-CM | POA: Diagnosis not present

## 2022-02-18 ENCOUNTER — Other Ambulatory Visit: Payer: Self-pay | Admitting: *Deleted

## 2022-02-18 NOTE — Patient Outreach (Signed)
[place] Coordinator follow up. THN eligible member screened for potential [facility] Care Management services as a benefit of member's insurance plan. Mrs. Salley currently resides in [facility]. Mrs. Daudelin was active with [facility] prior to admission.  ? ?Telephone call made to Mrs. Carsey on facility telephone. Made Mrs. Bloodsworth aware that Probation officer is following for transition plans and needs while she remains in SNF. Discussed Probation officer will make referral to [place] post [facility]F and [place] will follow thereafter. Mrs. Manera is agreeable. ? ?Will continue to follow while Mrs. Czerniak remains in [place], [place]. Will make referral to [place] upon SNF discharge. ? ? ?Marthenia Rolling, MSN, RN,BSN ?[place] Coordinator ?[phone] [employer]) ?[phone]  (Toll free office)   ?

## 2022-02-24 DIAGNOSIS — Z20822 Contact with and (suspected) exposure to covid-19: Secondary | ICD-10-CM | POA: Diagnosis not present

## 2022-02-28 DIAGNOSIS — Z20822 Contact with and (suspected) exposure to covid-19: Secondary | ICD-10-CM | POA: Diagnosis not present

## 2022-03-01 ENCOUNTER — Other Ambulatory Visit: Payer: Self-pay | Admitting: *Deleted

## 2022-03-01 DIAGNOSIS — I5032 Chronic diastolic (congestive) heart failure: Secondary | ICD-10-CM

## 2022-03-01 NOTE — Patient Outreach (Signed)
[facility] Post- Acute Care Coordinator follow up. Jean Ruiz resides in [place].  ? ?Update received from [place], [place] indicating Jean Ruiz will transition to home on 03/04/22 with home health thru [facility] for PT/OT/RN services. Jean Ruiz lives alone with family assist.  ? ?Jean Ruiz was active with [facility] prior to admission. Spoke with Jean Ruiz on 02/18/22 and she was agreeable to ongoing [place] Management services. Expressed understanding referral will be made to [place] and will later be re-referred back to [place].  ? ?Will make referral for [place] for care coordination. Jean Ruiz is slated for discharge on Friday, March 31st from [facility].  ? ?Jean Ruiz has medical history of diastolic HF, HTN, HLD, atrial fibrillation. ? ? ?Marthenia Rolling, MSN, RN,BSN ?[place] Coordinator ?[phone] [facility]) ?[phone]  (Toll free office)  ? ?

## 2022-03-02 NOTE — Patient Outreach (Signed)
[facility]) Care Management ? ?03/02/2022 ? ?Durward Parcel ?[birth year] ?[id number] ? ? ?Received hospital referral from Marthenia Rolling, RN for [place] (central) for care coordination. Assigned patient to Deloria Lair, RN Care Coordinator for follow up. ? ?Philmore Pali ?[place] Management Assistant ?[phone] ? ?

## 2022-03-04 DIAGNOSIS — Z20822 Contact with and (suspected) exposure to covid-19: Secondary | ICD-10-CM | POA: Diagnosis not present

## 2022-03-07 ENCOUNTER — Other Ambulatory Visit: Payer: Self-pay | Admitting: *Deleted

## 2022-03-07 DIAGNOSIS — R296 Repeated falls: Secondary | ICD-10-CM | POA: Diagnosis not present

## 2022-03-07 DIAGNOSIS — E876 Hypokalemia: Secondary | ICD-10-CM | POA: Diagnosis not present

## 2022-03-07 DIAGNOSIS — R531 Weakness: Secondary | ICD-10-CM | POA: Diagnosis not present

## 2022-03-07 DIAGNOSIS — N390 Urinary tract infection, site not specified: Secondary | ICD-10-CM | POA: Diagnosis not present

## 2022-03-07 DIAGNOSIS — E871 Hypo-osmolality and hyponatremia: Secondary | ICD-10-CM | POA: Diagnosis not present

## 2022-03-07 DIAGNOSIS — D519 Vitamin B12 deficiency anemia, unspecified: Secondary | ICD-10-CM | POA: Diagnosis not present

## 2022-03-07 DIAGNOSIS — I10 Essential (primary) hypertension: Secondary | ICD-10-CM | POA: Diagnosis not present

## 2022-03-07 NOTE — Patient Outreach (Signed)
[facility]) Care Management ? ?03/07/2022 ? ?Jean Ruiz ?[birth year] ?[id number] ? ?Telephone outreach for [facility] assessment. No answer. Unable to leave a message. ? ?Eulah Pont. Khali Perella, MSN, GNP-BC ?Gerontological Nurse Practitioner ?[facility] Care Management ?[phone] ? ?4:45 pm Called pt again and no answer. ? ?Will call again tomorrow. ? ?Eulah Pont. Kori Colin, MSN, GNP-BC ?Gerontological Nurse Practitioner ?[facility] Care Management ?[phone] ? ?

## 2022-03-08 ENCOUNTER — Other Ambulatory Visit: Payer: Self-pay | Admitting: *Deleted

## 2022-03-08 ENCOUNTER — Encounter: Payer: Self-pay | Admitting: *Deleted

## 2022-03-08 DIAGNOSIS — I739 Peripheral vascular disease, unspecified: Secondary | ICD-10-CM | POA: Diagnosis not present

## 2022-03-08 DIAGNOSIS — M810 Age-related osteoporosis without current pathological fracture: Secondary | ICD-10-CM | POA: Diagnosis not present

## 2022-03-08 DIAGNOSIS — E039 Hypothyroidism, unspecified: Secondary | ICD-10-CM | POA: Diagnosis not present

## 2022-03-08 DIAGNOSIS — D649 Anemia, unspecified: Secondary | ICD-10-CM | POA: Diagnosis not present

## 2022-03-08 DIAGNOSIS — Z87440 Personal history of urinary (tract) infections: Secondary | ICD-10-CM | POA: Diagnosis not present

## 2022-03-08 DIAGNOSIS — Z9181 History of falling: Secondary | ICD-10-CM | POA: Diagnosis not present

## 2022-03-08 DIAGNOSIS — E46 Unspecified protein-calorie malnutrition: Secondary | ICD-10-CM | POA: Diagnosis not present

## 2022-03-08 DIAGNOSIS — I110 Hypertensive heart disease with heart failure: Secondary | ICD-10-CM | POA: Diagnosis not present

## 2022-03-08 DIAGNOSIS — I4891 Unspecified atrial fibrillation: Secondary | ICD-10-CM | POA: Diagnosis not present

## 2022-03-08 DIAGNOSIS — I5022 Chronic systolic (congestive) heart failure: Secondary | ICD-10-CM | POA: Diagnosis not present

## 2022-03-08 DIAGNOSIS — K580 Irritable bowel syndrome with diarrhea: Secondary | ICD-10-CM | POA: Diagnosis not present

## 2022-03-08 NOTE — Patient Outreach (Signed)
[facility]) Care Management ?Geriatric Nurse Practitioner Note ? ? ?03/08/2022 ?Name:  Jean Ruiz MRN:  [id number] DOB:  Apr 03, 1935 ? ?Summary: ?Pt is home from her SNF stay after hospitalization for a fall. ? ?Recommendations/Changes made from today's visit: ?Follow fall precautions to avoid falls. ? ?Subjective: ?Jean Ruiz is an 87 y.o. year old female who is a primary patient of [name], MD. The care management team was consulted for assistance with care management and/or care coordination needs.   ? ?Geriatric Nurse Practitioner completed Telephone Visit today.  ? ?Objective: ? ?Medications Reviewed Today   ? ? Reviewed by Deloria Lair, NP (Nurse Practitioner) on 03/08/22 at 1422  Med List Status: <None>  ? ?Medication Order Taking? Sig Documenting Provider Last Dose Status Informant  ?amLODipine (NORVASC) 5 MG tablet [id number] Yes Take 5 mg by mouth daily. [provider] Taking Active   ?aspirin EC 81 MG tablet [id number] Yes Take 81 mg by mouth daily. [provider] Taking Active   ?         ?Med Note [name], [name]   Fri Feb 26, 2021  2:45 PM) completed  ?clopidogrel (PLAVIX) 75 MG tablet [id number] Yes Take 75 mg by mouth daily. [provider] Taking Active   ?         ?Med Note [name], [name]   Fri Feb 26, 2021  2:53 PM) Not taking  ?cyanocobalamin (,VITAMIN B-12,) 1000 MCG/ML injection [id number] Yes INJECT ONE MILLILITER WEEKLY FOR FOUR WEEKS THEN, ONE MILLILITER EVERY MONTH [provider] Taking Active   ?         ?Med Note [name], AMMIE L   Mon Apr 04, 2016  9:44 AM) Received from: External Pharmacy  ?furosemide (LASIX) 40 MG tablet [id number] Yes Take 40 mg by mouth daily.  [provider] Taking Active   ?hydrALAZINE (APRESOLINE) 10 MG tablet [id number] Yes Take 10 mg by mouth daily.  [provider] Taking Active   ?hyoscyamine (LEVSIN SL) 0.125 MG SL tablet [id number] Yes Place under the tongue 4 (four) times daily.  [provider] Taking Active   ?Levothyroxine Sodium (SYNTHROID PO) [id number]  Take 125 mcg/day by mouth daily.  [provider]  Active   ?         ?Med Note [name] Dec 30, 2019 11:21 AM) Reports dose decreased to 100 mcg daily  ?lisinopril (PRINIVIL,ZESTRIL) 40 MG tablet [id number] Yes 40 mg daily.  [provider] Taking Active   ?         ?Med Note [name], AMMIE L   Mon Apr 04, 2016  9:44 AM) Received from: External Pharmacy  ?metoprolol succinate (TOPROL-XL) 25 MG 24 hr tablet [id number]  Take 100 mg by mouth 2 (two) times daily.  [provider]  Active   ?potassium chloride SA (K-DUR,KLOR-CON) 20 MEQ tablet [id number] Yes Take 20 mEq by mouth daily. [provider] Taking Active   ?         ?Med Note [name], [name]   Fri Feb 26, 2021  2:56 PM) 3-4 times a week  ? ?  ?  ? ?  ? ? ? ?SDOH:  (Social Determinants of Health) assessments and interventions performed:  ?SDOH Interventions   ? ?Flowsheet Row Most Recent Value  ?SDOH Interventions   ?Food Insecurity Interventions Intervention Not Indicated  ?Transportation Interventions Intervention Not Indicated  ? ?  ? ?Care Plan ? ?  Review of patient past medical history, allergies, medications, health status, including review of consultants reports, laboratory and other test data, was performed as part of comprehensive evaluation for care management services.  ? ? ?Pt will not fall and sustain any injury that would require a trip to the hospital over the next 30 days. ? ?Current Barriers:  ?Chronic Disease Management support and education needs related to Falls ?Falls ? ?RNCM Clinical Goal(s):  ?Patient will verbalize understanding of plan for management of Falls as evidenced by following fall precautions through collaboration with RN Care manager, provider, and care team. nterventions: ?Inter-disciplinary care team collaboration (see longitudinal plan of care) ?Evaluation of current treatment plan related to   self management and patient's adherence to plan as established by provider ?      Pt resides alone, has family nearby, she did not have an injury when she fell before her hospitalization/SNF stay. She has had 21 days of therapy. She is now getting Home Health PT. She reports she does not have any area rugs. She has an emergency alert, elevated commode seat, shower chair, rollator and a cane. Discussed basic fall precautions: arise slowly and stand in place for a minute before walking, move with purpose, be mindful of what you are doing, try to avoid multi-tasking. Don't stand on chairs, stools. Avoid upward gaze. Ask other to move things down to a lower level for easier reach. Wear shoes  and socks with rubber soles. When in doubt about your safety STOP. Ask someone to assist.  ? ?Patient Goals/Self-Care Activities ? ?Take medications as prescribed   ?Attend all scheduled provider appointments ?Call provider office for new concerns or questions  ? ? ?Plan: Telephone follow up appointment with care management team member scheduled for:  May 5th. Pt agrees to the plan of care. ? ?[name], MSN, GNP-BC ?Gerontological Nurse Practitioner ?[facility] Care Management ?[phone] ? ? ? ? ? ?

## 2022-03-09 DIAGNOSIS — K580 Irritable bowel syndrome with diarrhea: Secondary | ICD-10-CM | POA: Diagnosis not present

## 2022-03-09 DIAGNOSIS — D649 Anemia, unspecified: Secondary | ICD-10-CM | POA: Diagnosis not present

## 2022-03-09 DIAGNOSIS — I5022 Chronic systolic (congestive) heart failure: Secondary | ICD-10-CM | POA: Diagnosis not present

## 2022-03-09 DIAGNOSIS — I110 Hypertensive heart disease with heart failure: Secondary | ICD-10-CM | POA: Diagnosis not present

## 2022-03-09 DIAGNOSIS — I739 Peripheral vascular disease, unspecified: Secondary | ICD-10-CM | POA: Diagnosis not present

## 2022-03-09 DIAGNOSIS — I4891 Unspecified atrial fibrillation: Secondary | ICD-10-CM | POA: Diagnosis not present

## 2022-03-11 DIAGNOSIS — I4891 Unspecified atrial fibrillation: Secondary | ICD-10-CM | POA: Diagnosis not present

## 2022-03-11 DIAGNOSIS — I110 Hypertensive heart disease with heart failure: Secondary | ICD-10-CM | POA: Diagnosis not present

## 2022-03-11 DIAGNOSIS — K580 Irritable bowel syndrome with diarrhea: Secondary | ICD-10-CM | POA: Diagnosis not present

## 2022-03-11 DIAGNOSIS — D649 Anemia, unspecified: Secondary | ICD-10-CM | POA: Diagnosis not present

## 2022-03-11 DIAGNOSIS — I5022 Chronic systolic (congestive) heart failure: Secondary | ICD-10-CM | POA: Diagnosis not present

## 2022-03-11 DIAGNOSIS — I739 Peripheral vascular disease, unspecified: Secondary | ICD-10-CM | POA: Diagnosis not present

## 2022-03-14 DIAGNOSIS — I739 Peripheral vascular disease, unspecified: Secondary | ICD-10-CM | POA: Diagnosis not present

## 2022-03-14 DIAGNOSIS — I110 Hypertensive heart disease with heart failure: Secondary | ICD-10-CM | POA: Diagnosis not present

## 2022-03-14 DIAGNOSIS — D649 Anemia, unspecified: Secondary | ICD-10-CM | POA: Diagnosis not present

## 2022-03-14 DIAGNOSIS — I4891 Unspecified atrial fibrillation: Secondary | ICD-10-CM | POA: Diagnosis not present

## 2022-03-14 DIAGNOSIS — K580 Irritable bowel syndrome with diarrhea: Secondary | ICD-10-CM | POA: Diagnosis not present

## 2022-03-14 DIAGNOSIS — I5022 Chronic systolic (congestive) heart failure: Secondary | ICD-10-CM | POA: Diagnosis not present

## 2022-03-15 DIAGNOSIS — I5022 Chronic systolic (congestive) heart failure: Secondary | ICD-10-CM | POA: Diagnosis not present

## 2022-03-15 DIAGNOSIS — I110 Hypertensive heart disease with heart failure: Secondary | ICD-10-CM | POA: Diagnosis not present

## 2022-03-15 DIAGNOSIS — I4891 Unspecified atrial fibrillation: Secondary | ICD-10-CM | POA: Diagnosis not present

## 2022-03-15 DIAGNOSIS — I739 Peripheral vascular disease, unspecified: Secondary | ICD-10-CM | POA: Diagnosis not present

## 2022-03-15 DIAGNOSIS — K580 Irritable bowel syndrome with diarrhea: Secondary | ICD-10-CM | POA: Diagnosis not present

## 2022-03-15 DIAGNOSIS — D649 Anemia, unspecified: Secondary | ICD-10-CM | POA: Diagnosis not present

## 2022-03-17 DIAGNOSIS — D649 Anemia, unspecified: Secondary | ICD-10-CM | POA: Diagnosis not present

## 2022-03-17 DIAGNOSIS — I110 Hypertensive heart disease with heart failure: Secondary | ICD-10-CM | POA: Diagnosis not present

## 2022-03-17 DIAGNOSIS — I739 Peripheral vascular disease, unspecified: Secondary | ICD-10-CM | POA: Diagnosis not present

## 2022-03-17 DIAGNOSIS — K580 Irritable bowel syndrome with diarrhea: Secondary | ICD-10-CM | POA: Diagnosis not present

## 2022-03-17 DIAGNOSIS — I5022 Chronic systolic (congestive) heart failure: Secondary | ICD-10-CM | POA: Diagnosis not present

## 2022-03-17 DIAGNOSIS — I4891 Unspecified atrial fibrillation: Secondary | ICD-10-CM | POA: Diagnosis not present

## 2022-03-18 DIAGNOSIS — I739 Peripheral vascular disease, unspecified: Secondary | ICD-10-CM | POA: Diagnosis not present

## 2022-03-18 DIAGNOSIS — D649 Anemia, unspecified: Secondary | ICD-10-CM | POA: Diagnosis not present

## 2022-03-18 DIAGNOSIS — K580 Irritable bowel syndrome with diarrhea: Secondary | ICD-10-CM | POA: Diagnosis not present

## 2022-03-18 DIAGNOSIS — I110 Hypertensive heart disease with heart failure: Secondary | ICD-10-CM | POA: Diagnosis not present

## 2022-03-18 DIAGNOSIS — I4891 Unspecified atrial fibrillation: Secondary | ICD-10-CM | POA: Diagnosis not present

## 2022-03-18 DIAGNOSIS — I5022 Chronic systolic (congestive) heart failure: Secondary | ICD-10-CM | POA: Diagnosis not present

## 2022-03-21 DIAGNOSIS — D649 Anemia, unspecified: Secondary | ICD-10-CM | POA: Diagnosis not present

## 2022-03-21 DIAGNOSIS — K580 Irritable bowel syndrome with diarrhea: Secondary | ICD-10-CM | POA: Diagnosis not present

## 2022-03-21 DIAGNOSIS — I110 Hypertensive heart disease with heart failure: Secondary | ICD-10-CM | POA: Diagnosis not present

## 2022-03-21 DIAGNOSIS — I739 Peripheral vascular disease, unspecified: Secondary | ICD-10-CM | POA: Diagnosis not present

## 2022-03-21 DIAGNOSIS — Z20822 Contact with and (suspected) exposure to covid-19: Secondary | ICD-10-CM | POA: Diagnosis not present

## 2022-03-21 DIAGNOSIS — I5022 Chronic systolic (congestive) heart failure: Secondary | ICD-10-CM | POA: Diagnosis not present

## 2022-03-21 DIAGNOSIS — I4891 Unspecified atrial fibrillation: Secondary | ICD-10-CM | POA: Diagnosis not present

## 2022-03-22 DIAGNOSIS — I110 Hypertensive heart disease with heart failure: Secondary | ICD-10-CM | POA: Diagnosis not present

## 2022-03-22 DIAGNOSIS — I4891 Unspecified atrial fibrillation: Secondary | ICD-10-CM | POA: Diagnosis not present

## 2022-03-22 DIAGNOSIS — D649 Anemia, unspecified: Secondary | ICD-10-CM | POA: Diagnosis not present

## 2022-03-22 DIAGNOSIS — I5022 Chronic systolic (congestive) heart failure: Secondary | ICD-10-CM | POA: Diagnosis not present

## 2022-03-22 DIAGNOSIS — K580 Irritable bowel syndrome with diarrhea: Secondary | ICD-10-CM | POA: Diagnosis not present

## 2022-03-22 DIAGNOSIS — I739 Peripheral vascular disease, unspecified: Secondary | ICD-10-CM | POA: Diagnosis not present

## 2022-03-24 DIAGNOSIS — I739 Peripheral vascular disease, unspecified: Secondary | ICD-10-CM | POA: Diagnosis not present

## 2022-03-24 DIAGNOSIS — I4891 Unspecified atrial fibrillation: Secondary | ICD-10-CM | POA: Diagnosis not present

## 2022-03-24 DIAGNOSIS — I5022 Chronic systolic (congestive) heart failure: Secondary | ICD-10-CM | POA: Diagnosis not present

## 2022-03-24 DIAGNOSIS — K580 Irritable bowel syndrome with diarrhea: Secondary | ICD-10-CM | POA: Diagnosis not present

## 2022-03-24 DIAGNOSIS — I110 Hypertensive heart disease with heart failure: Secondary | ICD-10-CM | POA: Diagnosis not present

## 2022-03-24 DIAGNOSIS — D649 Anemia, unspecified: Secondary | ICD-10-CM | POA: Diagnosis not present

## 2022-03-28 DIAGNOSIS — D649 Anemia, unspecified: Secondary | ICD-10-CM | POA: Diagnosis not present

## 2022-03-28 DIAGNOSIS — I5022 Chronic systolic (congestive) heart failure: Secondary | ICD-10-CM | POA: Diagnosis not present

## 2022-03-28 DIAGNOSIS — I739 Peripheral vascular disease, unspecified: Secondary | ICD-10-CM | POA: Diagnosis not present

## 2022-03-28 DIAGNOSIS — I110 Hypertensive heart disease with heart failure: Secondary | ICD-10-CM | POA: Diagnosis not present

## 2022-03-28 DIAGNOSIS — I4891 Unspecified atrial fibrillation: Secondary | ICD-10-CM | POA: Diagnosis not present

## 2022-03-28 DIAGNOSIS — K580 Irritable bowel syndrome with diarrhea: Secondary | ICD-10-CM | POA: Diagnosis not present

## 2022-03-29 DIAGNOSIS — D649 Anemia, unspecified: Secondary | ICD-10-CM | POA: Diagnosis not present

## 2022-03-29 DIAGNOSIS — I4891 Unspecified atrial fibrillation: Secondary | ICD-10-CM | POA: Diagnosis not present

## 2022-03-29 DIAGNOSIS — K580 Irritable bowel syndrome with diarrhea: Secondary | ICD-10-CM | POA: Diagnosis not present

## 2022-03-29 DIAGNOSIS — I5022 Chronic systolic (congestive) heart failure: Secondary | ICD-10-CM | POA: Diagnosis not present

## 2022-03-29 DIAGNOSIS — I739 Peripheral vascular disease, unspecified: Secondary | ICD-10-CM | POA: Diagnosis not present

## 2022-03-29 DIAGNOSIS — I110 Hypertensive heart disease with heart failure: Secondary | ICD-10-CM | POA: Diagnosis not present

## 2022-03-31 DIAGNOSIS — Z20822 Contact with and (suspected) exposure to covid-19: Secondary | ICD-10-CM | POA: Diagnosis not present

## 2022-04-01 DIAGNOSIS — I739 Peripheral vascular disease, unspecified: Secondary | ICD-10-CM | POA: Diagnosis not present

## 2022-04-01 DIAGNOSIS — K580 Irritable bowel syndrome with diarrhea: Secondary | ICD-10-CM | POA: Diagnosis not present

## 2022-04-01 DIAGNOSIS — I4891 Unspecified atrial fibrillation: Secondary | ICD-10-CM | POA: Diagnosis not present

## 2022-04-01 DIAGNOSIS — I110 Hypertensive heart disease with heart failure: Secondary | ICD-10-CM | POA: Diagnosis not present

## 2022-04-01 DIAGNOSIS — D649 Anemia, unspecified: Secondary | ICD-10-CM | POA: Diagnosis not present

## 2022-04-01 DIAGNOSIS — I5022 Chronic systolic (congestive) heart failure: Secondary | ICD-10-CM | POA: Diagnosis not present

## 2022-04-04 DIAGNOSIS — D649 Anemia, unspecified: Secondary | ICD-10-CM | POA: Diagnosis not present

## 2022-04-04 DIAGNOSIS — I739 Peripheral vascular disease, unspecified: Secondary | ICD-10-CM | POA: Diagnosis not present

## 2022-04-04 DIAGNOSIS — K580 Irritable bowel syndrome with diarrhea: Secondary | ICD-10-CM | POA: Diagnosis not present

## 2022-04-04 DIAGNOSIS — I5022 Chronic systolic (congestive) heart failure: Secondary | ICD-10-CM | POA: Diagnosis not present

## 2022-04-04 DIAGNOSIS — I110 Hypertensive heart disease with heart failure: Secondary | ICD-10-CM | POA: Diagnosis not present

## 2022-04-04 DIAGNOSIS — I4891 Unspecified atrial fibrillation: Secondary | ICD-10-CM | POA: Diagnosis not present

## 2022-04-05 DIAGNOSIS — I110 Hypertensive heart disease with heart failure: Secondary | ICD-10-CM | POA: Diagnosis not present

## 2022-04-05 DIAGNOSIS — I5022 Chronic systolic (congestive) heart failure: Secondary | ICD-10-CM | POA: Diagnosis not present

## 2022-04-05 DIAGNOSIS — I4891 Unspecified atrial fibrillation: Secondary | ICD-10-CM | POA: Diagnosis not present

## 2022-04-05 DIAGNOSIS — K580 Irritable bowel syndrome with diarrhea: Secondary | ICD-10-CM | POA: Diagnosis not present

## 2022-04-05 DIAGNOSIS — I739 Peripheral vascular disease, unspecified: Secondary | ICD-10-CM | POA: Diagnosis not present

## 2022-04-05 DIAGNOSIS — D649 Anemia, unspecified: Secondary | ICD-10-CM | POA: Diagnosis not present

## 2022-04-06 ENCOUNTER — Other Ambulatory Visit: Payer: Self-pay | Admitting: *Deleted

## 2022-04-06 NOTE — Patient Instructions (Signed)
Visit Information ? ?Thank you for taking time to visit with me today. Please don't hesitate to contact me if I can be of assistance to you before our next scheduled telephone appointment. ? ?Following are the goals we discussed today:  ?(Copy and paste patient goals from clinical care plan here) ? ?Our next appointment is by telephone on June 1st in the am. ? ?Following is a copy of your care plan: Care Plan ?  ?Review of patient past medical history, allergies, medications, health status, including review of consultants reports, laboratory and other test data, was performed as part of comprehensive evaluation for care management services.  ?  ?Pt will continue to do daily strengthening exercises after discharge from PT per report to avoid falls over the next 30 days. ?  ?Interventions: ?Inter-disciplinary care team collaboration (see longitudinal plan of care) ?Evaluation of current treatment plan related to  self management and patient's adherence to plan as established by provider - Has been participating in home health PT and will probably be discharged within the next week or two. Pt is doing assigned exercises on a daily basis. She has not had any falls. ?  ?  ?Will review Living Will, discuss with family and friends who are designated decision makers and update if any changes are necessary. ?  ?Interventions: ?Inter-disciplinary care team collaboration (see longitudinal plan of care) ?Evaluation of current treatment plan related to  self management and patient's adherence to plan as established by provider ?Established that pt does have a Living Will and HCPOA, POA.  ?Reviewed decisions on current Living Will, collected names and numbers of family and friends to add to Epic chart. ?  ?  ?Plan: Telephone follow up appointment with care management on June 1st. ? ?The patient verbalized understanding of instructions, educational materials, and care plan provided today and agreed to receive a mailed copy of  patient instructions, educational materials, and care plan.  ? ?Eulah Pont. Dalaysia Harms, MSN, GNP-BC ?Gerontological Nurse Practitioner ?[facility] Care Management ?[phone] ? ? ? ? ?

## 2022-04-06 NOTE — Patient Outreach (Signed)
[facility]) Care Management ?Geriatric Nurse Practitioner Note ? ? ?04/06/2022 ?Name:  Jean Ruiz MRN:  [id number] DOB:  [birth year] ? ?Summary: ?Pt is very well. Mobility improved, no falls. Reviewed end of life planning. ? ?Recommendations/Changes made from today's visit: ?Review living will and discuss with family and friends to update if necessary. ?Continue recommended exercises by PT. Follow safety precautions. ? ?Subjective: ?Jean Ruiz is an 87 y.o. year old female who is a primary patient of [name], MD. The care management team was consulted for assistance with care management and/or care coordination needs.   ? ?Geriatric Nurse Practitioner completed Telephone Visit today.  ? ?Medications Reviewed Today   ? ? Reviewed by Deloria Lair, NP (Nurse Practitioner) on 03/08/22 at 1422  Med List Status: <None>  ? ?Medication Order Taking? Sig Documenting Provider Last Dose Status Informant  ?amLODipine (NORVASC) 5 MG tablet [id number] Yes Take 5 mg by mouth daily. [provider] Taking Active   ?aspirin EC 81 MG tablet [id number] Yes Take 81 mg by mouth daily. [provider] Taking Active   ?         ?Med Note [name], [name]   Fri Feb 26, 2021  2:45 PM) completed  ?clopidogrel (PLAVIX) 75 MG tablet [id number] Yes Take 75 mg by mouth daily. [provider] Taking Active   ?         ?Med Note [name], [name]   Fri Feb 26, 2021  2:53 PM) Not taking  ?cyanocobalamin (,VITAMIN B-12,) 1000 MCG/ML injection [id number] Yes INJECT ONE MILLILITER WEEKLY FOR FOUR WEEKS THEN, ONE MILLILITER EVERY MONTH [provider] Taking Active   ?         ?Med Note [name], AMMIE L   Mon Apr 04, 2016  9:44 AM) Received from: External Pharmacy  ?furosemide (LASIX) 40 MG tablet [id number] Yes Take 40 mg by mouth daily.  [provider] Taking Active   ?hydrALAZINE (APRESOLINE) 10 MG tablet [id number] Yes Take 10 mg by mouth daily.  [provider] Taking Active    ?hyoscyamine (LEVSIN SL) 0.125 MG SL tablet [id number] Yes Place under the tongue 4 (four) times daily. [provider] Taking Active   ?Levothyroxine Sodium (SYNTHROID PO) [id number]  Take 125 mcg/day by mouth daily.  [provider]  Active   ?         ?Med Note [name] Dec 30, 2019 11:21 AM) Reports dose decreased to 100 mcg daily  ?lisinopril (PRINIVIL,ZESTRIL) 40 MG tablet [id number] Yes 40 mg daily.  [provider] Taking Active   ?         ?Med Note [name], AMMIE L   Mon Apr 04, 2016  9:44 AM) Received from: External Pharmacy  ?metoprolol succinate (TOPROL-XL) 25 MG 24 hr tablet [id number]  Take 100 mg by mouth 2 (two) times daily.  [provider]  Active   ?potassium chloride SA (K-DUR,KLOR-CON) 20 MEQ tablet [id number] Yes Take 20 mEq by mouth daily. [provider] Taking Active   ?         ?Med Note [name], [name]   Fri Feb 26, 2021  2:56 PM) 3-4 times a week  ? ?  ?  ? ?  ? ?Care Plan ? ?Review of patient past medical history, allergies, medications, health status, including review of consultants reports, laboratory and other test data, was performed as part of comprehensive evaluation for care management services.  ? ?  Pt will continue to do daily strengthening exercises after discharge from PT per report to avoid falls over the next 30 days. ? ?Interventions: ?Inter-disciplinary care team collaboration (see longitudinal plan of care) ?Evaluation of current treatment plan related to  self management and patient's adherence to plan as established by provider - Has been participating in home health PT and will probably be discharged within the next week or two. Pt is doing assigned exercises on a daily basis. She has not had any falls. ? ? ?Will review Living Will, discuss with family and friends who are designated decision makers and update if any changes are necessary. ? ?Interventions: ?Inter-disciplinary care team collaboration (see longitudinal  plan of care) ?Evaluation of current treatment plan related to  self management and patient's adherence to plan as established by provider ?Established that pt does have a Living Will and HCPOA, POA.  ?Reviewed decisions on current Living Will, collected names and numbers of family and friends to add to Epic chart. ?  ? ?Plan: Telephone follow up appointment with care management team member scheduled for:  June 1st. Pt agrees to the plan of care. ? ?Eulah Pont. Rudine Rieger, MSN, GNP-BC ?Gerontological Nurse Practitioner ?[facility] Care Management ?[phone] ? ? ? ? ? ?

## 2022-04-07 DIAGNOSIS — E46 Unspecified protein-calorie malnutrition: Secondary | ICD-10-CM | POA: Diagnosis not present

## 2022-04-07 DIAGNOSIS — I5022 Chronic systolic (congestive) heart failure: Secondary | ICD-10-CM | POA: Diagnosis not present

## 2022-04-07 DIAGNOSIS — Z9181 History of falling: Secondary | ICD-10-CM | POA: Diagnosis not present

## 2022-04-07 DIAGNOSIS — D649 Anemia, unspecified: Secondary | ICD-10-CM | POA: Diagnosis not present

## 2022-04-07 DIAGNOSIS — I739 Peripheral vascular disease, unspecified: Secondary | ICD-10-CM | POA: Diagnosis not present

## 2022-04-07 DIAGNOSIS — I110 Hypertensive heart disease with heart failure: Secondary | ICD-10-CM | POA: Diagnosis not present

## 2022-04-07 DIAGNOSIS — E039 Hypothyroidism, unspecified: Secondary | ICD-10-CM | POA: Diagnosis not present

## 2022-04-07 DIAGNOSIS — K580 Irritable bowel syndrome with diarrhea: Secondary | ICD-10-CM | POA: Diagnosis not present

## 2022-04-07 DIAGNOSIS — I4891 Unspecified atrial fibrillation: Secondary | ICD-10-CM | POA: Diagnosis not present

## 2022-04-07 DIAGNOSIS — M810 Age-related osteoporosis without current pathological fracture: Secondary | ICD-10-CM | POA: Diagnosis not present

## 2022-04-07 DIAGNOSIS — Z87440 Personal history of urinary (tract) infections: Secondary | ICD-10-CM | POA: Diagnosis not present

## 2022-04-08 DIAGNOSIS — Z20822 Contact with and (suspected) exposure to covid-19: Secondary | ICD-10-CM | POA: Diagnosis not present

## 2022-04-11 DIAGNOSIS — K580 Irritable bowel syndrome with diarrhea: Secondary | ICD-10-CM | POA: Diagnosis not present

## 2022-04-11 DIAGNOSIS — I5022 Chronic systolic (congestive) heart failure: Secondary | ICD-10-CM | POA: Diagnosis not present

## 2022-04-11 DIAGNOSIS — I739 Peripheral vascular disease, unspecified: Secondary | ICD-10-CM | POA: Diagnosis not present

## 2022-04-11 DIAGNOSIS — I110 Hypertensive heart disease with heart failure: Secondary | ICD-10-CM | POA: Diagnosis not present

## 2022-04-11 DIAGNOSIS — D649 Anemia, unspecified: Secondary | ICD-10-CM | POA: Diagnosis not present

## 2022-04-11 DIAGNOSIS — Z20822 Contact with and (suspected) exposure to covid-19: Secondary | ICD-10-CM | POA: Diagnosis not present

## 2022-04-11 DIAGNOSIS — I4891 Unspecified atrial fibrillation: Secondary | ICD-10-CM | POA: Diagnosis not present

## 2022-04-12 DIAGNOSIS — I110 Hypertensive heart disease with heart failure: Secondary | ICD-10-CM | POA: Diagnosis not present

## 2022-04-12 DIAGNOSIS — I739 Peripheral vascular disease, unspecified: Secondary | ICD-10-CM | POA: Diagnosis not present

## 2022-04-12 DIAGNOSIS — K580 Irritable bowel syndrome with diarrhea: Secondary | ICD-10-CM | POA: Diagnosis not present

## 2022-04-12 DIAGNOSIS — I5022 Chronic systolic (congestive) heart failure: Secondary | ICD-10-CM | POA: Diagnosis not present

## 2022-04-12 DIAGNOSIS — D649 Anemia, unspecified: Secondary | ICD-10-CM | POA: Diagnosis not present

## 2022-04-12 DIAGNOSIS — I4891 Unspecified atrial fibrillation: Secondary | ICD-10-CM | POA: Diagnosis not present

## 2022-04-18 DIAGNOSIS — I5022 Chronic systolic (congestive) heart failure: Secondary | ICD-10-CM | POA: Diagnosis not present

## 2022-04-18 DIAGNOSIS — D649 Anemia, unspecified: Secondary | ICD-10-CM | POA: Diagnosis not present

## 2022-04-18 DIAGNOSIS — I739 Peripheral vascular disease, unspecified: Secondary | ICD-10-CM | POA: Diagnosis not present

## 2022-04-18 DIAGNOSIS — I110 Hypertensive heart disease with heart failure: Secondary | ICD-10-CM | POA: Diagnosis not present

## 2022-04-18 DIAGNOSIS — I4891 Unspecified atrial fibrillation: Secondary | ICD-10-CM | POA: Diagnosis not present

## 2022-04-18 DIAGNOSIS — K580 Irritable bowel syndrome with diarrhea: Secondary | ICD-10-CM | POA: Diagnosis not present

## 2022-04-19 DIAGNOSIS — I4891 Unspecified atrial fibrillation: Secondary | ICD-10-CM | POA: Diagnosis not present

## 2022-04-19 DIAGNOSIS — I5022 Chronic systolic (congestive) heart failure: Secondary | ICD-10-CM | POA: Diagnosis not present

## 2022-04-19 DIAGNOSIS — I739 Peripheral vascular disease, unspecified: Secondary | ICD-10-CM | POA: Diagnosis not present

## 2022-04-19 DIAGNOSIS — K580 Irritable bowel syndrome with diarrhea: Secondary | ICD-10-CM | POA: Diagnosis not present

## 2022-04-19 DIAGNOSIS — D649 Anemia, unspecified: Secondary | ICD-10-CM | POA: Diagnosis not present

## 2022-04-19 DIAGNOSIS — I110 Hypertensive heart disease with heart failure: Secondary | ICD-10-CM | POA: Diagnosis not present

## 2022-04-25 DIAGNOSIS — D649 Anemia, unspecified: Secondary | ICD-10-CM | POA: Diagnosis not present

## 2022-04-25 DIAGNOSIS — I110 Hypertensive heart disease with heart failure: Secondary | ICD-10-CM | POA: Diagnosis not present

## 2022-04-25 DIAGNOSIS — I4891 Unspecified atrial fibrillation: Secondary | ICD-10-CM | POA: Diagnosis not present

## 2022-04-25 DIAGNOSIS — I739 Peripheral vascular disease, unspecified: Secondary | ICD-10-CM | POA: Diagnosis not present

## 2022-04-25 DIAGNOSIS — I5022 Chronic systolic (congestive) heart failure: Secondary | ICD-10-CM | POA: Diagnosis not present

## 2022-04-25 DIAGNOSIS — K580 Irritable bowel syndrome with diarrhea: Secondary | ICD-10-CM | POA: Diagnosis not present

## 2022-04-26 DIAGNOSIS — I110 Hypertensive heart disease with heart failure: Secondary | ICD-10-CM | POA: Diagnosis not present

## 2022-04-26 DIAGNOSIS — I739 Peripheral vascular disease, unspecified: Secondary | ICD-10-CM | POA: Diagnosis not present

## 2022-04-26 DIAGNOSIS — I5022 Chronic systolic (congestive) heart failure: Secondary | ICD-10-CM | POA: Diagnosis not present

## 2022-04-26 DIAGNOSIS — K580 Irritable bowel syndrome with diarrhea: Secondary | ICD-10-CM | POA: Diagnosis not present

## 2022-04-26 DIAGNOSIS — I4891 Unspecified atrial fibrillation: Secondary | ICD-10-CM | POA: Diagnosis not present

## 2022-04-26 DIAGNOSIS — D649 Anemia, unspecified: Secondary | ICD-10-CM | POA: Diagnosis not present

## 2022-05-02 DIAGNOSIS — I4891 Unspecified atrial fibrillation: Secondary | ICD-10-CM | POA: Diagnosis not present

## 2022-05-02 DIAGNOSIS — D649 Anemia, unspecified: Secondary | ICD-10-CM | POA: Diagnosis not present

## 2022-05-02 DIAGNOSIS — K580 Irritable bowel syndrome with diarrhea: Secondary | ICD-10-CM | POA: Diagnosis not present

## 2022-05-02 DIAGNOSIS — I739 Peripheral vascular disease, unspecified: Secondary | ICD-10-CM | POA: Diagnosis not present

## 2022-05-02 DIAGNOSIS — I5022 Chronic systolic (congestive) heart failure: Secondary | ICD-10-CM | POA: Diagnosis not present

## 2022-05-02 DIAGNOSIS — I110 Hypertensive heart disease with heart failure: Secondary | ICD-10-CM | POA: Diagnosis not present

## 2022-05-03 DIAGNOSIS — E039 Hypothyroidism, unspecified: Secondary | ICD-10-CM | POA: Diagnosis not present

## 2022-05-03 DIAGNOSIS — Z6825 Body mass index (BMI) 25.0-25.9, adult: Secondary | ICD-10-CM | POA: Diagnosis not present

## 2022-05-03 DIAGNOSIS — M810 Age-related osteoporosis without current pathological fracture: Secondary | ICD-10-CM | POA: Diagnosis not present

## 2022-05-03 DIAGNOSIS — Z85850 Personal history of malignant neoplasm of thyroid: Secondary | ICD-10-CM | POA: Diagnosis not present

## 2022-05-03 DIAGNOSIS — E890 Postprocedural hypothyroidism: Secondary | ICD-10-CM | POA: Diagnosis not present

## 2022-05-03 DIAGNOSIS — I10 Essential (primary) hypertension: Secondary | ICD-10-CM | POA: Diagnosis not present

## 2022-05-05 ENCOUNTER — Ambulatory Visit: Payer: Self-pay | Admitting: *Deleted

## 2022-05-05 NOTE — Patient Outreach (Signed)
[facility]) Care Management  05/05/2022  Jean Ruiz 04/16/1935 [id number]  Telephone outreach for monthly check in for post SNF stay. Left message and requested a return call.  [name], MSN, [facility] Gerontological Nurse Practitioner [facility] Care Management [phone]

## 2022-05-06 DIAGNOSIS — I739 Peripheral vascular disease, unspecified: Secondary | ICD-10-CM | POA: Diagnosis not present

## 2022-05-06 DIAGNOSIS — K580 Irritable bowel syndrome with diarrhea: Secondary | ICD-10-CM | POA: Diagnosis not present

## 2022-05-06 DIAGNOSIS — I110 Hypertensive heart disease with heart failure: Secondary | ICD-10-CM | POA: Diagnosis not present

## 2022-05-06 DIAGNOSIS — D649 Anemia, unspecified: Secondary | ICD-10-CM | POA: Diagnosis not present

## 2022-05-06 DIAGNOSIS — I5022 Chronic systolic (congestive) heart failure: Secondary | ICD-10-CM | POA: Diagnosis not present

## 2022-05-06 DIAGNOSIS — I4891 Unspecified atrial fibrillation: Secondary | ICD-10-CM | POA: Diagnosis not present

## 2022-05-10 ENCOUNTER — Other Ambulatory Visit: Payer: Self-pay | Admitting: *Deleted

## 2022-05-10 NOTE — Patient Outreach (Signed)
[facility]) Care Management Geriatric Nurse Practitioner Note   05/10/2022 Name:  Jean Ruiz MRN:  [id number] DOB:  29-Mar-1935  Summary: Stable  Recommendations/Changes made from today's visit: Continue home exercises and follow fall precautions.  Subjective: Jean Ruiz is an 87 y.o. year old female who is a primary patient of [name], MD. The care management team was consulted for assistance with care management and/or care coordination needs.    Geriatric Nurse Practitioner completed Telephone Visit today.   Objective: Pt reported weight 146, no SOB, occasional L ankle swelling.  Care Plan  Review of patient past medical history, allergies, medications, health status, including review of consultants reports, laboratory and other test data, was performed as part of comprehensive evaluation for care management services.   Care Plan : [facility] NP Plan of Care  Updates made by Deloria Lair, NP since 05/10/2022 12:00 AM     Problem: High Risk for Falls   Priority: High  Onset Date: 03/08/2022     Goal: Pt will continue to do daily strengthening exercises after discharge from PT per report to avoid falls over the next 30 days.   Start Date: 04/06/2022  Expected End Date: 05/05/2022  This Visit's Progress: On track  Priority: High  Note:    Update 05/10/22:  (Status: Goal Met.) Short Term Goal  Evaluation of current treatment plan related to EXERCISE and PT and patient's adherence to plan as established by provider Pt reports she had her last PT treatment last week. She is doing the recommended exercises daily. She keeps a list by her chair and is faithful in doing them. She does report having one fall last Thursday when she went out with family to visit someone. She caught her foot on the curb and fell. She reports she is sore but the only injury she sustained is a hematoma on her hand. We discussed fall prevention and continuation of her exercises to  maintain the strength she has gained.  Current Barriers:  Falls  RNCM Clinical Goal(s):  Patient will verbalize understanding of plan for management of Mobility Maintenance as evidenced by pt report. work with PT to gain strength and greater mobility as evidenced by pt report  through collaboration with Consulting civil engineer, provider, and care team.   Interventions: Inter-disciplinary care team collaboration (see longitudinal plan of care) Evaluation of current treatment plan related to  self management and patient's adherence to plan as established by provider - Has been participating in home health PT and will probably be discharged within the next week or two. Pt is doing assigned exercises on a daily basis. She has not had any falls.  Patient Goals/Self-Care Activities: Attend all scheduled provider appointments Perform IADL's (shopping, preparing meals, housekeeping, managing finances) independently Call provider office for new concerns or questions       Problem: Need to review Livng Will and make changes if pertinent as evidenced by discussion in one month.   Priority: Medium  Onset Date: 04/06/2022     Goal: Will review Living Will, discuss with family and friends who are designated decision makers and update if any changes are necessary.   Start Date: 04/06/2022  Expected End Date: 05/05/2022  This Visit's Progress: On track  Priority: Medium  Note:    Update 05/10/22:  (Status: Goal Met.) Short Term Goal  Evaluation of current treatment plan related to HCPOA and Living Will Documents and patient's adherence to plan as established by provider Pt did  review her documents and says she feels like Jean Ruiz has a copy. This is not evident in Epic. A goals of care conversation was completed on our last conversation and a MOST form was sent. She reports she did receive this. Encouraged her to fill out and post since she shared she is not interested in having CPR.   Current Barriers:   Knowledge Deficits related to plan of care for management of End of life care planning.   RNCM Clinical Goal(s):  Patient will verbalize understanding of plan for management of Updating her Living Will  as evidenced by Discussion  through collaboration with RN Care manager, provider, and care team.   Interventions: Inter-disciplinary care team collaboration (see longitudinal plan of care) Evaluation of current treatment plan related to  self management and patient's adherence to plan as established by provider Established that pt does have a Living Will and [place].  Reviewed decisions on current Living Will, collected names and numbers of family and friends to add to Epic chart.   Patient Goals/Self-Care Activities: Thnk about any changes pt wants to make in her [place].        Plan: No further follow up required: PATIENT HAS ACHIEVED HER GOALS. ENCOURAGED TO CALL MD/NP FOR ADVICE WHENEVER SHE HAS QUESTIONS OR PROBLEMS!  Jean Ruiz. Jean Neither, MSN, [facility] Gerontological Nurse Practitioner [facility] Care Management [phone]

## 2022-05-16 DIAGNOSIS — M545 Low back pain, unspecified: Secondary | ICD-10-CM | POA: Diagnosis not present

## 2022-05-16 DIAGNOSIS — R29898 Other symptoms and signs involving the musculoskeletal system: Secondary | ICD-10-CM | POA: Diagnosis not present

## 2022-05-16 DIAGNOSIS — S60221A Contusion of right hand, initial encounter: Secondary | ICD-10-CM | POA: Diagnosis not present

## 2022-05-16 DIAGNOSIS — M546 Pain in thoracic spine: Secondary | ICD-10-CM | POA: Diagnosis not present

## 2022-05-16 DIAGNOSIS — M4727 Other spondylosis with radiculopathy, lumbosacral region: Secondary | ICD-10-CM | POA: Diagnosis not present

## 2022-05-16 DIAGNOSIS — S60211A Contusion of right wrist, initial encounter: Secondary | ICD-10-CM | POA: Diagnosis not present

## 2022-05-24 ENCOUNTER — Other Ambulatory Visit: Payer: Self-pay | Admitting: Orthopedic Surgery

## 2022-05-24 DIAGNOSIS — M4726 Other spondylosis with radiculopathy, lumbar region: Secondary | ICD-10-CM | POA: Diagnosis not present

## 2022-05-24 DIAGNOSIS — M8008XA Age-related osteoporosis with current pathological fracture, vertebra(e), initial encounter for fracture: Secondary | ICD-10-CM | POA: Diagnosis not present

## 2022-05-25 ENCOUNTER — Ambulatory Visit (HOSPITAL_COMMUNITY)
Admission: RE | Admit: 2022-05-25 | Discharge: 2022-05-25 | Disposition: A | Discharge status: Home / Self Care | Payer: Medicare Other | Source: Ambulatory Visit | Attending: Orthopedic Surgery | Admitting: Orthopedic Surgery

## 2022-05-25 DIAGNOSIS — M8008XA Age-related osteoporosis with current pathological fracture, vertebra(e), initial encounter for fracture: Secondary | ICD-10-CM | POA: Insufficient documentation

## 2022-05-25 DIAGNOSIS — M4316 Spondylolisthesis, lumbar region: Secondary | ICD-10-CM | POA: Diagnosis not present

## 2022-05-25 DIAGNOSIS — M810 Age-related osteoporosis without current pathological fracture: Secondary | ICD-10-CM | POA: Diagnosis not present

## 2022-05-26 DIAGNOSIS — M8008XA Age-related osteoporosis with current pathological fracture, vertebra(e), initial encounter for fracture: Secondary | ICD-10-CM | POA: Diagnosis not present

## 2022-06-03 DIAGNOSIS — M8008XA Age-related osteoporosis with current pathological fracture, vertebra(e), initial encounter for fracture: Secondary | ICD-10-CM | POA: Diagnosis not present

## 2022-06-27 DIAGNOSIS — M549 Dorsalgia, unspecified: Secondary | ICD-10-CM | POA: Diagnosis not present

## 2022-06-27 DIAGNOSIS — M8008XS Age-related osteoporosis with current pathological fracture, vertebra(e), sequela: Secondary | ICD-10-CM | POA: Diagnosis not present

## 2022-07-01 DIAGNOSIS — G8191 Hemiplegia, unspecified affecting right dominant side: Secondary | ICD-10-CM | POA: Diagnosis not present

## 2022-07-01 DIAGNOSIS — I10 Essential (primary) hypertension: Secondary | ICD-10-CM | POA: Diagnosis not present

## 2022-07-01 DIAGNOSIS — K580 Irritable bowel syndrome with diarrhea: Secondary | ICD-10-CM | POA: Diagnosis not present

## 2022-07-01 DIAGNOSIS — E890 Postprocedural hypothyroidism: Secondary | ICD-10-CM | POA: Diagnosis not present

## 2022-07-01 DIAGNOSIS — E785 Hyperlipidemia, unspecified: Secondary | ICD-10-CM | POA: Diagnosis not present

## 2022-07-01 DIAGNOSIS — D519 Vitamin B12 deficiency anemia, unspecified: Secondary | ICD-10-CM | POA: Diagnosis not present

## 2022-07-01 DIAGNOSIS — I4891 Unspecified atrial fibrillation: Secondary | ICD-10-CM | POA: Diagnosis not present

## 2022-08-11 DIAGNOSIS — M810 Age-related osteoporosis without current pathological fracture: Secondary | ICD-10-CM | POA: Diagnosis not present

## 2022-08-11 DIAGNOSIS — D519 Vitamin B12 deficiency anemia, unspecified: Secondary | ICD-10-CM | POA: Diagnosis not present

## 2022-09-09 DIAGNOSIS — Z23 Encounter for immunization: Secondary | ICD-10-CM | POA: Diagnosis not present

## 2022-10-03 DIAGNOSIS — E890 Postprocedural hypothyroidism: Secondary | ICD-10-CM | POA: Diagnosis not present

## 2022-10-03 DIAGNOSIS — I4891 Unspecified atrial fibrillation: Secondary | ICD-10-CM | POA: Diagnosis not present

## 2022-10-03 DIAGNOSIS — D519 Vitamin B12 deficiency anemia, unspecified: Secondary | ICD-10-CM | POA: Diagnosis not present

## 2022-10-03 DIAGNOSIS — K580 Irritable bowel syndrome with diarrhea: Secondary | ICD-10-CM | POA: Diagnosis not present

## 2022-10-03 DIAGNOSIS — I10 Essential (primary) hypertension: Secondary | ICD-10-CM | POA: Diagnosis not present

## 2022-10-03 DIAGNOSIS — E785 Hyperlipidemia, unspecified: Secondary | ICD-10-CM | POA: Diagnosis not present

## 2022-10-03 DIAGNOSIS — G8191 Hemiplegia, unspecified affecting right dominant side: Secondary | ICD-10-CM | POA: Diagnosis not present

## 2022-10-03 DIAGNOSIS — R0982 Postnasal drip: Secondary | ICD-10-CM | POA: Diagnosis not present

## 2022-11-03 DIAGNOSIS — Z85850 Personal history of malignant neoplasm of thyroid: Secondary | ICD-10-CM | POA: Diagnosis not present

## 2022-11-03 DIAGNOSIS — E039 Hypothyroidism, unspecified: Secondary | ICD-10-CM | POA: Diagnosis not present

## 2022-11-03 DIAGNOSIS — E890 Postprocedural hypothyroidism: Secondary | ICD-10-CM | POA: Diagnosis not present

## 2022-11-08 DIAGNOSIS — D519 Vitamin B12 deficiency anemia, unspecified: Secondary | ICD-10-CM | POA: Diagnosis not present

## 2022-11-18 DIAGNOSIS — R600 Localized edema: Secondary | ICD-10-CM | POA: Diagnosis not present

## 2022-11-18 DIAGNOSIS — M7989 Other specified soft tissue disorders: Secondary | ICD-10-CM | POA: Diagnosis not present

## 2022-12-07 DIAGNOSIS — J069 Acute upper respiratory infection, unspecified: Secondary | ICD-10-CM | POA: Diagnosis not present

## 2022-12-15 DIAGNOSIS — D519 Vitamin B12 deficiency anemia, unspecified: Secondary | ICD-10-CM | POA: Diagnosis not present

## 2022-12-21 DIAGNOSIS — H5203 Hypermetropia, bilateral: Secondary | ICD-10-CM | POA: Diagnosis not present

## 2022-12-21 DIAGNOSIS — H2703 Aphakia, bilateral: Secondary | ICD-10-CM | POA: Diagnosis not present

## 2023-01-03 DIAGNOSIS — Z9181 History of falling: Secondary | ICD-10-CM | POA: Diagnosis not present

## 2023-01-03 DIAGNOSIS — I4891 Unspecified atrial fibrillation: Secondary | ICD-10-CM | POA: Diagnosis not present

## 2023-01-03 DIAGNOSIS — I10 Essential (primary) hypertension: Secondary | ICD-10-CM | POA: Diagnosis not present

## 2023-01-03 DIAGNOSIS — E890 Postprocedural hypothyroidism: Secondary | ICD-10-CM | POA: Diagnosis not present

## 2023-01-03 DIAGNOSIS — E785 Hyperlipidemia, unspecified: Secondary | ICD-10-CM | POA: Diagnosis not present

## 2023-01-03 DIAGNOSIS — D519 Vitamin B12 deficiency anemia, unspecified: Secondary | ICD-10-CM | POA: Diagnosis not present

## 2023-01-03 DIAGNOSIS — Z1331 Encounter for screening for depression: Secondary | ICD-10-CM | POA: Diagnosis not present

## 2023-01-17 DIAGNOSIS — D519 Vitamin B12 deficiency anemia, unspecified: Secondary | ICD-10-CM | POA: Diagnosis not present

## 2023-02-23 DIAGNOSIS — D519 Vitamin B12 deficiency anemia, unspecified: Secondary | ICD-10-CM | POA: Diagnosis not present

## 2023-02-23 DIAGNOSIS — M810 Age-related osteoporosis without current pathological fracture: Secondary | ICD-10-CM | POA: Diagnosis not present

## 2023-03-05 DIAGNOSIS — D519 Vitamin B12 deficiency anemia, unspecified: Secondary | ICD-10-CM | POA: Diagnosis not present

## 2023-03-05 DIAGNOSIS — E785 Hyperlipidemia, unspecified: Secondary | ICD-10-CM | POA: Diagnosis not present

## 2023-03-05 DIAGNOSIS — I5031 Acute diastolic (congestive) heart failure: Secondary | ICD-10-CM | POA: Diagnosis not present

## 2023-03-05 DIAGNOSIS — I4891 Unspecified atrial fibrillation: Secondary | ICD-10-CM | POA: Diagnosis not present

## 2023-03-06 DIAGNOSIS — I4891 Unspecified atrial fibrillation: Secondary | ICD-10-CM | POA: Diagnosis not present

## 2023-03-06 DIAGNOSIS — I10 Essential (primary) hypertension: Secondary | ICD-10-CM | POA: Diagnosis not present

## 2023-03-06 DIAGNOSIS — E890 Postprocedural hypothyroidism: Secondary | ICD-10-CM | POA: Diagnosis not present

## 2023-03-06 DIAGNOSIS — R600 Localized edema: Secondary | ICD-10-CM | POA: Diagnosis not present

## 2023-03-09 DIAGNOSIS — M810 Age-related osteoporosis without current pathological fracture: Secondary | ICD-10-CM | POA: Diagnosis not present

## 2023-03-23 DIAGNOSIS — M810 Age-related osteoporosis without current pathological fracture: Secondary | ICD-10-CM | POA: Diagnosis not present

## 2023-04-03 DIAGNOSIS — R2689 Other abnormalities of gait and mobility: Secondary | ICD-10-CM | POA: Diagnosis not present

## 2023-04-03 DIAGNOSIS — R600 Localized edema: Secondary | ICD-10-CM | POA: Diagnosis not present

## 2023-04-03 DIAGNOSIS — R29898 Other symptoms and signs involving the musculoskeletal system: Secondary | ICD-10-CM | POA: Diagnosis not present

## 2023-04-03 DIAGNOSIS — I83893 Varicose veins of bilateral lower extremities with other complications: Secondary | ICD-10-CM | POA: Diagnosis not present

## 2023-04-04 DIAGNOSIS — D519 Vitamin B12 deficiency anemia, unspecified: Secondary | ICD-10-CM | POA: Diagnosis not present

## 2023-04-04 DIAGNOSIS — E785 Hyperlipidemia, unspecified: Secondary | ICD-10-CM | POA: Diagnosis not present

## 2023-04-04 DIAGNOSIS — I4891 Unspecified atrial fibrillation: Secondary | ICD-10-CM | POA: Diagnosis not present

## 2023-04-04 DIAGNOSIS — I10 Essential (primary) hypertension: Secondary | ICD-10-CM | POA: Diagnosis not present

## 2023-04-04 DIAGNOSIS — M810 Age-related osteoporosis without current pathological fracture: Secondary | ICD-10-CM | POA: Diagnosis not present

## 2023-04-04 DIAGNOSIS — E890 Postprocedural hypothyroidism: Secondary | ICD-10-CM | POA: Diagnosis not present

## 2023-04-13 DIAGNOSIS — E876 Hypokalemia: Secondary | ICD-10-CM | POA: Diagnosis not present

## 2023-04-19 DIAGNOSIS — M25569 Pain in unspecified knee: Secondary | ICD-10-CM | POA: Diagnosis not present

## 2023-04-19 DIAGNOSIS — T07XXXA Unspecified multiple injuries, initial encounter: Secondary | ICD-10-CM | POA: Diagnosis not present

## 2023-04-19 DIAGNOSIS — T148XXA Other injury of unspecified body region, initial encounter: Secondary | ICD-10-CM | POA: Diagnosis not present

## 2023-04-21 DIAGNOSIS — S81811A Laceration without foreign body, right lower leg, initial encounter: Secondary | ICD-10-CM | POA: Diagnosis not present

## 2023-04-21 DIAGNOSIS — S81012A Laceration without foreign body, left knee, initial encounter: Secondary | ICD-10-CM | POA: Diagnosis not present

## 2023-04-21 DIAGNOSIS — M25562 Pain in left knee: Secondary | ICD-10-CM | POA: Diagnosis not present

## 2023-04-21 DIAGNOSIS — W19XXXA Unspecified fall, initial encounter: Secondary | ICD-10-CM | POA: Diagnosis not present

## 2023-04-22 DIAGNOSIS — R531 Weakness: Secondary | ICD-10-CM | POA: Diagnosis not present

## 2023-04-22 DIAGNOSIS — R0902 Hypoxemia: Secondary | ICD-10-CM | POA: Diagnosis not present

## 2023-04-22 DIAGNOSIS — G9341 Metabolic encephalopathy: Secondary | ICD-10-CM | POA: Diagnosis not present

## 2023-04-22 DIAGNOSIS — Z792 Long term (current) use of antibiotics: Secondary | ICD-10-CM | POA: Diagnosis not present

## 2023-04-22 DIAGNOSIS — R2681 Unsteadiness on feet: Secondary | ICD-10-CM | POA: Diagnosis not present

## 2023-04-22 DIAGNOSIS — Z741 Need for assistance with personal care: Secondary | ICD-10-CM | POA: Diagnosis not present

## 2023-04-22 DIAGNOSIS — R2689 Other abnormalities of gait and mobility: Secondary | ICD-10-CM | POA: Diagnosis not present

## 2023-04-22 DIAGNOSIS — I482 Chronic atrial fibrillation, unspecified: Secondary | ICD-10-CM | POA: Diagnosis not present

## 2023-04-22 DIAGNOSIS — Z79899 Other long term (current) drug therapy: Secondary | ICD-10-CM | POA: Diagnosis not present

## 2023-04-22 DIAGNOSIS — Z85850 Personal history of malignant neoplasm of thyroid: Secondary | ICD-10-CM | POA: Diagnosis not present

## 2023-04-22 DIAGNOSIS — S81012A Laceration without foreign body, left knee, initial encounter: Secondary | ICD-10-CM | POA: Diagnosis not present

## 2023-04-22 DIAGNOSIS — W19XXXA Unspecified fall, initial encounter: Secondary | ICD-10-CM | POA: Diagnosis not present

## 2023-04-22 DIAGNOSIS — Z87440 Personal history of urinary (tract) infections: Secondary | ICD-10-CM | POA: Diagnosis not present

## 2023-04-22 DIAGNOSIS — R600 Localized edema: Secondary | ICD-10-CM | POA: Diagnosis not present

## 2023-04-22 DIAGNOSIS — I10 Essential (primary) hypertension: Secondary | ICD-10-CM | POA: Diagnosis not present

## 2023-04-22 DIAGNOSIS — I4891 Unspecified atrial fibrillation: Secondary | ICD-10-CM | POA: Diagnosis not present

## 2023-04-22 DIAGNOSIS — M25562 Pain in left knee: Secondary | ICD-10-CM | POA: Diagnosis not present

## 2023-04-22 DIAGNOSIS — R41841 Cognitive communication deficit: Secondary | ICD-10-CM | POA: Diagnosis not present

## 2023-04-22 DIAGNOSIS — E7800 Pure hypercholesterolemia, unspecified: Secondary | ICD-10-CM | POA: Diagnosis not present

## 2023-04-22 DIAGNOSIS — I361 Nonrheumatic tricuspid (valve) insufficiency: Secondary | ICD-10-CM | POA: Diagnosis not present

## 2023-04-22 DIAGNOSIS — M6281 Muscle weakness (generalized): Secondary | ICD-10-CM | POA: Diagnosis not present

## 2023-04-22 DIAGNOSIS — Z888 Allergy status to other drugs, medicaments and biological substances status: Secondary | ICD-10-CM | POA: Diagnosis not present

## 2023-04-22 DIAGNOSIS — Z882 Allergy status to sulfonamides status: Secondary | ICD-10-CM | POA: Diagnosis not present

## 2023-04-22 DIAGNOSIS — G459 Transient cerebral ischemic attack, unspecified: Secondary | ICD-10-CM | POA: Diagnosis not present

## 2023-04-22 DIAGNOSIS — I110 Hypertensive heart disease with heart failure: Secondary | ICD-10-CM | POA: Diagnosis not present

## 2023-04-22 DIAGNOSIS — E876 Hypokalemia: Secondary | ICD-10-CM | POA: Diagnosis not present

## 2023-04-22 DIAGNOSIS — R410 Disorientation, unspecified: Secondary | ICD-10-CM | POA: Diagnosis not present

## 2023-04-22 DIAGNOSIS — I351 Nonrheumatic aortic (valve) insufficiency: Secondary | ICD-10-CM | POA: Diagnosis not present

## 2023-04-22 DIAGNOSIS — R296 Repeated falls: Secondary | ICD-10-CM | POA: Diagnosis not present

## 2023-04-22 DIAGNOSIS — M1990 Unspecified osteoarthritis, unspecified site: Secondary | ICD-10-CM | POA: Diagnosis not present

## 2023-04-22 DIAGNOSIS — I5032 Chronic diastolic (congestive) heart failure: Secondary | ICD-10-CM | POA: Diagnosis not present

## 2023-04-22 DIAGNOSIS — R4182 Altered mental status, unspecified: Secondary | ICD-10-CM | POA: Diagnosis not present

## 2023-04-22 DIAGNOSIS — E039 Hypothyroidism, unspecified: Secondary | ICD-10-CM | POA: Diagnosis not present

## 2023-04-22 DIAGNOSIS — Z7401 Bed confinement status: Secondary | ICD-10-CM | POA: Diagnosis not present

## 2023-04-22 DIAGNOSIS — N390 Urinary tract infection, site not specified: Secondary | ICD-10-CM | POA: Diagnosis not present

## 2023-04-22 DIAGNOSIS — N3000 Acute cystitis without hematuria: Secondary | ICD-10-CM | POA: Diagnosis not present

## 2023-04-22 DIAGNOSIS — Z7902 Long term (current) use of antithrombotics/antiplatelets: Secondary | ICD-10-CM | POA: Diagnosis not present

## 2023-04-22 DIAGNOSIS — F419 Anxiety disorder, unspecified: Secondary | ICD-10-CM | POA: Diagnosis not present

## 2023-04-22 DIAGNOSIS — Z7982 Long term (current) use of aspirin: Secondary | ICD-10-CM | POA: Diagnosis not present

## 2023-04-26 DIAGNOSIS — Z7401 Bed confinement status: Secondary | ICD-10-CM | POA: Diagnosis not present

## 2023-04-26 DIAGNOSIS — I482 Chronic atrial fibrillation, unspecified: Secondary | ICD-10-CM | POA: Diagnosis not present

## 2023-04-26 DIAGNOSIS — R41841 Cognitive communication deficit: Secondary | ICD-10-CM | POA: Diagnosis not present

## 2023-04-26 DIAGNOSIS — R410 Disorientation, unspecified: Secondary | ICD-10-CM | POA: Diagnosis not present

## 2023-04-26 DIAGNOSIS — R2681 Unsteadiness on feet: Secondary | ICD-10-CM | POA: Diagnosis not present

## 2023-04-26 DIAGNOSIS — M6281 Muscle weakness (generalized): Secondary | ICD-10-CM | POA: Diagnosis not present

## 2023-04-26 DIAGNOSIS — F419 Anxiety disorder, unspecified: Secondary | ICD-10-CM | POA: Diagnosis not present

## 2023-04-26 DIAGNOSIS — R531 Weakness: Secondary | ICD-10-CM | POA: Diagnosis not present

## 2023-04-26 DIAGNOSIS — I361 Nonrheumatic tricuspid (valve) insufficiency: Secondary | ICD-10-CM | POA: Diagnosis not present

## 2023-04-26 DIAGNOSIS — M25562 Pain in left knee: Secondary | ICD-10-CM | POA: Diagnosis not present

## 2023-04-26 DIAGNOSIS — E785 Hyperlipidemia, unspecified: Secondary | ICD-10-CM | POA: Diagnosis not present

## 2023-04-26 DIAGNOSIS — M1990 Unspecified osteoarthritis, unspecified site: Secondary | ICD-10-CM | POA: Diagnosis not present

## 2023-04-26 DIAGNOSIS — R2689 Other abnormalities of gait and mobility: Secondary | ICD-10-CM | POA: Diagnosis not present

## 2023-04-26 DIAGNOSIS — E876 Hypokalemia: Secondary | ICD-10-CM | POA: Diagnosis not present

## 2023-04-26 DIAGNOSIS — Z741 Need for assistance with personal care: Secondary | ICD-10-CM | POA: Diagnosis not present

## 2023-04-26 DIAGNOSIS — R569 Unspecified convulsions: Secondary | ICD-10-CM | POA: Diagnosis not present

## 2023-04-26 DIAGNOSIS — I351 Nonrheumatic aortic (valve) insufficiency: Secondary | ICD-10-CM | POA: Diagnosis not present

## 2023-04-26 DIAGNOSIS — N390 Urinary tract infection, site not specified: Secondary | ICD-10-CM | POA: Diagnosis not present

## 2023-04-26 DIAGNOSIS — E039 Hypothyroidism, unspecified: Secondary | ICD-10-CM | POA: Diagnosis not present

## 2023-04-26 DIAGNOSIS — E7800 Pure hypercholesterolemia, unspecified: Secondary | ICD-10-CM | POA: Diagnosis not present

## 2023-04-26 DIAGNOSIS — I5032 Chronic diastolic (congestive) heart failure: Secondary | ICD-10-CM | POA: Diagnosis not present

## 2023-04-26 DIAGNOSIS — G9341 Metabolic encephalopathy: Secondary | ICD-10-CM | POA: Diagnosis not present

## 2023-04-26 DIAGNOSIS — G459 Transient cerebral ischemic attack, unspecified: Secondary | ICD-10-CM | POA: Diagnosis not present

## 2023-04-26 DIAGNOSIS — I10 Essential (primary) hypertension: Secondary | ICD-10-CM | POA: Diagnosis not present

## 2023-05-02 DIAGNOSIS — E785 Hyperlipidemia, unspecified: Secondary | ICD-10-CM | POA: Diagnosis not present

## 2023-05-02 DIAGNOSIS — I10 Essential (primary) hypertension: Secondary | ICD-10-CM | POA: Diagnosis not present

## 2023-05-02 DIAGNOSIS — R569 Unspecified convulsions: Secondary | ICD-10-CM | POA: Diagnosis not present

## 2023-05-11 DIAGNOSIS — R569 Unspecified convulsions: Secondary | ICD-10-CM | POA: Diagnosis not present

## 2023-05-11 DIAGNOSIS — I10 Essential (primary) hypertension: Secondary | ICD-10-CM | POA: Diagnosis not present

## 2023-05-11 DIAGNOSIS — E785 Hyperlipidemia, unspecified: Secondary | ICD-10-CM | POA: Diagnosis not present

## 2023-05-15 DIAGNOSIS — M6259 Muscle wasting and atrophy, not elsewhere classified, multiple sites: Secondary | ICD-10-CM | POA: Diagnosis not present

## 2023-05-15 DIAGNOSIS — R278 Other lack of coordination: Secondary | ICD-10-CM | POA: Diagnosis not present

## 2023-05-15 DIAGNOSIS — Z9181 History of falling: Secondary | ICD-10-CM | POA: Diagnosis not present

## 2023-05-15 DIAGNOSIS — M6281 Muscle weakness (generalized): Secondary | ICD-10-CM | POA: Diagnosis not present

## 2023-05-15 DIAGNOSIS — E876 Hypokalemia: Secondary | ICD-10-CM | POA: Diagnosis not present

## 2023-05-15 DIAGNOSIS — R262 Difficulty in walking, not elsewhere classified: Secondary | ICD-10-CM | POA: Diagnosis not present

## 2023-05-15 DIAGNOSIS — R2681 Unsteadiness on feet: Secondary | ICD-10-CM | POA: Diagnosis not present

## 2023-05-16 DIAGNOSIS — R2681 Unsteadiness on feet: Secondary | ICD-10-CM | POA: Diagnosis not present

## 2023-05-16 DIAGNOSIS — M6259 Muscle wasting and atrophy, not elsewhere classified, multiple sites: Secondary | ICD-10-CM | POA: Diagnosis not present

## 2023-05-16 DIAGNOSIS — M6281 Muscle weakness (generalized): Secondary | ICD-10-CM | POA: Diagnosis not present

## 2023-05-16 DIAGNOSIS — E785 Hyperlipidemia, unspecified: Secondary | ICD-10-CM | POA: Diagnosis not present

## 2023-05-16 DIAGNOSIS — E876 Hypokalemia: Secondary | ICD-10-CM | POA: Diagnosis not present

## 2023-05-16 DIAGNOSIS — R278 Other lack of coordination: Secondary | ICD-10-CM | POA: Diagnosis not present

## 2023-05-16 DIAGNOSIS — R262 Difficulty in walking, not elsewhere classified: Secondary | ICD-10-CM | POA: Diagnosis not present

## 2023-05-16 DIAGNOSIS — R569 Unspecified convulsions: Secondary | ICD-10-CM | POA: Diagnosis not present

## 2023-05-16 DIAGNOSIS — Z9181 History of falling: Secondary | ICD-10-CM | POA: Diagnosis not present

## 2023-05-16 DIAGNOSIS — I10 Essential (primary) hypertension: Secondary | ICD-10-CM | POA: Diagnosis not present

## 2023-05-17 DIAGNOSIS — R41841 Cognitive communication deficit: Secondary | ICD-10-CM | POA: Diagnosis not present

## 2023-05-17 DIAGNOSIS — E7800 Pure hypercholesterolemia, unspecified: Secondary | ICD-10-CM | POA: Diagnosis not present

## 2023-05-17 DIAGNOSIS — I5032 Chronic diastolic (congestive) heart failure: Secondary | ICD-10-CM | POA: Diagnosis not present

## 2023-05-17 DIAGNOSIS — M1990 Unspecified osteoarthritis, unspecified site: Secondary | ICD-10-CM | POA: Diagnosis not present

## 2023-05-17 DIAGNOSIS — M791 Myalgia, unspecified site: Secondary | ICD-10-CM | POA: Diagnosis not present

## 2023-05-17 DIAGNOSIS — I082 Rheumatic disorders of both aortic and tricuspid valves: Secondary | ICD-10-CM | POA: Diagnosis not present

## 2023-05-17 DIAGNOSIS — Z9181 History of falling: Secondary | ICD-10-CM | POA: Diagnosis not present

## 2023-05-17 DIAGNOSIS — S81812D Laceration without foreign body, left lower leg, subsequent encounter: Secondary | ICD-10-CM | POA: Diagnosis not present

## 2023-05-17 DIAGNOSIS — Z8673 Personal history of transient ischemic attack (TIA), and cerebral infarction without residual deficits: Secondary | ICD-10-CM | POA: Diagnosis not present

## 2023-05-17 DIAGNOSIS — I4891 Unspecified atrial fibrillation: Secondary | ICD-10-CM | POA: Diagnosis not present

## 2023-05-17 DIAGNOSIS — E039 Hypothyroidism, unspecified: Secondary | ICD-10-CM | POA: Diagnosis not present

## 2023-05-17 DIAGNOSIS — R569 Unspecified convulsions: Secondary | ICD-10-CM | POA: Diagnosis not present

## 2023-05-17 DIAGNOSIS — Z7902 Long term (current) use of antithrombotics/antiplatelets: Secondary | ICD-10-CM | POA: Diagnosis not present

## 2023-05-17 DIAGNOSIS — K219 Gastro-esophageal reflux disease without esophagitis: Secondary | ICD-10-CM | POA: Diagnosis not present

## 2023-05-17 DIAGNOSIS — F419 Anxiety disorder, unspecified: Secondary | ICD-10-CM | POA: Diagnosis not present

## 2023-05-17 DIAGNOSIS — N390 Urinary tract infection, site not specified: Secondary | ICD-10-CM | POA: Diagnosis not present

## 2023-05-17 DIAGNOSIS — I110 Hypertensive heart disease with heart failure: Secondary | ICD-10-CM | POA: Diagnosis not present

## 2023-05-18 DIAGNOSIS — Z9181 History of falling: Secondary | ICD-10-CM | POA: Diagnosis not present

## 2023-05-18 DIAGNOSIS — I5032 Chronic diastolic (congestive) heart failure: Secondary | ICD-10-CM | POA: Diagnosis not present

## 2023-05-18 DIAGNOSIS — M791 Myalgia, unspecified site: Secondary | ICD-10-CM | POA: Diagnosis not present

## 2023-05-18 DIAGNOSIS — N390 Urinary tract infection, site not specified: Secondary | ICD-10-CM | POA: Diagnosis not present

## 2023-05-18 DIAGNOSIS — M1990 Unspecified osteoarthritis, unspecified site: Secondary | ICD-10-CM | POA: Diagnosis not present

## 2023-05-18 DIAGNOSIS — R569 Unspecified convulsions: Secondary | ICD-10-CM | POA: Diagnosis not present

## 2023-05-18 DIAGNOSIS — I082 Rheumatic disorders of both aortic and tricuspid valves: Secondary | ICD-10-CM | POA: Diagnosis not present

## 2023-05-18 DIAGNOSIS — I4891 Unspecified atrial fibrillation: Secondary | ICD-10-CM | POA: Diagnosis not present

## 2023-05-18 DIAGNOSIS — K219 Gastro-esophageal reflux disease without esophagitis: Secondary | ICD-10-CM | POA: Diagnosis not present

## 2023-05-18 DIAGNOSIS — F419 Anxiety disorder, unspecified: Secondary | ICD-10-CM | POA: Diagnosis not present

## 2023-05-18 DIAGNOSIS — E039 Hypothyroidism, unspecified: Secondary | ICD-10-CM | POA: Diagnosis not present

## 2023-05-18 DIAGNOSIS — I110 Hypertensive heart disease with heart failure: Secondary | ICD-10-CM | POA: Diagnosis not present

## 2023-05-18 DIAGNOSIS — E7800 Pure hypercholesterolemia, unspecified: Secondary | ICD-10-CM | POA: Diagnosis not present

## 2023-05-18 DIAGNOSIS — R41841 Cognitive communication deficit: Secondary | ICD-10-CM | POA: Diagnosis not present

## 2023-05-18 DIAGNOSIS — S81812D Laceration without foreign body, left lower leg, subsequent encounter: Secondary | ICD-10-CM | POA: Diagnosis not present

## 2023-05-18 DIAGNOSIS — Z8673 Personal history of transient ischemic attack (TIA), and cerebral infarction without residual deficits: Secondary | ICD-10-CM | POA: Diagnosis not present

## 2023-05-19 DIAGNOSIS — K219 Gastro-esophageal reflux disease without esophagitis: Secondary | ICD-10-CM | POA: Diagnosis not present

## 2023-05-19 DIAGNOSIS — M791 Myalgia, unspecified site: Secondary | ICD-10-CM | POA: Diagnosis not present

## 2023-05-19 DIAGNOSIS — E039 Hypothyroidism, unspecified: Secondary | ICD-10-CM | POA: Diagnosis not present

## 2023-05-19 DIAGNOSIS — R41841 Cognitive communication deficit: Secondary | ICD-10-CM | POA: Diagnosis not present

## 2023-05-19 DIAGNOSIS — M1990 Unspecified osteoarthritis, unspecified site: Secondary | ICD-10-CM | POA: Diagnosis not present

## 2023-05-19 DIAGNOSIS — E7800 Pure hypercholesterolemia, unspecified: Secondary | ICD-10-CM | POA: Diagnosis not present

## 2023-05-19 DIAGNOSIS — Z8673 Personal history of transient ischemic attack (TIA), and cerebral infarction without residual deficits: Secondary | ICD-10-CM | POA: Diagnosis not present

## 2023-05-19 DIAGNOSIS — I5032 Chronic diastolic (congestive) heart failure: Secondary | ICD-10-CM | POA: Diagnosis not present

## 2023-05-19 DIAGNOSIS — F419 Anxiety disorder, unspecified: Secondary | ICD-10-CM | POA: Diagnosis not present

## 2023-05-19 DIAGNOSIS — I082 Rheumatic disorders of both aortic and tricuspid valves: Secondary | ICD-10-CM | POA: Diagnosis not present

## 2023-05-19 DIAGNOSIS — S81812D Laceration without foreign body, left lower leg, subsequent encounter: Secondary | ICD-10-CM | POA: Diagnosis not present

## 2023-05-19 DIAGNOSIS — N390 Urinary tract infection, site not specified: Secondary | ICD-10-CM | POA: Diagnosis not present

## 2023-05-19 DIAGNOSIS — I110 Hypertensive heart disease with heart failure: Secondary | ICD-10-CM | POA: Diagnosis not present

## 2023-05-19 DIAGNOSIS — Z9181 History of falling: Secondary | ICD-10-CM | POA: Diagnosis not present

## 2023-05-19 DIAGNOSIS — I4891 Unspecified atrial fibrillation: Secondary | ICD-10-CM | POA: Diagnosis not present

## 2023-05-19 DIAGNOSIS — R569 Unspecified convulsions: Secondary | ICD-10-CM | POA: Diagnosis not present

## 2023-05-24 DIAGNOSIS — I082 Rheumatic disorders of both aortic and tricuspid valves: Secondary | ICD-10-CM | POA: Diagnosis not present

## 2023-05-24 DIAGNOSIS — N390 Urinary tract infection, site not specified: Secondary | ICD-10-CM | POA: Diagnosis not present

## 2023-05-24 DIAGNOSIS — F419 Anxiety disorder, unspecified: Secondary | ICD-10-CM | POA: Diagnosis not present

## 2023-05-24 DIAGNOSIS — E7800 Pure hypercholesterolemia, unspecified: Secondary | ICD-10-CM | POA: Diagnosis not present

## 2023-05-24 DIAGNOSIS — R41841 Cognitive communication deficit: Secondary | ICD-10-CM | POA: Diagnosis not present

## 2023-05-24 DIAGNOSIS — I4891 Unspecified atrial fibrillation: Secondary | ICD-10-CM | POA: Diagnosis not present

## 2023-05-24 DIAGNOSIS — I5032 Chronic diastolic (congestive) heart failure: Secondary | ICD-10-CM | POA: Diagnosis not present

## 2023-05-24 DIAGNOSIS — S81812D Laceration without foreign body, left lower leg, subsequent encounter: Secondary | ICD-10-CM | POA: Diagnosis not present

## 2023-05-24 DIAGNOSIS — K219 Gastro-esophageal reflux disease without esophagitis: Secondary | ICD-10-CM | POA: Diagnosis not present

## 2023-05-24 DIAGNOSIS — E039 Hypothyroidism, unspecified: Secondary | ICD-10-CM | POA: Diagnosis not present

## 2023-05-24 DIAGNOSIS — Z8673 Personal history of transient ischemic attack (TIA), and cerebral infarction without residual deficits: Secondary | ICD-10-CM | POA: Diagnosis not present

## 2023-05-24 DIAGNOSIS — I110 Hypertensive heart disease with heart failure: Secondary | ICD-10-CM | POA: Diagnosis not present

## 2023-05-24 DIAGNOSIS — M791 Myalgia, unspecified site: Secondary | ICD-10-CM | POA: Diagnosis not present

## 2023-05-24 DIAGNOSIS — R569 Unspecified convulsions: Secondary | ICD-10-CM | POA: Diagnosis not present

## 2023-05-24 DIAGNOSIS — Z9181 History of falling: Secondary | ICD-10-CM | POA: Diagnosis not present

## 2023-05-24 DIAGNOSIS — M1990 Unspecified osteoarthritis, unspecified site: Secondary | ICD-10-CM | POA: Diagnosis not present

## 2023-05-25 DIAGNOSIS — N390 Urinary tract infection, site not specified: Secondary | ICD-10-CM | POA: Diagnosis not present

## 2023-05-25 DIAGNOSIS — G9341 Metabolic encephalopathy: Secondary | ICD-10-CM | POA: Diagnosis not present

## 2023-05-25 DIAGNOSIS — R600 Localized edema: Secondary | ICD-10-CM | POA: Diagnosis not present

## 2023-05-25 DIAGNOSIS — E876 Hypokalemia: Secondary | ICD-10-CM | POA: Diagnosis not present

## 2023-05-26 DIAGNOSIS — E039 Hypothyroidism, unspecified: Secondary | ICD-10-CM | POA: Diagnosis not present

## 2023-05-26 DIAGNOSIS — K219 Gastro-esophageal reflux disease without esophagitis: Secondary | ICD-10-CM | POA: Diagnosis not present

## 2023-05-26 DIAGNOSIS — R569 Unspecified convulsions: Secondary | ICD-10-CM | POA: Diagnosis not present

## 2023-05-26 DIAGNOSIS — M1990 Unspecified osteoarthritis, unspecified site: Secondary | ICD-10-CM | POA: Diagnosis not present

## 2023-05-26 DIAGNOSIS — I5032 Chronic diastolic (congestive) heart failure: Secondary | ICD-10-CM | POA: Diagnosis not present

## 2023-05-26 DIAGNOSIS — R41841 Cognitive communication deficit: Secondary | ICD-10-CM | POA: Diagnosis not present

## 2023-05-26 DIAGNOSIS — F419 Anxiety disorder, unspecified: Secondary | ICD-10-CM | POA: Diagnosis not present

## 2023-05-26 DIAGNOSIS — E7800 Pure hypercholesterolemia, unspecified: Secondary | ICD-10-CM | POA: Diagnosis not present

## 2023-05-26 DIAGNOSIS — I110 Hypertensive heart disease with heart failure: Secondary | ICD-10-CM | POA: Diagnosis not present

## 2023-05-26 DIAGNOSIS — Z8673 Personal history of transient ischemic attack (TIA), and cerebral infarction without residual deficits: Secondary | ICD-10-CM | POA: Diagnosis not present

## 2023-05-26 DIAGNOSIS — Z9181 History of falling: Secondary | ICD-10-CM | POA: Diagnosis not present

## 2023-05-26 DIAGNOSIS — I4891 Unspecified atrial fibrillation: Secondary | ICD-10-CM | POA: Diagnosis not present

## 2023-05-26 DIAGNOSIS — S81812D Laceration without foreign body, left lower leg, subsequent encounter: Secondary | ICD-10-CM | POA: Diagnosis not present

## 2023-05-26 DIAGNOSIS — M791 Myalgia, unspecified site: Secondary | ICD-10-CM | POA: Diagnosis not present

## 2023-05-26 DIAGNOSIS — I082 Rheumatic disorders of both aortic and tricuspid valves: Secondary | ICD-10-CM | POA: Diagnosis not present

## 2023-05-26 DIAGNOSIS — N390 Urinary tract infection, site not specified: Secondary | ICD-10-CM | POA: Diagnosis not present

## 2023-05-30 DIAGNOSIS — I5032 Chronic diastolic (congestive) heart failure: Secondary | ICD-10-CM | POA: Diagnosis not present

## 2023-05-30 DIAGNOSIS — Z8673 Personal history of transient ischemic attack (TIA), and cerebral infarction without residual deficits: Secondary | ICD-10-CM | POA: Diagnosis not present

## 2023-05-30 DIAGNOSIS — S81812D Laceration without foreign body, left lower leg, subsequent encounter: Secondary | ICD-10-CM | POA: Diagnosis not present

## 2023-05-30 DIAGNOSIS — R41841 Cognitive communication deficit: Secondary | ICD-10-CM | POA: Diagnosis not present

## 2023-05-30 DIAGNOSIS — K219 Gastro-esophageal reflux disease without esophagitis: Secondary | ICD-10-CM | POA: Diagnosis not present

## 2023-05-30 DIAGNOSIS — M1990 Unspecified osteoarthritis, unspecified site: Secondary | ICD-10-CM | POA: Diagnosis not present

## 2023-05-30 DIAGNOSIS — I4891 Unspecified atrial fibrillation: Secondary | ICD-10-CM | POA: Diagnosis not present

## 2023-05-30 DIAGNOSIS — Z9181 History of falling: Secondary | ICD-10-CM | POA: Diagnosis not present

## 2023-05-30 DIAGNOSIS — I082 Rheumatic disorders of both aortic and tricuspid valves: Secondary | ICD-10-CM | POA: Diagnosis not present

## 2023-05-30 DIAGNOSIS — F419 Anxiety disorder, unspecified: Secondary | ICD-10-CM | POA: Diagnosis not present

## 2023-05-30 DIAGNOSIS — I110 Hypertensive heart disease with heart failure: Secondary | ICD-10-CM | POA: Diagnosis not present

## 2023-05-30 DIAGNOSIS — E039 Hypothyroidism, unspecified: Secondary | ICD-10-CM | POA: Diagnosis not present

## 2023-05-30 DIAGNOSIS — N390 Urinary tract infection, site not specified: Secondary | ICD-10-CM | POA: Diagnosis not present

## 2023-05-30 DIAGNOSIS — R569 Unspecified convulsions: Secondary | ICD-10-CM | POA: Diagnosis not present

## 2023-05-30 DIAGNOSIS — E7800 Pure hypercholesterolemia, unspecified: Secondary | ICD-10-CM | POA: Diagnosis not present

## 2023-05-30 DIAGNOSIS — M791 Myalgia, unspecified site: Secondary | ICD-10-CM | POA: Diagnosis not present

## 2023-06-06 DIAGNOSIS — F419 Anxiety disorder, unspecified: Secondary | ICD-10-CM | POA: Diagnosis not present

## 2023-06-06 DIAGNOSIS — M1990 Unspecified osteoarthritis, unspecified site: Secondary | ICD-10-CM | POA: Diagnosis not present

## 2023-06-06 DIAGNOSIS — Z8673 Personal history of transient ischemic attack (TIA), and cerebral infarction without residual deficits: Secondary | ICD-10-CM | POA: Diagnosis not present

## 2023-06-06 DIAGNOSIS — M791 Myalgia, unspecified site: Secondary | ICD-10-CM | POA: Diagnosis not present

## 2023-06-06 DIAGNOSIS — N390 Urinary tract infection, site not specified: Secondary | ICD-10-CM | POA: Diagnosis not present

## 2023-06-06 DIAGNOSIS — R569 Unspecified convulsions: Secondary | ICD-10-CM | POA: Diagnosis not present

## 2023-06-06 DIAGNOSIS — I110 Hypertensive heart disease with heart failure: Secondary | ICD-10-CM | POA: Diagnosis not present

## 2023-06-06 DIAGNOSIS — K219 Gastro-esophageal reflux disease without esophagitis: Secondary | ICD-10-CM | POA: Diagnosis not present

## 2023-06-06 DIAGNOSIS — Z9181 History of falling: Secondary | ICD-10-CM | POA: Diagnosis not present

## 2023-06-06 DIAGNOSIS — I4891 Unspecified atrial fibrillation: Secondary | ICD-10-CM | POA: Diagnosis not present

## 2023-06-06 DIAGNOSIS — S81812D Laceration without foreign body, left lower leg, subsequent encounter: Secondary | ICD-10-CM | POA: Diagnosis not present

## 2023-06-06 DIAGNOSIS — I082 Rheumatic disorders of both aortic and tricuspid valves: Secondary | ICD-10-CM | POA: Diagnosis not present

## 2023-06-06 DIAGNOSIS — I5032 Chronic diastolic (congestive) heart failure: Secondary | ICD-10-CM | POA: Diagnosis not present

## 2023-06-06 DIAGNOSIS — E7800 Pure hypercholesterolemia, unspecified: Secondary | ICD-10-CM | POA: Diagnosis not present

## 2023-06-06 DIAGNOSIS — R41841 Cognitive communication deficit: Secondary | ICD-10-CM | POA: Diagnosis not present

## 2023-06-06 DIAGNOSIS — E039 Hypothyroidism, unspecified: Secondary | ICD-10-CM | POA: Diagnosis not present

## 2023-06-08 DIAGNOSIS — M1990 Unspecified osteoarthritis, unspecified site: Secondary | ICD-10-CM | POA: Diagnosis not present

## 2023-06-08 DIAGNOSIS — N390 Urinary tract infection, site not specified: Secondary | ICD-10-CM | POA: Diagnosis not present

## 2023-06-08 DIAGNOSIS — M791 Myalgia, unspecified site: Secondary | ICD-10-CM | POA: Diagnosis not present

## 2023-06-08 DIAGNOSIS — I5032 Chronic diastolic (congestive) heart failure: Secondary | ICD-10-CM | POA: Diagnosis not present

## 2023-06-08 DIAGNOSIS — I4891 Unspecified atrial fibrillation: Secondary | ICD-10-CM | POA: Diagnosis not present

## 2023-06-08 DIAGNOSIS — F419 Anxiety disorder, unspecified: Secondary | ICD-10-CM | POA: Diagnosis not present

## 2023-06-08 DIAGNOSIS — Z9181 History of falling: Secondary | ICD-10-CM | POA: Diagnosis not present

## 2023-06-08 DIAGNOSIS — E7800 Pure hypercholesterolemia, unspecified: Secondary | ICD-10-CM | POA: Diagnosis not present

## 2023-06-08 DIAGNOSIS — R569 Unspecified convulsions: Secondary | ICD-10-CM | POA: Diagnosis not present

## 2023-06-08 DIAGNOSIS — I110 Hypertensive heart disease with heart failure: Secondary | ICD-10-CM | POA: Diagnosis not present

## 2023-06-08 DIAGNOSIS — I082 Rheumatic disorders of both aortic and tricuspid valves: Secondary | ICD-10-CM | POA: Diagnosis not present

## 2023-06-08 DIAGNOSIS — S81812D Laceration without foreign body, left lower leg, subsequent encounter: Secondary | ICD-10-CM | POA: Diagnosis not present

## 2023-06-08 DIAGNOSIS — R41841 Cognitive communication deficit: Secondary | ICD-10-CM | POA: Diagnosis not present

## 2023-06-08 DIAGNOSIS — K219 Gastro-esophageal reflux disease without esophagitis: Secondary | ICD-10-CM | POA: Diagnosis not present

## 2023-06-08 DIAGNOSIS — Z8673 Personal history of transient ischemic attack (TIA), and cerebral infarction without residual deficits: Secondary | ICD-10-CM | POA: Diagnosis not present

## 2023-06-08 DIAGNOSIS — E039 Hypothyroidism, unspecified: Secondary | ICD-10-CM | POA: Diagnosis not present

## 2023-06-15 DIAGNOSIS — K219 Gastro-esophageal reflux disease without esophagitis: Secondary | ICD-10-CM | POA: Diagnosis not present

## 2023-06-15 DIAGNOSIS — R569 Unspecified convulsions: Secondary | ICD-10-CM | POA: Diagnosis not present

## 2023-06-15 DIAGNOSIS — S81812D Laceration without foreign body, left lower leg, subsequent encounter: Secondary | ICD-10-CM | POA: Diagnosis not present

## 2023-06-15 DIAGNOSIS — M1990 Unspecified osteoarthritis, unspecified site: Secondary | ICD-10-CM | POA: Diagnosis not present

## 2023-06-15 DIAGNOSIS — N390 Urinary tract infection, site not specified: Secondary | ICD-10-CM | POA: Diagnosis not present

## 2023-06-15 DIAGNOSIS — Z8673 Personal history of transient ischemic attack (TIA), and cerebral infarction without residual deficits: Secondary | ICD-10-CM | POA: Diagnosis not present

## 2023-06-15 DIAGNOSIS — M791 Myalgia, unspecified site: Secondary | ICD-10-CM | POA: Diagnosis not present

## 2023-06-15 DIAGNOSIS — I4891 Unspecified atrial fibrillation: Secondary | ICD-10-CM | POA: Diagnosis not present

## 2023-06-15 DIAGNOSIS — R41841 Cognitive communication deficit: Secondary | ICD-10-CM | POA: Diagnosis not present

## 2023-06-15 DIAGNOSIS — F419 Anxiety disorder, unspecified: Secondary | ICD-10-CM | POA: Diagnosis not present

## 2023-06-15 DIAGNOSIS — I110 Hypertensive heart disease with heart failure: Secondary | ICD-10-CM | POA: Diagnosis not present

## 2023-06-15 DIAGNOSIS — I5032 Chronic diastolic (congestive) heart failure: Secondary | ICD-10-CM | POA: Diagnosis not present

## 2023-06-15 DIAGNOSIS — Z9181 History of falling: Secondary | ICD-10-CM | POA: Diagnosis not present

## 2023-06-15 DIAGNOSIS — E039 Hypothyroidism, unspecified: Secondary | ICD-10-CM | POA: Diagnosis not present

## 2023-06-15 DIAGNOSIS — I082 Rheumatic disorders of both aortic and tricuspid valves: Secondary | ICD-10-CM | POA: Diagnosis not present

## 2023-06-15 DIAGNOSIS — E7800 Pure hypercholesterolemia, unspecified: Secondary | ICD-10-CM | POA: Diagnosis not present

## 2023-06-16 DIAGNOSIS — Z7902 Long term (current) use of antithrombotics/antiplatelets: Secondary | ICD-10-CM | POA: Diagnosis not present

## 2023-06-16 DIAGNOSIS — I110 Hypertensive heart disease with heart failure: Secondary | ICD-10-CM | POA: Diagnosis not present

## 2023-06-16 DIAGNOSIS — R569 Unspecified convulsions: Secondary | ICD-10-CM | POA: Diagnosis not present

## 2023-06-16 DIAGNOSIS — I5032 Chronic diastolic (congestive) heart failure: Secondary | ICD-10-CM | POA: Diagnosis not present

## 2023-06-16 DIAGNOSIS — R41841 Cognitive communication deficit: Secondary | ICD-10-CM | POA: Diagnosis not present

## 2023-06-16 DIAGNOSIS — N390 Urinary tract infection, site not specified: Secondary | ICD-10-CM | POA: Diagnosis not present

## 2023-06-16 DIAGNOSIS — I082 Rheumatic disorders of both aortic and tricuspid valves: Secondary | ICD-10-CM | POA: Diagnosis not present

## 2023-06-16 DIAGNOSIS — M791 Myalgia, unspecified site: Secondary | ICD-10-CM | POA: Diagnosis not present

## 2023-06-16 DIAGNOSIS — Z8673 Personal history of transient ischemic attack (TIA), and cerebral infarction without residual deficits: Secondary | ICD-10-CM | POA: Diagnosis not present

## 2023-06-16 DIAGNOSIS — Z9181 History of falling: Secondary | ICD-10-CM | POA: Diagnosis not present

## 2023-06-16 DIAGNOSIS — F419 Anxiety disorder, unspecified: Secondary | ICD-10-CM | POA: Diagnosis not present

## 2023-06-16 DIAGNOSIS — E039 Hypothyroidism, unspecified: Secondary | ICD-10-CM | POA: Diagnosis not present

## 2023-06-16 DIAGNOSIS — S81812D Laceration without foreign body, left lower leg, subsequent encounter: Secondary | ICD-10-CM | POA: Diagnosis not present

## 2023-06-16 DIAGNOSIS — K219 Gastro-esophageal reflux disease without esophagitis: Secondary | ICD-10-CM | POA: Diagnosis not present

## 2023-06-16 DIAGNOSIS — I4891 Unspecified atrial fibrillation: Secondary | ICD-10-CM | POA: Diagnosis not present

## 2023-06-16 DIAGNOSIS — E7800 Pure hypercholesterolemia, unspecified: Secondary | ICD-10-CM | POA: Diagnosis not present

## 2023-06-16 DIAGNOSIS — M1990 Unspecified osteoarthritis, unspecified site: Secondary | ICD-10-CM | POA: Diagnosis not present

## 2023-06-22 DIAGNOSIS — I110 Hypertensive heart disease with heart failure: Secondary | ICD-10-CM | POA: Diagnosis not present

## 2023-06-22 DIAGNOSIS — I5032 Chronic diastolic (congestive) heart failure: Secondary | ICD-10-CM | POA: Diagnosis not present

## 2023-06-22 DIAGNOSIS — Z9181 History of falling: Secondary | ICD-10-CM | POA: Diagnosis not present

## 2023-06-22 DIAGNOSIS — K219 Gastro-esophageal reflux disease without esophagitis: Secondary | ICD-10-CM | POA: Diagnosis not present

## 2023-06-22 DIAGNOSIS — M1990 Unspecified osteoarthritis, unspecified site: Secondary | ICD-10-CM | POA: Diagnosis not present

## 2023-06-22 DIAGNOSIS — E7800 Pure hypercholesterolemia, unspecified: Secondary | ICD-10-CM | POA: Diagnosis not present

## 2023-06-22 DIAGNOSIS — F419 Anxiety disorder, unspecified: Secondary | ICD-10-CM | POA: Diagnosis not present

## 2023-06-22 DIAGNOSIS — E039 Hypothyroidism, unspecified: Secondary | ICD-10-CM | POA: Diagnosis not present

## 2023-06-22 DIAGNOSIS — S81812D Laceration without foreign body, left lower leg, subsequent encounter: Secondary | ICD-10-CM | POA: Diagnosis not present

## 2023-06-22 DIAGNOSIS — I4891 Unspecified atrial fibrillation: Secondary | ICD-10-CM | POA: Diagnosis not present

## 2023-06-22 DIAGNOSIS — M791 Myalgia, unspecified site: Secondary | ICD-10-CM | POA: Diagnosis not present

## 2023-06-22 DIAGNOSIS — R41841 Cognitive communication deficit: Secondary | ICD-10-CM | POA: Diagnosis not present

## 2023-06-22 DIAGNOSIS — I082 Rheumatic disorders of both aortic and tricuspid valves: Secondary | ICD-10-CM | POA: Diagnosis not present

## 2023-06-22 DIAGNOSIS — R569 Unspecified convulsions: Secondary | ICD-10-CM | POA: Diagnosis not present

## 2023-06-22 DIAGNOSIS — N390 Urinary tract infection, site not specified: Secondary | ICD-10-CM | POA: Diagnosis not present

## 2023-06-22 DIAGNOSIS — Z8673 Personal history of transient ischemic attack (TIA), and cerebral infarction without residual deficits: Secondary | ICD-10-CM | POA: Diagnosis not present

## 2023-06-28 DIAGNOSIS — F419 Anxiety disorder, unspecified: Secondary | ICD-10-CM | POA: Diagnosis not present

## 2023-06-28 DIAGNOSIS — M791 Myalgia, unspecified site: Secondary | ICD-10-CM | POA: Diagnosis not present

## 2023-06-28 DIAGNOSIS — R569 Unspecified convulsions: Secondary | ICD-10-CM | POA: Diagnosis not present

## 2023-06-28 DIAGNOSIS — S81812D Laceration without foreign body, left lower leg, subsequent encounter: Secondary | ICD-10-CM | POA: Diagnosis not present

## 2023-06-28 DIAGNOSIS — I4891 Unspecified atrial fibrillation: Secondary | ICD-10-CM | POA: Diagnosis not present

## 2023-06-28 DIAGNOSIS — E7800 Pure hypercholesterolemia, unspecified: Secondary | ICD-10-CM | POA: Diagnosis not present

## 2023-06-28 DIAGNOSIS — Z9181 History of falling: Secondary | ICD-10-CM | POA: Diagnosis not present

## 2023-06-28 DIAGNOSIS — R41841 Cognitive communication deficit: Secondary | ICD-10-CM | POA: Diagnosis not present

## 2023-06-28 DIAGNOSIS — I082 Rheumatic disorders of both aortic and tricuspid valves: Secondary | ICD-10-CM | POA: Diagnosis not present

## 2023-06-28 DIAGNOSIS — I110 Hypertensive heart disease with heart failure: Secondary | ICD-10-CM | POA: Diagnosis not present

## 2023-06-28 DIAGNOSIS — N390 Urinary tract infection, site not specified: Secondary | ICD-10-CM | POA: Diagnosis not present

## 2023-06-28 DIAGNOSIS — I5032 Chronic diastolic (congestive) heart failure: Secondary | ICD-10-CM | POA: Diagnosis not present

## 2023-06-28 DIAGNOSIS — Z8673 Personal history of transient ischemic attack (TIA), and cerebral infarction without residual deficits: Secondary | ICD-10-CM | POA: Diagnosis not present

## 2023-06-28 DIAGNOSIS — E039 Hypothyroidism, unspecified: Secondary | ICD-10-CM | POA: Diagnosis not present

## 2023-06-28 DIAGNOSIS — M1990 Unspecified osteoarthritis, unspecified site: Secondary | ICD-10-CM | POA: Diagnosis not present

## 2023-06-28 DIAGNOSIS — K219 Gastro-esophageal reflux disease without esophagitis: Secondary | ICD-10-CM | POA: Diagnosis not present

## 2023-06-30 DIAGNOSIS — R41841 Cognitive communication deficit: Secondary | ICD-10-CM | POA: Diagnosis not present

## 2023-06-30 DIAGNOSIS — R569 Unspecified convulsions: Secondary | ICD-10-CM | POA: Diagnosis not present

## 2023-06-30 DIAGNOSIS — K219 Gastro-esophageal reflux disease without esophagitis: Secondary | ICD-10-CM | POA: Diagnosis not present

## 2023-06-30 DIAGNOSIS — M1990 Unspecified osteoarthritis, unspecified site: Secondary | ICD-10-CM | POA: Diagnosis not present

## 2023-06-30 DIAGNOSIS — E7800 Pure hypercholesterolemia, unspecified: Secondary | ICD-10-CM | POA: Diagnosis not present

## 2023-06-30 DIAGNOSIS — N390 Urinary tract infection, site not specified: Secondary | ICD-10-CM | POA: Diagnosis not present

## 2023-06-30 DIAGNOSIS — I082 Rheumatic disorders of both aortic and tricuspid valves: Secondary | ICD-10-CM | POA: Diagnosis not present

## 2023-06-30 DIAGNOSIS — I5032 Chronic diastolic (congestive) heart failure: Secondary | ICD-10-CM | POA: Diagnosis not present

## 2023-06-30 DIAGNOSIS — M791 Myalgia, unspecified site: Secondary | ICD-10-CM | POA: Diagnosis not present

## 2023-06-30 DIAGNOSIS — F419 Anxiety disorder, unspecified: Secondary | ICD-10-CM | POA: Diagnosis not present

## 2023-06-30 DIAGNOSIS — Z9181 History of falling: Secondary | ICD-10-CM | POA: Diagnosis not present

## 2023-06-30 DIAGNOSIS — E039 Hypothyroidism, unspecified: Secondary | ICD-10-CM | POA: Diagnosis not present

## 2023-06-30 DIAGNOSIS — Z8673 Personal history of transient ischemic attack (TIA), and cerebral infarction without residual deficits: Secondary | ICD-10-CM | POA: Diagnosis not present

## 2023-06-30 DIAGNOSIS — I4891 Unspecified atrial fibrillation: Secondary | ICD-10-CM | POA: Diagnosis not present

## 2023-06-30 DIAGNOSIS — S81812D Laceration without foreign body, left lower leg, subsequent encounter: Secondary | ICD-10-CM | POA: Diagnosis not present

## 2023-06-30 DIAGNOSIS — I110 Hypertensive heart disease with heart failure: Secondary | ICD-10-CM | POA: Diagnosis not present

## 2023-07-01 DIAGNOSIS — I5032 Chronic diastolic (congestive) heart failure: Secondary | ICD-10-CM | POA: Diagnosis not present

## 2023-07-01 DIAGNOSIS — N390 Urinary tract infection, site not specified: Secondary | ICD-10-CM | POA: Diagnosis not present

## 2023-07-01 DIAGNOSIS — K219 Gastro-esophageal reflux disease without esophagitis: Secondary | ICD-10-CM | POA: Diagnosis not present

## 2023-07-01 DIAGNOSIS — S81812D Laceration without foreign body, left lower leg, subsequent encounter: Secondary | ICD-10-CM | POA: Diagnosis not present

## 2023-07-01 DIAGNOSIS — I110 Hypertensive heart disease with heart failure: Secondary | ICD-10-CM | POA: Diagnosis not present

## 2023-07-01 DIAGNOSIS — I4891 Unspecified atrial fibrillation: Secondary | ICD-10-CM | POA: Diagnosis not present

## 2023-07-01 DIAGNOSIS — R41841 Cognitive communication deficit: Secondary | ICD-10-CM | POA: Diagnosis not present

## 2023-07-01 DIAGNOSIS — Z9181 History of falling: Secondary | ICD-10-CM | POA: Diagnosis not present

## 2023-07-01 DIAGNOSIS — M1990 Unspecified osteoarthritis, unspecified site: Secondary | ICD-10-CM | POA: Diagnosis not present

## 2023-07-01 DIAGNOSIS — R569 Unspecified convulsions: Secondary | ICD-10-CM | POA: Diagnosis not present

## 2023-07-01 DIAGNOSIS — E7800 Pure hypercholesterolemia, unspecified: Secondary | ICD-10-CM | POA: Diagnosis not present

## 2023-07-01 DIAGNOSIS — E039 Hypothyroidism, unspecified: Secondary | ICD-10-CM | POA: Diagnosis not present

## 2023-07-01 DIAGNOSIS — Z8673 Personal history of transient ischemic attack (TIA), and cerebral infarction without residual deficits: Secondary | ICD-10-CM | POA: Diagnosis not present

## 2023-07-01 DIAGNOSIS — M791 Myalgia, unspecified site: Secondary | ICD-10-CM | POA: Diagnosis not present

## 2023-07-01 DIAGNOSIS — I082 Rheumatic disorders of both aortic and tricuspid valves: Secondary | ICD-10-CM | POA: Diagnosis not present

## 2023-07-01 DIAGNOSIS — F419 Anxiety disorder, unspecified: Secondary | ICD-10-CM | POA: Diagnosis not present

## 2023-07-04 DIAGNOSIS — E890 Postprocedural hypothyroidism: Secondary | ICD-10-CM | POA: Diagnosis not present

## 2023-07-04 DIAGNOSIS — I4891 Unspecified atrial fibrillation: Secondary | ICD-10-CM | POA: Diagnosis not present

## 2023-07-04 DIAGNOSIS — K580 Irritable bowel syndrome with diarrhea: Secondary | ICD-10-CM | POA: Diagnosis not present

## 2023-07-04 DIAGNOSIS — D519 Vitamin B12 deficiency anemia, unspecified: Secondary | ICD-10-CM | POA: Diagnosis not present

## 2023-07-04 DIAGNOSIS — E785 Hyperlipidemia, unspecified: Secondary | ICD-10-CM | POA: Diagnosis not present

## 2023-07-04 DIAGNOSIS — I10 Essential (primary) hypertension: Secondary | ICD-10-CM | POA: Diagnosis not present

## 2023-07-07 DIAGNOSIS — I082 Rheumatic disorders of both aortic and tricuspid valves: Secondary | ICD-10-CM | POA: Diagnosis not present

## 2023-07-07 DIAGNOSIS — F419 Anxiety disorder, unspecified: Secondary | ICD-10-CM | POA: Diagnosis not present

## 2023-07-07 DIAGNOSIS — K219 Gastro-esophageal reflux disease without esophagitis: Secondary | ICD-10-CM | POA: Diagnosis not present

## 2023-07-07 DIAGNOSIS — I5032 Chronic diastolic (congestive) heart failure: Secondary | ICD-10-CM | POA: Diagnosis not present

## 2023-07-07 DIAGNOSIS — R41841 Cognitive communication deficit: Secondary | ICD-10-CM | POA: Diagnosis not present

## 2023-07-07 DIAGNOSIS — M791 Myalgia, unspecified site: Secondary | ICD-10-CM | POA: Diagnosis not present

## 2023-07-07 DIAGNOSIS — E7800 Pure hypercholesterolemia, unspecified: Secondary | ICD-10-CM | POA: Diagnosis not present

## 2023-07-07 DIAGNOSIS — I4891 Unspecified atrial fibrillation: Secondary | ICD-10-CM | POA: Diagnosis not present

## 2023-07-07 DIAGNOSIS — Z8673 Personal history of transient ischemic attack (TIA), and cerebral infarction without residual deficits: Secondary | ICD-10-CM | POA: Diagnosis not present

## 2023-07-07 DIAGNOSIS — N390 Urinary tract infection, site not specified: Secondary | ICD-10-CM | POA: Diagnosis not present

## 2023-07-07 DIAGNOSIS — R569 Unspecified convulsions: Secondary | ICD-10-CM | POA: Diagnosis not present

## 2023-07-07 DIAGNOSIS — I110 Hypertensive heart disease with heart failure: Secondary | ICD-10-CM | POA: Diagnosis not present

## 2023-07-07 DIAGNOSIS — S81812D Laceration without foreign body, left lower leg, subsequent encounter: Secondary | ICD-10-CM | POA: Diagnosis not present

## 2023-07-07 DIAGNOSIS — M1990 Unspecified osteoarthritis, unspecified site: Secondary | ICD-10-CM | POA: Diagnosis not present

## 2023-07-07 DIAGNOSIS — E039 Hypothyroidism, unspecified: Secondary | ICD-10-CM | POA: Diagnosis not present

## 2023-07-07 DIAGNOSIS — Z9181 History of falling: Secondary | ICD-10-CM | POA: Diagnosis not present

## 2023-07-10 DIAGNOSIS — S81819A Laceration without foreign body, unspecified lower leg, initial encounter: Secondary | ICD-10-CM | POA: Diagnosis not present

## 2023-07-10 DIAGNOSIS — M25561 Pain in right knee: Secondary | ICD-10-CM | POA: Diagnosis not present

## 2023-07-10 DIAGNOSIS — T148XXA Other injury of unspecified body region, initial encounter: Secondary | ICD-10-CM | POA: Diagnosis not present

## 2023-07-10 DIAGNOSIS — S81011A Laceration without foreign body, right knee, initial encounter: Secondary | ICD-10-CM | POA: Diagnosis not present

## 2023-07-10 DIAGNOSIS — W19XXXA Unspecified fall, initial encounter: Secondary | ICD-10-CM | POA: Diagnosis not present

## 2023-07-11 DIAGNOSIS — M25559 Pain in unspecified hip: Secondary | ICD-10-CM | POA: Diagnosis not present

## 2023-07-11 DIAGNOSIS — S39012D Strain of muscle, fascia and tendon of lower back, subsequent encounter: Secondary | ICD-10-CM | POA: Diagnosis not present

## 2023-07-11 DIAGNOSIS — I4891 Unspecified atrial fibrillation: Secondary | ICD-10-CM | POA: Diagnosis not present

## 2023-07-11 DIAGNOSIS — Y92481 Parking lot as the place of occurrence of the external cause: Secondary | ICD-10-CM | POA: Diagnosis not present

## 2023-07-11 DIAGNOSIS — M545 Low back pain, unspecified: Secondary | ICD-10-CM | POA: Diagnosis not present

## 2023-07-11 DIAGNOSIS — S39012A Strain of muscle, fascia and tendon of lower back, initial encounter: Secondary | ICD-10-CM | POA: Diagnosis not present

## 2023-07-11 DIAGNOSIS — S300XXA Contusion of lower back and pelvis, initial encounter: Secondary | ICD-10-CM | POA: Diagnosis not present

## 2023-07-11 DIAGNOSIS — R000 Tachycardia, unspecified: Secondary | ICD-10-CM | POA: Diagnosis not present

## 2023-07-13 DIAGNOSIS — M791 Myalgia, unspecified site: Secondary | ICD-10-CM | POA: Diagnosis not present

## 2023-07-13 DIAGNOSIS — E039 Hypothyroidism, unspecified: Secondary | ICD-10-CM | POA: Diagnosis not present

## 2023-07-13 DIAGNOSIS — K219 Gastro-esophageal reflux disease without esophagitis: Secondary | ICD-10-CM | POA: Diagnosis not present

## 2023-07-13 DIAGNOSIS — Z9181 History of falling: Secondary | ICD-10-CM | POA: Diagnosis not present

## 2023-07-13 DIAGNOSIS — S81812D Laceration without foreign body, left lower leg, subsequent encounter: Secondary | ICD-10-CM | POA: Diagnosis not present

## 2023-07-13 DIAGNOSIS — E7800 Pure hypercholesterolemia, unspecified: Secondary | ICD-10-CM | POA: Diagnosis not present

## 2023-07-13 DIAGNOSIS — Z8673 Personal history of transient ischemic attack (TIA), and cerebral infarction without residual deficits: Secondary | ICD-10-CM | POA: Diagnosis not present

## 2023-07-13 DIAGNOSIS — R41841 Cognitive communication deficit: Secondary | ICD-10-CM | POA: Diagnosis not present

## 2023-07-13 DIAGNOSIS — I082 Rheumatic disorders of both aortic and tricuspid valves: Secondary | ICD-10-CM | POA: Diagnosis not present

## 2023-07-13 DIAGNOSIS — I4891 Unspecified atrial fibrillation: Secondary | ICD-10-CM | POA: Diagnosis not present

## 2023-07-13 DIAGNOSIS — I5032 Chronic diastolic (congestive) heart failure: Secondary | ICD-10-CM | POA: Diagnosis not present

## 2023-07-13 DIAGNOSIS — I110 Hypertensive heart disease with heart failure: Secondary | ICD-10-CM | POA: Diagnosis not present

## 2023-07-13 DIAGNOSIS — N390 Urinary tract infection, site not specified: Secondary | ICD-10-CM | POA: Diagnosis not present

## 2023-07-13 DIAGNOSIS — M1990 Unspecified osteoarthritis, unspecified site: Secondary | ICD-10-CM | POA: Diagnosis not present

## 2023-07-13 DIAGNOSIS — R569 Unspecified convulsions: Secondary | ICD-10-CM | POA: Diagnosis not present

## 2023-07-13 DIAGNOSIS — F419 Anxiety disorder, unspecified: Secondary | ICD-10-CM | POA: Diagnosis not present

## 2023-07-16 DIAGNOSIS — Z8673 Personal history of transient ischemic attack (TIA), and cerebral infarction without residual deficits: Secondary | ICD-10-CM | POA: Diagnosis not present

## 2023-07-16 DIAGNOSIS — M791 Myalgia, unspecified site: Secondary | ICD-10-CM | POA: Diagnosis not present

## 2023-07-16 DIAGNOSIS — I082 Rheumatic disorders of both aortic and tricuspid valves: Secondary | ICD-10-CM | POA: Diagnosis not present

## 2023-07-16 DIAGNOSIS — Z7902 Long term (current) use of antithrombotics/antiplatelets: Secondary | ICD-10-CM | POA: Diagnosis not present

## 2023-07-16 DIAGNOSIS — F419 Anxiety disorder, unspecified: Secondary | ICD-10-CM | POA: Diagnosis not present

## 2023-07-16 DIAGNOSIS — I110 Hypertensive heart disease with heart failure: Secondary | ICD-10-CM | POA: Diagnosis not present

## 2023-07-16 DIAGNOSIS — K219 Gastro-esophageal reflux disease without esophagitis: Secondary | ICD-10-CM | POA: Diagnosis not present

## 2023-07-16 DIAGNOSIS — Z9181 History of falling: Secondary | ICD-10-CM | POA: Diagnosis not present

## 2023-07-16 DIAGNOSIS — E039 Hypothyroidism, unspecified: Secondary | ICD-10-CM | POA: Diagnosis not present

## 2023-07-16 DIAGNOSIS — M1990 Unspecified osteoarthritis, unspecified site: Secondary | ICD-10-CM | POA: Diagnosis not present

## 2023-07-16 DIAGNOSIS — I5032 Chronic diastolic (congestive) heart failure: Secondary | ICD-10-CM | POA: Diagnosis not present

## 2023-07-16 DIAGNOSIS — S81811D Laceration without foreign body, right lower leg, subsequent encounter: Secondary | ICD-10-CM | POA: Diagnosis not present

## 2023-07-16 DIAGNOSIS — E7800 Pure hypercholesterolemia, unspecified: Secondary | ICD-10-CM | POA: Diagnosis not present

## 2023-07-16 DIAGNOSIS — S81812D Laceration without foreign body, left lower leg, subsequent encounter: Secondary | ICD-10-CM | POA: Diagnosis not present

## 2023-07-16 DIAGNOSIS — R569 Unspecified convulsions: Secondary | ICD-10-CM | POA: Diagnosis not present

## 2023-07-16 DIAGNOSIS — R41841 Cognitive communication deficit: Secondary | ICD-10-CM | POA: Diagnosis not present

## 2023-07-16 DIAGNOSIS — S81012D Laceration without foreign body, left knee, subsequent encounter: Secondary | ICD-10-CM | POA: Diagnosis not present

## 2023-07-16 DIAGNOSIS — I4891 Unspecified atrial fibrillation: Secondary | ICD-10-CM | POA: Diagnosis not present

## 2023-07-18 ENCOUNTER — Telehealth: Payer: Self-pay | Admitting: *Deleted

## 2023-07-18 NOTE — Telephone Encounter (Signed)
Transition Care Management Unsuccessful Follow-up Telephone Call  Date of discharge and from where:  [facility] 07/11/2023  Attempts:  1st Attempt  Reason for unsuccessful TCM follow-up call:  No answer/busy

## 2023-07-19 ENCOUNTER — Telehealth: Payer: Self-pay | Admitting: *Deleted

## 2023-07-19 DIAGNOSIS — F419 Anxiety disorder, unspecified: Secondary | ICD-10-CM | POA: Diagnosis not present

## 2023-07-19 DIAGNOSIS — I4891 Unspecified atrial fibrillation: Secondary | ICD-10-CM | POA: Diagnosis not present

## 2023-07-19 DIAGNOSIS — S81811D Laceration without foreign body, right lower leg, subsequent encounter: Secondary | ICD-10-CM | POA: Diagnosis not present

## 2023-07-19 DIAGNOSIS — R41841 Cognitive communication deficit: Secondary | ICD-10-CM | POA: Diagnosis not present

## 2023-07-19 DIAGNOSIS — Z8673 Personal history of transient ischemic attack (TIA), and cerebral infarction without residual deficits: Secondary | ICD-10-CM | POA: Diagnosis not present

## 2023-07-19 DIAGNOSIS — S81812D Laceration without foreign body, left lower leg, subsequent encounter: Secondary | ICD-10-CM | POA: Diagnosis not present

## 2023-07-19 DIAGNOSIS — E039 Hypothyroidism, unspecified: Secondary | ICD-10-CM | POA: Diagnosis not present

## 2023-07-19 DIAGNOSIS — I082 Rheumatic disorders of both aortic and tricuspid valves: Secondary | ICD-10-CM | POA: Diagnosis not present

## 2023-07-19 DIAGNOSIS — R569 Unspecified convulsions: Secondary | ICD-10-CM | POA: Diagnosis not present

## 2023-07-19 DIAGNOSIS — E7800 Pure hypercholesterolemia, unspecified: Secondary | ICD-10-CM | POA: Diagnosis not present

## 2023-07-19 DIAGNOSIS — S81012D Laceration without foreign body, left knee, subsequent encounter: Secondary | ICD-10-CM | POA: Diagnosis not present

## 2023-07-19 DIAGNOSIS — M1990 Unspecified osteoarthritis, unspecified site: Secondary | ICD-10-CM | POA: Diagnosis not present

## 2023-07-19 DIAGNOSIS — I110 Hypertensive heart disease with heart failure: Secondary | ICD-10-CM | POA: Diagnosis not present

## 2023-07-19 DIAGNOSIS — M791 Myalgia, unspecified site: Secondary | ICD-10-CM | POA: Diagnosis not present

## 2023-07-19 DIAGNOSIS — I5032 Chronic diastolic (congestive) heart failure: Secondary | ICD-10-CM | POA: Diagnosis not present

## 2023-07-19 DIAGNOSIS — K219 Gastro-esophageal reflux disease without esophagitis: Secondary | ICD-10-CM | POA: Diagnosis not present

## 2023-07-19 NOTE — Telephone Encounter (Signed)
Transition Care Management Follow-up Telephone Call Date of discharge and from where: [facility] 07/11/2023 How have you been since you were released from the hospital? Feeling much improved  Any questions or concerns? No  Items Reviewed: Did the pt receive and understand the discharge instructions provided? Yes  Medications obtained and verified? Yes  Other? No  Any new allergies since your discharge? No  Dietary orders reviewed? No Do you have support at home? Yes       Follow up appointments reviewed:  PCP Hospital f/u appt confirmed? Yes  She has already seen the pcp  Are transportation arrangements needed? No  Has a lady that is driving her now and too any appts  If their condition worsens, is the pt aware to call PCP or go to the Emergency Dept.? Yes Was the patient provided with contact information for the PCP's office or ED? Yes Was to pt encouraged to call back with questions or concerns? Yes

## 2023-07-21 DIAGNOSIS — I5032 Chronic diastolic (congestive) heart failure: Secondary | ICD-10-CM | POA: Diagnosis not present

## 2023-07-21 DIAGNOSIS — K219 Gastro-esophageal reflux disease without esophagitis: Secondary | ICD-10-CM | POA: Diagnosis not present

## 2023-07-21 DIAGNOSIS — I110 Hypertensive heart disease with heart failure: Secondary | ICD-10-CM | POA: Diagnosis not present

## 2023-07-21 DIAGNOSIS — R41841 Cognitive communication deficit: Secondary | ICD-10-CM | POA: Diagnosis not present

## 2023-07-21 DIAGNOSIS — M791 Myalgia, unspecified site: Secondary | ICD-10-CM | POA: Diagnosis not present

## 2023-07-21 DIAGNOSIS — I082 Rheumatic disorders of both aortic and tricuspid valves: Secondary | ICD-10-CM | POA: Diagnosis not present

## 2023-07-21 DIAGNOSIS — S81012D Laceration without foreign body, left knee, subsequent encounter: Secondary | ICD-10-CM | POA: Diagnosis not present

## 2023-07-21 DIAGNOSIS — R569 Unspecified convulsions: Secondary | ICD-10-CM | POA: Diagnosis not present

## 2023-07-21 DIAGNOSIS — Z8673 Personal history of transient ischemic attack (TIA), and cerebral infarction without residual deficits: Secondary | ICD-10-CM | POA: Diagnosis not present

## 2023-07-21 DIAGNOSIS — E7800 Pure hypercholesterolemia, unspecified: Secondary | ICD-10-CM | POA: Diagnosis not present

## 2023-07-21 DIAGNOSIS — F419 Anxiety disorder, unspecified: Secondary | ICD-10-CM | POA: Diagnosis not present

## 2023-07-21 DIAGNOSIS — M1990 Unspecified osteoarthritis, unspecified site: Secondary | ICD-10-CM | POA: Diagnosis not present

## 2023-07-21 DIAGNOSIS — I4891 Unspecified atrial fibrillation: Secondary | ICD-10-CM | POA: Diagnosis not present

## 2023-07-21 DIAGNOSIS — S81811D Laceration without foreign body, right lower leg, subsequent encounter: Secondary | ICD-10-CM | POA: Diagnosis not present

## 2023-07-21 DIAGNOSIS — S81812D Laceration without foreign body, left lower leg, subsequent encounter: Secondary | ICD-10-CM | POA: Diagnosis not present

## 2023-07-21 DIAGNOSIS — E039 Hypothyroidism, unspecified: Secondary | ICD-10-CM | POA: Diagnosis not present

## 2023-07-24 DIAGNOSIS — I082 Rheumatic disorders of both aortic and tricuspid valves: Secondary | ICD-10-CM | POA: Diagnosis not present

## 2023-07-24 DIAGNOSIS — K219 Gastro-esophageal reflux disease without esophagitis: Secondary | ICD-10-CM | POA: Diagnosis not present

## 2023-07-24 DIAGNOSIS — I4891 Unspecified atrial fibrillation: Secondary | ICD-10-CM | POA: Diagnosis not present

## 2023-07-24 DIAGNOSIS — I5032 Chronic diastolic (congestive) heart failure: Secondary | ICD-10-CM | POA: Diagnosis not present

## 2023-07-24 DIAGNOSIS — M791 Myalgia, unspecified site: Secondary | ICD-10-CM | POA: Diagnosis not present

## 2023-07-24 DIAGNOSIS — E039 Hypothyroidism, unspecified: Secondary | ICD-10-CM | POA: Diagnosis not present

## 2023-07-24 DIAGNOSIS — S81012D Laceration without foreign body, left knee, subsequent encounter: Secondary | ICD-10-CM | POA: Diagnosis not present

## 2023-07-24 DIAGNOSIS — S81812D Laceration without foreign body, left lower leg, subsequent encounter: Secondary | ICD-10-CM | POA: Diagnosis not present

## 2023-07-24 DIAGNOSIS — S81811D Laceration without foreign body, right lower leg, subsequent encounter: Secondary | ICD-10-CM | POA: Diagnosis not present

## 2023-07-24 DIAGNOSIS — M1990 Unspecified osteoarthritis, unspecified site: Secondary | ICD-10-CM | POA: Diagnosis not present

## 2023-07-24 DIAGNOSIS — Z8673 Personal history of transient ischemic attack (TIA), and cerebral infarction without residual deficits: Secondary | ICD-10-CM | POA: Diagnosis not present

## 2023-07-24 DIAGNOSIS — R41841 Cognitive communication deficit: Secondary | ICD-10-CM | POA: Diagnosis not present

## 2023-07-24 DIAGNOSIS — F419 Anxiety disorder, unspecified: Secondary | ICD-10-CM | POA: Diagnosis not present

## 2023-07-24 DIAGNOSIS — I110 Hypertensive heart disease with heart failure: Secondary | ICD-10-CM | POA: Diagnosis not present

## 2023-07-24 DIAGNOSIS — E7800 Pure hypercholesterolemia, unspecified: Secondary | ICD-10-CM | POA: Diagnosis not present

## 2023-07-24 DIAGNOSIS — R569 Unspecified convulsions: Secondary | ICD-10-CM | POA: Diagnosis not present

## 2023-07-25 DIAGNOSIS — F419 Anxiety disorder, unspecified: Secondary | ICD-10-CM | POA: Diagnosis not present

## 2023-07-25 DIAGNOSIS — Z8673 Personal history of transient ischemic attack (TIA), and cerebral infarction without residual deficits: Secondary | ICD-10-CM | POA: Diagnosis not present

## 2023-07-25 DIAGNOSIS — R569 Unspecified convulsions: Secondary | ICD-10-CM | POA: Diagnosis not present

## 2023-07-25 DIAGNOSIS — I4891 Unspecified atrial fibrillation: Secondary | ICD-10-CM | POA: Diagnosis not present

## 2023-07-25 DIAGNOSIS — S81811D Laceration without foreign body, right lower leg, subsequent encounter: Secondary | ICD-10-CM | POA: Diagnosis not present

## 2023-07-25 DIAGNOSIS — I5032 Chronic diastolic (congestive) heart failure: Secondary | ICD-10-CM | POA: Diagnosis not present

## 2023-07-25 DIAGNOSIS — I082 Rheumatic disorders of both aortic and tricuspid valves: Secondary | ICD-10-CM | POA: Diagnosis not present

## 2023-07-25 DIAGNOSIS — E039 Hypothyroidism, unspecified: Secondary | ICD-10-CM | POA: Diagnosis not present

## 2023-07-25 DIAGNOSIS — R41841 Cognitive communication deficit: Secondary | ICD-10-CM | POA: Diagnosis not present

## 2023-07-25 DIAGNOSIS — M1990 Unspecified osteoarthritis, unspecified site: Secondary | ICD-10-CM | POA: Diagnosis not present

## 2023-07-25 DIAGNOSIS — S81012D Laceration without foreign body, left knee, subsequent encounter: Secondary | ICD-10-CM | POA: Diagnosis not present

## 2023-07-25 DIAGNOSIS — K219 Gastro-esophageal reflux disease without esophagitis: Secondary | ICD-10-CM | POA: Diagnosis not present

## 2023-07-25 DIAGNOSIS — I110 Hypertensive heart disease with heart failure: Secondary | ICD-10-CM | POA: Diagnosis not present

## 2023-07-25 DIAGNOSIS — S81812D Laceration without foreign body, left lower leg, subsequent encounter: Secondary | ICD-10-CM | POA: Diagnosis not present

## 2023-07-25 DIAGNOSIS — M791 Myalgia, unspecified site: Secondary | ICD-10-CM | POA: Diagnosis not present

## 2023-07-25 DIAGNOSIS — E7800 Pure hypercholesterolemia, unspecified: Secondary | ICD-10-CM | POA: Diagnosis not present

## 2023-07-28 DIAGNOSIS — E7800 Pure hypercholesterolemia, unspecified: Secondary | ICD-10-CM | POA: Diagnosis not present

## 2023-07-28 DIAGNOSIS — R569 Unspecified convulsions: Secondary | ICD-10-CM | POA: Diagnosis not present

## 2023-07-28 DIAGNOSIS — F419 Anxiety disorder, unspecified: Secondary | ICD-10-CM | POA: Diagnosis not present

## 2023-07-28 DIAGNOSIS — K219 Gastro-esophageal reflux disease without esophagitis: Secondary | ICD-10-CM | POA: Diagnosis not present

## 2023-07-28 DIAGNOSIS — M1990 Unspecified osteoarthritis, unspecified site: Secondary | ICD-10-CM | POA: Diagnosis not present

## 2023-07-28 DIAGNOSIS — M791 Myalgia, unspecified site: Secondary | ICD-10-CM | POA: Diagnosis not present

## 2023-07-28 DIAGNOSIS — I5032 Chronic diastolic (congestive) heart failure: Secondary | ICD-10-CM | POA: Diagnosis not present

## 2023-07-28 DIAGNOSIS — I4891 Unspecified atrial fibrillation: Secondary | ICD-10-CM | POA: Diagnosis not present

## 2023-07-28 DIAGNOSIS — S81812D Laceration without foreign body, left lower leg, subsequent encounter: Secondary | ICD-10-CM | POA: Diagnosis not present

## 2023-07-28 DIAGNOSIS — R41841 Cognitive communication deficit: Secondary | ICD-10-CM | POA: Diagnosis not present

## 2023-07-28 DIAGNOSIS — E039 Hypothyroidism, unspecified: Secondary | ICD-10-CM | POA: Diagnosis not present

## 2023-07-28 DIAGNOSIS — S81811D Laceration without foreign body, right lower leg, subsequent encounter: Secondary | ICD-10-CM | POA: Diagnosis not present

## 2023-07-28 DIAGNOSIS — I110 Hypertensive heart disease with heart failure: Secondary | ICD-10-CM | POA: Diagnosis not present

## 2023-07-28 DIAGNOSIS — I082 Rheumatic disorders of both aortic and tricuspid valves: Secondary | ICD-10-CM | POA: Diagnosis not present

## 2023-07-28 DIAGNOSIS — Z8673 Personal history of transient ischemic attack (TIA), and cerebral infarction without residual deficits: Secondary | ICD-10-CM | POA: Diagnosis not present

## 2023-07-28 DIAGNOSIS — S81012D Laceration without foreign body, left knee, subsequent encounter: Secondary | ICD-10-CM | POA: Diagnosis not present

## 2023-07-29 DIAGNOSIS — S81012D Laceration without foreign body, left knee, subsequent encounter: Secondary | ICD-10-CM | POA: Diagnosis not present

## 2023-07-29 DIAGNOSIS — R41841 Cognitive communication deficit: Secondary | ICD-10-CM | POA: Diagnosis not present

## 2023-07-29 DIAGNOSIS — I110 Hypertensive heart disease with heart failure: Secondary | ICD-10-CM | POA: Diagnosis not present

## 2023-07-29 DIAGNOSIS — R569 Unspecified convulsions: Secondary | ICD-10-CM | POA: Diagnosis not present

## 2023-07-29 DIAGNOSIS — Z8673 Personal history of transient ischemic attack (TIA), and cerebral infarction without residual deficits: Secondary | ICD-10-CM | POA: Diagnosis not present

## 2023-07-29 DIAGNOSIS — I5032 Chronic diastolic (congestive) heart failure: Secondary | ICD-10-CM | POA: Diagnosis not present

## 2023-07-29 DIAGNOSIS — S81812D Laceration without foreign body, left lower leg, subsequent encounter: Secondary | ICD-10-CM | POA: Diagnosis not present

## 2023-07-29 DIAGNOSIS — I082 Rheumatic disorders of both aortic and tricuspid valves: Secondary | ICD-10-CM | POA: Diagnosis not present

## 2023-07-29 DIAGNOSIS — S81811D Laceration without foreign body, right lower leg, subsequent encounter: Secondary | ICD-10-CM | POA: Diagnosis not present

## 2023-07-29 DIAGNOSIS — E039 Hypothyroidism, unspecified: Secondary | ICD-10-CM | POA: Diagnosis not present

## 2023-07-29 DIAGNOSIS — E7800 Pure hypercholesterolemia, unspecified: Secondary | ICD-10-CM | POA: Diagnosis not present

## 2023-07-29 DIAGNOSIS — M791 Myalgia, unspecified site: Secondary | ICD-10-CM | POA: Diagnosis not present

## 2023-07-29 DIAGNOSIS — I4891 Unspecified atrial fibrillation: Secondary | ICD-10-CM | POA: Diagnosis not present

## 2023-07-29 DIAGNOSIS — K219 Gastro-esophageal reflux disease without esophagitis: Secondary | ICD-10-CM | POA: Diagnosis not present

## 2023-07-29 DIAGNOSIS — M1990 Unspecified osteoarthritis, unspecified site: Secondary | ICD-10-CM | POA: Diagnosis not present

## 2023-07-29 DIAGNOSIS — F419 Anxiety disorder, unspecified: Secondary | ICD-10-CM | POA: Diagnosis not present

## 2023-08-01 DIAGNOSIS — Z8673 Personal history of transient ischemic attack (TIA), and cerebral infarction without residual deficits: Secondary | ICD-10-CM | POA: Diagnosis not present

## 2023-08-01 DIAGNOSIS — I082 Rheumatic disorders of both aortic and tricuspid valves: Secondary | ICD-10-CM | POA: Diagnosis not present

## 2023-08-01 DIAGNOSIS — E7800 Pure hypercholesterolemia, unspecified: Secondary | ICD-10-CM | POA: Diagnosis not present

## 2023-08-01 DIAGNOSIS — E039 Hypothyroidism, unspecified: Secondary | ICD-10-CM | POA: Diagnosis not present

## 2023-08-01 DIAGNOSIS — K219 Gastro-esophageal reflux disease without esophagitis: Secondary | ICD-10-CM | POA: Diagnosis not present

## 2023-08-01 DIAGNOSIS — R569 Unspecified convulsions: Secondary | ICD-10-CM | POA: Diagnosis not present

## 2023-08-01 DIAGNOSIS — S81812D Laceration without foreign body, left lower leg, subsequent encounter: Secondary | ICD-10-CM | POA: Diagnosis not present

## 2023-08-01 DIAGNOSIS — M791 Myalgia, unspecified site: Secondary | ICD-10-CM | POA: Diagnosis not present

## 2023-08-01 DIAGNOSIS — R41841 Cognitive communication deficit: Secondary | ICD-10-CM | POA: Diagnosis not present

## 2023-08-01 DIAGNOSIS — F419 Anxiety disorder, unspecified: Secondary | ICD-10-CM | POA: Diagnosis not present

## 2023-08-01 DIAGNOSIS — S81811D Laceration without foreign body, right lower leg, subsequent encounter: Secondary | ICD-10-CM | POA: Diagnosis not present

## 2023-08-01 DIAGNOSIS — I4891 Unspecified atrial fibrillation: Secondary | ICD-10-CM | POA: Diagnosis not present

## 2023-08-01 DIAGNOSIS — M1990 Unspecified osteoarthritis, unspecified site: Secondary | ICD-10-CM | POA: Diagnosis not present

## 2023-08-01 DIAGNOSIS — I5032 Chronic diastolic (congestive) heart failure: Secondary | ICD-10-CM | POA: Diagnosis not present

## 2023-08-01 DIAGNOSIS — S81012D Laceration without foreign body, left knee, subsequent encounter: Secondary | ICD-10-CM | POA: Diagnosis not present

## 2023-08-01 DIAGNOSIS — I110 Hypertensive heart disease with heart failure: Secondary | ICD-10-CM | POA: Diagnosis not present

## 2023-08-02 ENCOUNTER — Ambulatory Visit: Payer: Medicare Other | Admitting: Podiatry

## 2023-08-02 DIAGNOSIS — M79675 Pain in left toe(s): Secondary | ICD-10-CM

## 2023-08-02 DIAGNOSIS — M79674 Pain in right toe(s): Secondary | ICD-10-CM | POA: Diagnosis not present

## 2023-08-02 DIAGNOSIS — B351 Tinea unguium: Secondary | ICD-10-CM | POA: Diagnosis not present

## 2023-08-02 NOTE — Progress Notes (Signed)
       Subjective:  Patient ID: Jean Ruiz, female    DOB: 1935-04-21,  MRN: [id number]   Jean Ruiz presents to clinic today for:  Chief Complaint  Patient presents with   Nail Problem    Routine Foot Care-nail trim   Thickness and discoloration of left hallux. Unable to trim.   . Patient notes nails are thick, discolored, elongated and painful in shoegear when trying to ambulate.  Notes her left great toenail is very thick and wants to know how to treat it.   PCP is Paulina Fusi, MD.  Past Medical History:  Diagnosis Date   Atrial fibrillation [facility])    CHF (congestive heart failure) (HCC)    Hyperlipidemia    Hypertension    Stroke (HCC)    TIA    Allergies  Allergen Reactions   Ceclor [Cefaclor] Rash   Elemental Sulfur Rash    Review of Systems: Negative except as noted in the HPI.  Objective:  There were no vitals filed for this visit.  Jean Ruiz is a pleasant 88 y.o. female in NAD. AAO x 3.  Vascular Examination: Capillary refill time is 3-5 seconds to toes bilateral. Palpable pedal pulses b/l LE. Digital hair present b/l.  Skin temperature gradient WNL b/l. No varicosities b/l. No cyanosis noted b/l.   Dermatological Examination: Pedal skin with normal turgor, texture and tone b/l. No open wounds. No interdigital macerations b/l. Toenails x9 are 3mm thick (left hallux nail is 7mm thick), discolored, dystrophic with subungual debris. There is pain with compression of the nail plates.  They are elongated x10.    Neurological Examination: Epicritic sensation intact bilateral LE. Vibratory sensation intact bilateral LE.  Assessment/Plan: 1. Pain due to onychomycosis of toenails of both feet     The mycotic toenails were sharply debrided x10 with sterile nail nippers and a power debriding burr to decrease bulk/thickness and length.    Recommend Formula 7 solution BID to toenails.  She'll need to apply it at least for 6-8 months  consistently.  Return in about 3 months (around 11/02/2023) for fungal nail recheck.   Clerance Lav, DPM, FACFAS [facility]     [address] [place], [place] [address]                Office [phone]  Fax [phone]

## 2023-08-03 DIAGNOSIS — R569 Unspecified convulsions: Secondary | ICD-10-CM | POA: Diagnosis not present

## 2023-08-03 DIAGNOSIS — R41841 Cognitive communication deficit: Secondary | ICD-10-CM | POA: Diagnosis not present

## 2023-08-03 DIAGNOSIS — E7800 Pure hypercholesterolemia, unspecified: Secondary | ICD-10-CM | POA: Diagnosis not present

## 2023-08-03 DIAGNOSIS — E039 Hypothyroidism, unspecified: Secondary | ICD-10-CM | POA: Diagnosis not present

## 2023-08-03 DIAGNOSIS — F419 Anxiety disorder, unspecified: Secondary | ICD-10-CM | POA: Diagnosis not present

## 2023-08-03 DIAGNOSIS — M791 Myalgia, unspecified site: Secondary | ICD-10-CM | POA: Diagnosis not present

## 2023-08-03 DIAGNOSIS — I5032 Chronic diastolic (congestive) heart failure: Secondary | ICD-10-CM | POA: Diagnosis not present

## 2023-08-03 DIAGNOSIS — Z8673 Personal history of transient ischemic attack (TIA), and cerebral infarction without residual deficits: Secondary | ICD-10-CM | POA: Diagnosis not present

## 2023-08-03 DIAGNOSIS — M1990 Unspecified osteoarthritis, unspecified site: Secondary | ICD-10-CM | POA: Diagnosis not present

## 2023-08-03 DIAGNOSIS — I4891 Unspecified atrial fibrillation: Secondary | ICD-10-CM | POA: Diagnosis not present

## 2023-08-03 DIAGNOSIS — S81811D Laceration without foreign body, right lower leg, subsequent encounter: Secondary | ICD-10-CM | POA: Diagnosis not present

## 2023-08-03 DIAGNOSIS — K219 Gastro-esophageal reflux disease without esophagitis: Secondary | ICD-10-CM | POA: Diagnosis not present

## 2023-08-03 DIAGNOSIS — I110 Hypertensive heart disease with heart failure: Secondary | ICD-10-CM | POA: Diagnosis not present

## 2023-08-03 DIAGNOSIS — I082 Rheumatic disorders of both aortic and tricuspid valves: Secondary | ICD-10-CM | POA: Diagnosis not present

## 2023-08-03 DIAGNOSIS — S81812D Laceration without foreign body, left lower leg, subsequent encounter: Secondary | ICD-10-CM | POA: Diagnosis not present

## 2023-08-03 DIAGNOSIS — S81012D Laceration without foreign body, left knee, subsequent encounter: Secondary | ICD-10-CM | POA: Diagnosis not present

## 2023-08-10 DIAGNOSIS — S81012D Laceration without foreign body, left knee, subsequent encounter: Secondary | ICD-10-CM | POA: Diagnosis not present

## 2023-08-10 DIAGNOSIS — E039 Hypothyroidism, unspecified: Secondary | ICD-10-CM | POA: Diagnosis not present

## 2023-08-10 DIAGNOSIS — F419 Anxiety disorder, unspecified: Secondary | ICD-10-CM | POA: Diagnosis not present

## 2023-08-10 DIAGNOSIS — M791 Myalgia, unspecified site: Secondary | ICD-10-CM | POA: Diagnosis not present

## 2023-08-10 DIAGNOSIS — I082 Rheumatic disorders of both aortic and tricuspid valves: Secondary | ICD-10-CM | POA: Diagnosis not present

## 2023-08-10 DIAGNOSIS — Z8673 Personal history of transient ischemic attack (TIA), and cerebral infarction without residual deficits: Secondary | ICD-10-CM | POA: Diagnosis not present

## 2023-08-10 DIAGNOSIS — M1990 Unspecified osteoarthritis, unspecified site: Secondary | ICD-10-CM | POA: Diagnosis not present

## 2023-08-10 DIAGNOSIS — R569 Unspecified convulsions: Secondary | ICD-10-CM | POA: Diagnosis not present

## 2023-08-10 DIAGNOSIS — E7800 Pure hypercholesterolemia, unspecified: Secondary | ICD-10-CM | POA: Diagnosis not present

## 2023-08-10 DIAGNOSIS — K219 Gastro-esophageal reflux disease without esophagitis: Secondary | ICD-10-CM | POA: Diagnosis not present

## 2023-08-10 DIAGNOSIS — S81811D Laceration without foreign body, right lower leg, subsequent encounter: Secondary | ICD-10-CM | POA: Diagnosis not present

## 2023-08-10 DIAGNOSIS — I110 Hypertensive heart disease with heart failure: Secondary | ICD-10-CM | POA: Diagnosis not present

## 2023-08-10 DIAGNOSIS — R41841 Cognitive communication deficit: Secondary | ICD-10-CM | POA: Diagnosis not present

## 2023-08-10 DIAGNOSIS — I4891 Unspecified atrial fibrillation: Secondary | ICD-10-CM | POA: Diagnosis not present

## 2023-08-10 DIAGNOSIS — I5032 Chronic diastolic (congestive) heart failure: Secondary | ICD-10-CM | POA: Diagnosis not present

## 2023-08-10 DIAGNOSIS — S81812D Laceration without foreign body, left lower leg, subsequent encounter: Secondary | ICD-10-CM | POA: Diagnosis not present

## 2023-08-15 DIAGNOSIS — Z8673 Personal history of transient ischemic attack (TIA), and cerebral infarction without residual deficits: Secondary | ICD-10-CM | POA: Diagnosis not present

## 2023-08-15 DIAGNOSIS — I5032 Chronic diastolic (congestive) heart failure: Secondary | ICD-10-CM | POA: Diagnosis not present

## 2023-08-15 DIAGNOSIS — I110 Hypertensive heart disease with heart failure: Secondary | ICD-10-CM | POA: Diagnosis not present

## 2023-08-15 DIAGNOSIS — Z7902 Long term (current) use of antithrombotics/antiplatelets: Secondary | ICD-10-CM | POA: Diagnosis not present

## 2023-08-15 DIAGNOSIS — R41841 Cognitive communication deficit: Secondary | ICD-10-CM | POA: Diagnosis not present

## 2023-08-15 DIAGNOSIS — M791 Myalgia, unspecified site: Secondary | ICD-10-CM | POA: Diagnosis not present

## 2023-08-15 DIAGNOSIS — E7800 Pure hypercholesterolemia, unspecified: Secondary | ICD-10-CM | POA: Diagnosis not present

## 2023-08-15 DIAGNOSIS — K219 Gastro-esophageal reflux disease without esophagitis: Secondary | ICD-10-CM | POA: Diagnosis not present

## 2023-08-15 DIAGNOSIS — S81812D Laceration without foreign body, left lower leg, subsequent encounter: Secondary | ICD-10-CM | POA: Diagnosis not present

## 2023-08-15 DIAGNOSIS — I082 Rheumatic disorders of both aortic and tricuspid valves: Secondary | ICD-10-CM | POA: Diagnosis not present

## 2023-08-15 DIAGNOSIS — E039 Hypothyroidism, unspecified: Secondary | ICD-10-CM | POA: Diagnosis not present

## 2023-08-15 DIAGNOSIS — S81012D Laceration without foreign body, left knee, subsequent encounter: Secondary | ICD-10-CM | POA: Diagnosis not present

## 2023-08-15 DIAGNOSIS — I4891 Unspecified atrial fibrillation: Secondary | ICD-10-CM | POA: Diagnosis not present

## 2023-08-15 DIAGNOSIS — S81811D Laceration without foreign body, right lower leg, subsequent encounter: Secondary | ICD-10-CM | POA: Diagnosis not present

## 2023-08-15 DIAGNOSIS — R569 Unspecified convulsions: Secondary | ICD-10-CM | POA: Diagnosis not present

## 2023-08-15 DIAGNOSIS — Z9181 History of falling: Secondary | ICD-10-CM | POA: Diagnosis not present

## 2023-08-15 DIAGNOSIS — M1990 Unspecified osteoarthritis, unspecified site: Secondary | ICD-10-CM | POA: Diagnosis not present

## 2023-08-15 DIAGNOSIS — F419 Anxiety disorder, unspecified: Secondary | ICD-10-CM | POA: Diagnosis not present

## 2023-08-17 DIAGNOSIS — R928 Other abnormal and inconclusive findings on diagnostic imaging of breast: Secondary | ICD-10-CM | POA: Diagnosis not present

## 2023-08-17 DIAGNOSIS — N6325 Unspecified lump in the left breast, overlapping quadrants: Secondary | ICD-10-CM | POA: Diagnosis not present

## 2023-08-17 DIAGNOSIS — N6002 Solitary cyst of left breast: Secondary | ICD-10-CM | POA: Diagnosis not present

## 2023-08-17 DIAGNOSIS — R92333 Mammographic heterogeneous density, bilateral breasts: Secondary | ICD-10-CM | POA: Diagnosis not present

## 2023-08-18 DIAGNOSIS — S81812D Laceration without foreign body, left lower leg, subsequent encounter: Secondary | ICD-10-CM | POA: Diagnosis not present

## 2023-08-18 DIAGNOSIS — I5032 Chronic diastolic (congestive) heart failure: Secondary | ICD-10-CM | POA: Diagnosis not present

## 2023-08-18 DIAGNOSIS — R41841 Cognitive communication deficit: Secondary | ICD-10-CM | POA: Diagnosis not present

## 2023-08-18 DIAGNOSIS — M791 Myalgia, unspecified site: Secondary | ICD-10-CM | POA: Diagnosis not present

## 2023-08-18 DIAGNOSIS — I4891 Unspecified atrial fibrillation: Secondary | ICD-10-CM | POA: Diagnosis not present

## 2023-08-18 DIAGNOSIS — I110 Hypertensive heart disease with heart failure: Secondary | ICD-10-CM | POA: Diagnosis not present

## 2023-08-18 DIAGNOSIS — Z8673 Personal history of transient ischemic attack (TIA), and cerebral infarction without residual deficits: Secondary | ICD-10-CM | POA: Diagnosis not present

## 2023-08-18 DIAGNOSIS — S81811D Laceration without foreign body, right lower leg, subsequent encounter: Secondary | ICD-10-CM | POA: Diagnosis not present

## 2023-08-18 DIAGNOSIS — F419 Anxiety disorder, unspecified: Secondary | ICD-10-CM | POA: Diagnosis not present

## 2023-08-18 DIAGNOSIS — S81012D Laceration without foreign body, left knee, subsequent encounter: Secondary | ICD-10-CM | POA: Diagnosis not present

## 2023-08-18 DIAGNOSIS — M1990 Unspecified osteoarthritis, unspecified site: Secondary | ICD-10-CM | POA: Diagnosis not present

## 2023-08-18 DIAGNOSIS — R569 Unspecified convulsions: Secondary | ICD-10-CM | POA: Diagnosis not present

## 2023-08-18 DIAGNOSIS — I082 Rheumatic disorders of both aortic and tricuspid valves: Secondary | ICD-10-CM | POA: Diagnosis not present

## 2023-08-18 DIAGNOSIS — E039 Hypothyroidism, unspecified: Secondary | ICD-10-CM | POA: Diagnosis not present

## 2023-08-18 DIAGNOSIS — K219 Gastro-esophageal reflux disease without esophagitis: Secondary | ICD-10-CM | POA: Diagnosis not present

## 2023-08-18 DIAGNOSIS — E7800 Pure hypercholesterolemia, unspecified: Secondary | ICD-10-CM | POA: Diagnosis not present

## 2023-08-22 DIAGNOSIS — E7800 Pure hypercholesterolemia, unspecified: Secondary | ICD-10-CM | POA: Diagnosis not present

## 2023-08-22 DIAGNOSIS — S81811D Laceration without foreign body, right lower leg, subsequent encounter: Secondary | ICD-10-CM | POA: Diagnosis not present

## 2023-08-22 DIAGNOSIS — M1990 Unspecified osteoarthritis, unspecified site: Secondary | ICD-10-CM | POA: Diagnosis not present

## 2023-08-22 DIAGNOSIS — R41841 Cognitive communication deficit: Secondary | ICD-10-CM | POA: Diagnosis not present

## 2023-08-22 DIAGNOSIS — F419 Anxiety disorder, unspecified: Secondary | ICD-10-CM | POA: Diagnosis not present

## 2023-08-22 DIAGNOSIS — S81812D Laceration without foreign body, left lower leg, subsequent encounter: Secondary | ICD-10-CM | POA: Diagnosis not present

## 2023-08-22 DIAGNOSIS — I082 Rheumatic disorders of both aortic and tricuspid valves: Secondary | ICD-10-CM | POA: Diagnosis not present

## 2023-08-22 DIAGNOSIS — I4891 Unspecified atrial fibrillation: Secondary | ICD-10-CM | POA: Diagnosis not present

## 2023-08-22 DIAGNOSIS — M791 Myalgia, unspecified site: Secondary | ICD-10-CM | POA: Diagnosis not present

## 2023-08-22 DIAGNOSIS — I5032 Chronic diastolic (congestive) heart failure: Secondary | ICD-10-CM | POA: Diagnosis not present

## 2023-08-22 DIAGNOSIS — Z8673 Personal history of transient ischemic attack (TIA), and cerebral infarction without residual deficits: Secondary | ICD-10-CM | POA: Diagnosis not present

## 2023-08-22 DIAGNOSIS — R569 Unspecified convulsions: Secondary | ICD-10-CM | POA: Diagnosis not present

## 2023-08-22 DIAGNOSIS — I110 Hypertensive heart disease with heart failure: Secondary | ICD-10-CM | POA: Diagnosis not present

## 2023-08-22 DIAGNOSIS — K219 Gastro-esophageal reflux disease without esophagitis: Secondary | ICD-10-CM | POA: Diagnosis not present

## 2023-08-22 DIAGNOSIS — S81012D Laceration without foreign body, left knee, subsequent encounter: Secondary | ICD-10-CM | POA: Diagnosis not present

## 2023-08-22 DIAGNOSIS — E039 Hypothyroidism, unspecified: Secondary | ICD-10-CM | POA: Diagnosis not present

## 2023-08-24 DIAGNOSIS — L658 Other specified nonscarring hair loss: Secondary | ICD-10-CM | POA: Diagnosis not present

## 2023-08-24 DIAGNOSIS — E890 Postprocedural hypothyroidism: Secondary | ICD-10-CM | POA: Diagnosis not present

## 2023-08-25 DIAGNOSIS — Z8673 Personal history of transient ischemic attack (TIA), and cerebral infarction without residual deficits: Secondary | ICD-10-CM | POA: Diagnosis not present

## 2023-08-25 DIAGNOSIS — I110 Hypertensive heart disease with heart failure: Secondary | ICD-10-CM | POA: Diagnosis not present

## 2023-08-25 DIAGNOSIS — R569 Unspecified convulsions: Secondary | ICD-10-CM | POA: Diagnosis not present

## 2023-08-25 DIAGNOSIS — S81012D Laceration without foreign body, left knee, subsequent encounter: Secondary | ICD-10-CM | POA: Diagnosis not present

## 2023-08-25 DIAGNOSIS — I082 Rheumatic disorders of both aortic and tricuspid valves: Secondary | ICD-10-CM | POA: Diagnosis not present

## 2023-08-25 DIAGNOSIS — M1990 Unspecified osteoarthritis, unspecified site: Secondary | ICD-10-CM | POA: Diagnosis not present

## 2023-08-25 DIAGNOSIS — M791 Myalgia, unspecified site: Secondary | ICD-10-CM | POA: Diagnosis not present

## 2023-08-25 DIAGNOSIS — E7800 Pure hypercholesterolemia, unspecified: Secondary | ICD-10-CM | POA: Diagnosis not present

## 2023-08-25 DIAGNOSIS — I4891 Unspecified atrial fibrillation: Secondary | ICD-10-CM | POA: Diagnosis not present

## 2023-08-25 DIAGNOSIS — E039 Hypothyroidism, unspecified: Secondary | ICD-10-CM | POA: Diagnosis not present

## 2023-08-25 DIAGNOSIS — R41841 Cognitive communication deficit: Secondary | ICD-10-CM | POA: Diagnosis not present

## 2023-08-25 DIAGNOSIS — S81812D Laceration without foreign body, left lower leg, subsequent encounter: Secondary | ICD-10-CM | POA: Diagnosis not present

## 2023-08-25 DIAGNOSIS — I5032 Chronic diastolic (congestive) heart failure: Secondary | ICD-10-CM | POA: Diagnosis not present

## 2023-08-25 DIAGNOSIS — K219 Gastro-esophageal reflux disease without esophagitis: Secondary | ICD-10-CM | POA: Diagnosis not present

## 2023-08-25 DIAGNOSIS — F419 Anxiety disorder, unspecified: Secondary | ICD-10-CM | POA: Diagnosis not present

## 2023-08-25 DIAGNOSIS — S81811D Laceration without foreign body, right lower leg, subsequent encounter: Secondary | ICD-10-CM | POA: Diagnosis not present

## 2023-08-29 DIAGNOSIS — I4891 Unspecified atrial fibrillation: Secondary | ICD-10-CM | POA: Diagnosis not present

## 2023-08-29 DIAGNOSIS — E7800 Pure hypercholesterolemia, unspecified: Secondary | ICD-10-CM | POA: Diagnosis not present

## 2023-08-29 DIAGNOSIS — F419 Anxiety disorder, unspecified: Secondary | ICD-10-CM | POA: Diagnosis not present

## 2023-08-29 DIAGNOSIS — S81812D Laceration without foreign body, left lower leg, subsequent encounter: Secondary | ICD-10-CM | POA: Diagnosis not present

## 2023-08-29 DIAGNOSIS — R41841 Cognitive communication deficit: Secondary | ICD-10-CM | POA: Diagnosis not present

## 2023-08-29 DIAGNOSIS — E039 Hypothyroidism, unspecified: Secondary | ICD-10-CM | POA: Diagnosis not present

## 2023-08-29 DIAGNOSIS — R569 Unspecified convulsions: Secondary | ICD-10-CM | POA: Diagnosis not present

## 2023-08-29 DIAGNOSIS — S81811D Laceration without foreign body, right lower leg, subsequent encounter: Secondary | ICD-10-CM | POA: Diagnosis not present

## 2023-08-29 DIAGNOSIS — S81012D Laceration without foreign body, left knee, subsequent encounter: Secondary | ICD-10-CM | POA: Diagnosis not present

## 2023-08-29 DIAGNOSIS — M1990 Unspecified osteoarthritis, unspecified site: Secondary | ICD-10-CM | POA: Diagnosis not present

## 2023-08-29 DIAGNOSIS — Z8673 Personal history of transient ischemic attack (TIA), and cerebral infarction without residual deficits: Secondary | ICD-10-CM | POA: Diagnosis not present

## 2023-08-29 DIAGNOSIS — I110 Hypertensive heart disease with heart failure: Secondary | ICD-10-CM | POA: Diagnosis not present

## 2023-08-29 DIAGNOSIS — K219 Gastro-esophageal reflux disease without esophagitis: Secondary | ICD-10-CM | POA: Diagnosis not present

## 2023-08-29 DIAGNOSIS — I082 Rheumatic disorders of both aortic and tricuspid valves: Secondary | ICD-10-CM | POA: Diagnosis not present

## 2023-08-29 DIAGNOSIS — M791 Myalgia, unspecified site: Secondary | ICD-10-CM | POA: Diagnosis not present

## 2023-08-29 DIAGNOSIS — I5032 Chronic diastolic (congestive) heart failure: Secondary | ICD-10-CM | POA: Diagnosis not present

## 2023-09-01 DIAGNOSIS — M791 Myalgia, unspecified site: Secondary | ICD-10-CM | POA: Diagnosis not present

## 2023-09-01 DIAGNOSIS — I4891 Unspecified atrial fibrillation: Secondary | ICD-10-CM | POA: Diagnosis not present

## 2023-09-01 DIAGNOSIS — I082 Rheumatic disorders of both aortic and tricuspid valves: Secondary | ICD-10-CM | POA: Diagnosis not present

## 2023-09-01 DIAGNOSIS — R569 Unspecified convulsions: Secondary | ICD-10-CM | POA: Diagnosis not present

## 2023-09-01 DIAGNOSIS — S81012D Laceration without foreign body, left knee, subsequent encounter: Secondary | ICD-10-CM | POA: Diagnosis not present

## 2023-09-01 DIAGNOSIS — E039 Hypothyroidism, unspecified: Secondary | ICD-10-CM | POA: Diagnosis not present

## 2023-09-01 DIAGNOSIS — I5032 Chronic diastolic (congestive) heart failure: Secondary | ICD-10-CM | POA: Diagnosis not present

## 2023-09-01 DIAGNOSIS — F419 Anxiety disorder, unspecified: Secondary | ICD-10-CM | POA: Diagnosis not present

## 2023-09-01 DIAGNOSIS — K219 Gastro-esophageal reflux disease without esophagitis: Secondary | ICD-10-CM | POA: Diagnosis not present

## 2023-09-01 DIAGNOSIS — S81812D Laceration without foreign body, left lower leg, subsequent encounter: Secondary | ICD-10-CM | POA: Diagnosis not present

## 2023-09-01 DIAGNOSIS — M1990 Unspecified osteoarthritis, unspecified site: Secondary | ICD-10-CM | POA: Diagnosis not present

## 2023-09-01 DIAGNOSIS — Z8673 Personal history of transient ischemic attack (TIA), and cerebral infarction without residual deficits: Secondary | ICD-10-CM | POA: Diagnosis not present

## 2023-09-01 DIAGNOSIS — R41841 Cognitive communication deficit: Secondary | ICD-10-CM | POA: Diagnosis not present

## 2023-09-01 DIAGNOSIS — S81811D Laceration without foreign body, right lower leg, subsequent encounter: Secondary | ICD-10-CM | POA: Diagnosis not present

## 2023-09-01 DIAGNOSIS — I110 Hypertensive heart disease with heart failure: Secondary | ICD-10-CM | POA: Diagnosis not present

## 2023-09-01 DIAGNOSIS — E7800 Pure hypercholesterolemia, unspecified: Secondary | ICD-10-CM | POA: Diagnosis not present

## 2023-09-04 DIAGNOSIS — I5032 Chronic diastolic (congestive) heart failure: Secondary | ICD-10-CM | POA: Diagnosis not present

## 2023-09-04 DIAGNOSIS — R41841 Cognitive communication deficit: Secondary | ICD-10-CM | POA: Diagnosis not present

## 2023-09-04 DIAGNOSIS — E7800 Pure hypercholesterolemia, unspecified: Secondary | ICD-10-CM | POA: Diagnosis not present

## 2023-09-04 DIAGNOSIS — I082 Rheumatic disorders of both aortic and tricuspid valves: Secondary | ICD-10-CM | POA: Diagnosis not present

## 2023-09-04 DIAGNOSIS — E039 Hypothyroidism, unspecified: Secondary | ICD-10-CM | POA: Diagnosis not present

## 2023-09-04 DIAGNOSIS — M791 Myalgia, unspecified site: Secondary | ICD-10-CM | POA: Diagnosis not present

## 2023-09-04 DIAGNOSIS — F419 Anxiety disorder, unspecified: Secondary | ICD-10-CM | POA: Diagnosis not present

## 2023-09-04 DIAGNOSIS — R569 Unspecified convulsions: Secondary | ICD-10-CM | POA: Diagnosis not present

## 2023-09-04 DIAGNOSIS — I4891 Unspecified atrial fibrillation: Secondary | ICD-10-CM | POA: Diagnosis not present

## 2023-09-04 DIAGNOSIS — S81812D Laceration without foreign body, left lower leg, subsequent encounter: Secondary | ICD-10-CM | POA: Diagnosis not present

## 2023-09-04 DIAGNOSIS — I110 Hypertensive heart disease with heart failure: Secondary | ICD-10-CM | POA: Diagnosis not present

## 2023-09-04 DIAGNOSIS — M1990 Unspecified osteoarthritis, unspecified site: Secondary | ICD-10-CM | POA: Diagnosis not present

## 2023-09-04 DIAGNOSIS — Z8673 Personal history of transient ischemic attack (TIA), and cerebral infarction without residual deficits: Secondary | ICD-10-CM | POA: Diagnosis not present

## 2023-09-04 DIAGNOSIS — K219 Gastro-esophageal reflux disease without esophagitis: Secondary | ICD-10-CM | POA: Diagnosis not present

## 2023-09-04 DIAGNOSIS — S81811D Laceration without foreign body, right lower leg, subsequent encounter: Secondary | ICD-10-CM | POA: Diagnosis not present

## 2023-09-04 DIAGNOSIS — S81012D Laceration without foreign body, left knee, subsequent encounter: Secondary | ICD-10-CM | POA: Diagnosis not present

## 2023-09-05 DIAGNOSIS — M1990 Unspecified osteoarthritis, unspecified site: Secondary | ICD-10-CM | POA: Diagnosis not present

## 2023-09-05 DIAGNOSIS — M791 Myalgia, unspecified site: Secondary | ICD-10-CM | POA: Diagnosis not present

## 2023-09-05 DIAGNOSIS — E039 Hypothyroidism, unspecified: Secondary | ICD-10-CM | POA: Diagnosis not present

## 2023-09-05 DIAGNOSIS — E7800 Pure hypercholesterolemia, unspecified: Secondary | ICD-10-CM | POA: Diagnosis not present

## 2023-09-05 DIAGNOSIS — I4891 Unspecified atrial fibrillation: Secondary | ICD-10-CM | POA: Diagnosis not present

## 2023-09-05 DIAGNOSIS — S81811D Laceration without foreign body, right lower leg, subsequent encounter: Secondary | ICD-10-CM | POA: Diagnosis not present

## 2023-09-05 DIAGNOSIS — R41841 Cognitive communication deficit: Secondary | ICD-10-CM | POA: Diagnosis not present

## 2023-09-05 DIAGNOSIS — K219 Gastro-esophageal reflux disease without esophagitis: Secondary | ICD-10-CM | POA: Diagnosis not present

## 2023-09-05 DIAGNOSIS — I110 Hypertensive heart disease with heart failure: Secondary | ICD-10-CM | POA: Diagnosis not present

## 2023-09-05 DIAGNOSIS — F419 Anxiety disorder, unspecified: Secondary | ICD-10-CM | POA: Diagnosis not present

## 2023-09-05 DIAGNOSIS — S81012D Laceration without foreign body, left knee, subsequent encounter: Secondary | ICD-10-CM | POA: Diagnosis not present

## 2023-09-05 DIAGNOSIS — S81812D Laceration without foreign body, left lower leg, subsequent encounter: Secondary | ICD-10-CM | POA: Diagnosis not present

## 2023-09-05 DIAGNOSIS — I5032 Chronic diastolic (congestive) heart failure: Secondary | ICD-10-CM | POA: Diagnosis not present

## 2023-09-05 DIAGNOSIS — R569 Unspecified convulsions: Secondary | ICD-10-CM | POA: Diagnosis not present

## 2023-09-05 DIAGNOSIS — Z8673 Personal history of transient ischemic attack (TIA), and cerebral infarction without residual deficits: Secondary | ICD-10-CM | POA: Diagnosis not present

## 2023-09-05 DIAGNOSIS — I082 Rheumatic disorders of both aortic and tricuspid valves: Secondary | ICD-10-CM | POA: Diagnosis not present

## 2023-09-11 DIAGNOSIS — Z8673 Personal history of transient ischemic attack (TIA), and cerebral infarction without residual deficits: Secondary | ICD-10-CM | POA: Diagnosis not present

## 2023-09-11 DIAGNOSIS — I110 Hypertensive heart disease with heart failure: Secondary | ICD-10-CM | POA: Diagnosis not present

## 2023-09-11 DIAGNOSIS — R569 Unspecified convulsions: Secondary | ICD-10-CM | POA: Diagnosis not present

## 2023-09-11 DIAGNOSIS — F419 Anxiety disorder, unspecified: Secondary | ICD-10-CM | POA: Diagnosis not present

## 2023-09-11 DIAGNOSIS — S81812D Laceration without foreign body, left lower leg, subsequent encounter: Secondary | ICD-10-CM | POA: Diagnosis not present

## 2023-09-11 DIAGNOSIS — M1990 Unspecified osteoarthritis, unspecified site: Secondary | ICD-10-CM | POA: Diagnosis not present

## 2023-09-11 DIAGNOSIS — I5032 Chronic diastolic (congestive) heart failure: Secondary | ICD-10-CM | POA: Diagnosis not present

## 2023-09-11 DIAGNOSIS — E7800 Pure hypercholesterolemia, unspecified: Secondary | ICD-10-CM | POA: Diagnosis not present

## 2023-09-11 DIAGNOSIS — S81811D Laceration without foreign body, right lower leg, subsequent encounter: Secondary | ICD-10-CM | POA: Diagnosis not present

## 2023-09-11 DIAGNOSIS — S81012D Laceration without foreign body, left knee, subsequent encounter: Secondary | ICD-10-CM | POA: Diagnosis not present

## 2023-09-11 DIAGNOSIS — I082 Rheumatic disorders of both aortic and tricuspid valves: Secondary | ICD-10-CM | POA: Diagnosis not present

## 2023-09-11 DIAGNOSIS — I4891 Unspecified atrial fibrillation: Secondary | ICD-10-CM | POA: Diagnosis not present

## 2023-09-11 DIAGNOSIS — K219 Gastro-esophageal reflux disease without esophagitis: Secondary | ICD-10-CM | POA: Diagnosis not present

## 2023-09-11 DIAGNOSIS — R41841 Cognitive communication deficit: Secondary | ICD-10-CM | POA: Diagnosis not present

## 2023-09-11 DIAGNOSIS — M791 Myalgia, unspecified site: Secondary | ICD-10-CM | POA: Diagnosis not present

## 2023-09-11 DIAGNOSIS — E039 Hypothyroidism, unspecified: Secondary | ICD-10-CM | POA: Diagnosis not present

## 2023-09-12 DIAGNOSIS — M1990 Unspecified osteoarthritis, unspecified site: Secondary | ICD-10-CM | POA: Diagnosis not present

## 2023-09-12 DIAGNOSIS — I082 Rheumatic disorders of both aortic and tricuspid valves: Secondary | ICD-10-CM | POA: Diagnosis not present

## 2023-09-12 DIAGNOSIS — R569 Unspecified convulsions: Secondary | ICD-10-CM | POA: Diagnosis not present

## 2023-09-12 DIAGNOSIS — E039 Hypothyroidism, unspecified: Secondary | ICD-10-CM | POA: Diagnosis not present

## 2023-09-12 DIAGNOSIS — F419 Anxiety disorder, unspecified: Secondary | ICD-10-CM | POA: Diagnosis not present

## 2023-09-12 DIAGNOSIS — Z8673 Personal history of transient ischemic attack (TIA), and cerebral infarction without residual deficits: Secondary | ICD-10-CM | POA: Diagnosis not present

## 2023-09-12 DIAGNOSIS — S81012D Laceration without foreign body, left knee, subsequent encounter: Secondary | ICD-10-CM | POA: Diagnosis not present

## 2023-09-12 DIAGNOSIS — I110 Hypertensive heart disease with heart failure: Secondary | ICD-10-CM | POA: Diagnosis not present

## 2023-09-12 DIAGNOSIS — M791 Myalgia, unspecified site: Secondary | ICD-10-CM | POA: Diagnosis not present

## 2023-09-12 DIAGNOSIS — R41841 Cognitive communication deficit: Secondary | ICD-10-CM | POA: Diagnosis not present

## 2023-09-12 DIAGNOSIS — I5032 Chronic diastolic (congestive) heart failure: Secondary | ICD-10-CM | POA: Diagnosis not present

## 2023-09-12 DIAGNOSIS — K219 Gastro-esophageal reflux disease without esophagitis: Secondary | ICD-10-CM | POA: Diagnosis not present

## 2023-09-12 DIAGNOSIS — S81811D Laceration without foreign body, right lower leg, subsequent encounter: Secondary | ICD-10-CM | POA: Diagnosis not present

## 2023-09-12 DIAGNOSIS — S81812D Laceration without foreign body, left lower leg, subsequent encounter: Secondary | ICD-10-CM | POA: Diagnosis not present

## 2023-09-12 DIAGNOSIS — I4891 Unspecified atrial fibrillation: Secondary | ICD-10-CM | POA: Diagnosis not present

## 2023-09-12 DIAGNOSIS — E7800 Pure hypercholesterolemia, unspecified: Secondary | ICD-10-CM | POA: Diagnosis not present

## 2023-09-13 DIAGNOSIS — M1990 Unspecified osteoarthritis, unspecified site: Secondary | ICD-10-CM | POA: Diagnosis not present

## 2023-09-13 DIAGNOSIS — S81812D Laceration without foreign body, left lower leg, subsequent encounter: Secondary | ICD-10-CM | POA: Diagnosis not present

## 2023-09-13 DIAGNOSIS — Z8673 Personal history of transient ischemic attack (TIA), and cerebral infarction without residual deficits: Secondary | ICD-10-CM | POA: Diagnosis not present

## 2023-09-13 DIAGNOSIS — M791 Myalgia, unspecified site: Secondary | ICD-10-CM | POA: Diagnosis not present

## 2023-09-13 DIAGNOSIS — I110 Hypertensive heart disease with heart failure: Secondary | ICD-10-CM | POA: Diagnosis not present

## 2023-09-13 DIAGNOSIS — E7800 Pure hypercholesterolemia, unspecified: Secondary | ICD-10-CM | POA: Diagnosis not present

## 2023-09-13 DIAGNOSIS — E039 Hypothyroidism, unspecified: Secondary | ICD-10-CM | POA: Diagnosis not present

## 2023-09-13 DIAGNOSIS — K219 Gastro-esophageal reflux disease without esophagitis: Secondary | ICD-10-CM | POA: Diagnosis not present

## 2023-09-13 DIAGNOSIS — F419 Anxiety disorder, unspecified: Secondary | ICD-10-CM | POA: Diagnosis not present

## 2023-09-13 DIAGNOSIS — I082 Rheumatic disorders of both aortic and tricuspid valves: Secondary | ICD-10-CM | POA: Diagnosis not present

## 2023-09-13 DIAGNOSIS — S81811D Laceration without foreign body, right lower leg, subsequent encounter: Secondary | ICD-10-CM | POA: Diagnosis not present

## 2023-09-13 DIAGNOSIS — I5032 Chronic diastolic (congestive) heart failure: Secondary | ICD-10-CM | POA: Diagnosis not present

## 2023-09-13 DIAGNOSIS — I4891 Unspecified atrial fibrillation: Secondary | ICD-10-CM | POA: Diagnosis not present

## 2023-09-13 DIAGNOSIS — R41841 Cognitive communication deficit: Secondary | ICD-10-CM | POA: Diagnosis not present

## 2023-09-13 DIAGNOSIS — R569 Unspecified convulsions: Secondary | ICD-10-CM | POA: Diagnosis not present

## 2023-09-13 DIAGNOSIS — S81012D Laceration without foreign body, left knee, subsequent encounter: Secondary | ICD-10-CM | POA: Diagnosis not present

## 2023-09-14 DIAGNOSIS — I4891 Unspecified atrial fibrillation: Secondary | ICD-10-CM | POA: Diagnosis not present

## 2023-09-14 DIAGNOSIS — F419 Anxiety disorder, unspecified: Secondary | ICD-10-CM | POA: Diagnosis not present

## 2023-09-14 DIAGNOSIS — I5032 Chronic diastolic (congestive) heart failure: Secondary | ICD-10-CM | POA: Diagnosis not present

## 2023-09-14 DIAGNOSIS — Z7902 Long term (current) use of antithrombotics/antiplatelets: Secondary | ICD-10-CM | POA: Diagnosis not present

## 2023-09-14 DIAGNOSIS — K219 Gastro-esophageal reflux disease without esophagitis: Secondary | ICD-10-CM | POA: Diagnosis not present

## 2023-09-14 DIAGNOSIS — E039 Hypothyroidism, unspecified: Secondary | ICD-10-CM | POA: Diagnosis not present

## 2023-09-14 DIAGNOSIS — R41841 Cognitive communication deficit: Secondary | ICD-10-CM | POA: Diagnosis not present

## 2023-09-14 DIAGNOSIS — Z9181 History of falling: Secondary | ICD-10-CM | POA: Diagnosis not present

## 2023-09-14 DIAGNOSIS — M1990 Unspecified osteoarthritis, unspecified site: Secondary | ICD-10-CM | POA: Diagnosis not present

## 2023-09-14 DIAGNOSIS — M791 Myalgia, unspecified site: Secondary | ICD-10-CM | POA: Diagnosis not present

## 2023-09-14 DIAGNOSIS — E7800 Pure hypercholesterolemia, unspecified: Secondary | ICD-10-CM | POA: Diagnosis not present

## 2023-09-14 DIAGNOSIS — I110 Hypertensive heart disease with heart failure: Secondary | ICD-10-CM | POA: Diagnosis not present

## 2023-09-14 DIAGNOSIS — I082 Rheumatic disorders of both aortic and tricuspid valves: Secondary | ICD-10-CM | POA: Diagnosis not present

## 2023-09-14 DIAGNOSIS — Z8673 Personal history of transient ischemic attack (TIA), and cerebral infarction without residual deficits: Secondary | ICD-10-CM | POA: Diagnosis not present

## 2023-09-14 DIAGNOSIS — R569 Unspecified convulsions: Secondary | ICD-10-CM | POA: Diagnosis not present

## 2023-09-21 DIAGNOSIS — Z9181 History of falling: Secondary | ICD-10-CM | POA: Diagnosis not present

## 2023-09-21 DIAGNOSIS — I110 Hypertensive heart disease with heart failure: Secondary | ICD-10-CM | POA: Diagnosis not present

## 2023-09-21 DIAGNOSIS — E039 Hypothyroidism, unspecified: Secondary | ICD-10-CM | POA: Diagnosis not present

## 2023-09-21 DIAGNOSIS — Z7902 Long term (current) use of antithrombotics/antiplatelets: Secondary | ICD-10-CM | POA: Diagnosis not present

## 2023-09-21 DIAGNOSIS — M791 Myalgia, unspecified site: Secondary | ICD-10-CM | POA: Diagnosis not present

## 2023-09-21 DIAGNOSIS — I082 Rheumatic disorders of both aortic and tricuspid valves: Secondary | ICD-10-CM | POA: Diagnosis not present

## 2023-09-21 DIAGNOSIS — M1990 Unspecified osteoarthritis, unspecified site: Secondary | ICD-10-CM | POA: Diagnosis not present

## 2023-09-21 DIAGNOSIS — E876 Hypokalemia: Secondary | ICD-10-CM | POA: Diagnosis not present

## 2023-09-21 DIAGNOSIS — I5032 Chronic diastolic (congestive) heart failure: Secondary | ICD-10-CM | POA: Diagnosis not present

## 2023-09-21 DIAGNOSIS — F419 Anxiety disorder, unspecified: Secondary | ICD-10-CM | POA: Diagnosis not present

## 2023-09-21 DIAGNOSIS — R569 Unspecified convulsions: Secondary | ICD-10-CM | POA: Diagnosis not present

## 2023-09-21 DIAGNOSIS — K219 Gastro-esophageal reflux disease without esophagitis: Secondary | ICD-10-CM | POA: Diagnosis not present

## 2023-09-21 DIAGNOSIS — R41841 Cognitive communication deficit: Secondary | ICD-10-CM | POA: Diagnosis not present

## 2023-09-21 DIAGNOSIS — I4891 Unspecified atrial fibrillation: Secondary | ICD-10-CM | POA: Diagnosis not present

## 2023-09-21 DIAGNOSIS — R29898 Other symptoms and signs involving the musculoskeletal system: Secondary | ICD-10-CM | POA: Diagnosis not present

## 2023-09-21 DIAGNOSIS — E7800 Pure hypercholesterolemia, unspecified: Secondary | ICD-10-CM | POA: Diagnosis not present

## 2023-09-21 DIAGNOSIS — Z8673 Personal history of transient ischemic attack (TIA), and cerebral infarction without residual deficits: Secondary | ICD-10-CM | POA: Diagnosis not present

## 2023-09-29 DIAGNOSIS — R41841 Cognitive communication deficit: Secondary | ICD-10-CM | POA: Diagnosis not present

## 2023-09-29 DIAGNOSIS — I4891 Unspecified atrial fibrillation: Secondary | ICD-10-CM | POA: Diagnosis not present

## 2023-09-29 DIAGNOSIS — K219 Gastro-esophageal reflux disease without esophagitis: Secondary | ICD-10-CM | POA: Diagnosis not present

## 2023-09-29 DIAGNOSIS — M1990 Unspecified osteoarthritis, unspecified site: Secondary | ICD-10-CM | POA: Diagnosis not present

## 2023-09-29 DIAGNOSIS — I082 Rheumatic disorders of both aortic and tricuspid valves: Secondary | ICD-10-CM | POA: Diagnosis not present

## 2023-09-29 DIAGNOSIS — F419 Anxiety disorder, unspecified: Secondary | ICD-10-CM | POA: Diagnosis not present

## 2023-09-29 DIAGNOSIS — E7800 Pure hypercholesterolemia, unspecified: Secondary | ICD-10-CM | POA: Diagnosis not present

## 2023-09-29 DIAGNOSIS — R569 Unspecified convulsions: Secondary | ICD-10-CM | POA: Diagnosis not present

## 2023-09-29 DIAGNOSIS — M791 Myalgia, unspecified site: Secondary | ICD-10-CM | POA: Diagnosis not present

## 2023-09-29 DIAGNOSIS — Z7902 Long term (current) use of antithrombotics/antiplatelets: Secondary | ICD-10-CM | POA: Diagnosis not present

## 2023-09-29 DIAGNOSIS — I5032 Chronic diastolic (congestive) heart failure: Secondary | ICD-10-CM | POA: Diagnosis not present

## 2023-09-29 DIAGNOSIS — E039 Hypothyroidism, unspecified: Secondary | ICD-10-CM | POA: Diagnosis not present

## 2023-09-29 DIAGNOSIS — I110 Hypertensive heart disease with heart failure: Secondary | ICD-10-CM | POA: Diagnosis not present

## 2023-09-29 DIAGNOSIS — Z9181 History of falling: Secondary | ICD-10-CM | POA: Diagnosis not present

## 2023-09-29 DIAGNOSIS — Z8673 Personal history of transient ischemic attack (TIA), and cerebral infarction without residual deficits: Secondary | ICD-10-CM | POA: Diagnosis not present

## 2023-10-06 DIAGNOSIS — M1990 Unspecified osteoarthritis, unspecified site: Secondary | ICD-10-CM | POA: Diagnosis not present

## 2023-10-06 DIAGNOSIS — K219 Gastro-esophageal reflux disease without esophagitis: Secondary | ICD-10-CM | POA: Diagnosis not present

## 2023-10-06 DIAGNOSIS — R41841 Cognitive communication deficit: Secondary | ICD-10-CM | POA: Diagnosis not present

## 2023-10-06 DIAGNOSIS — Z9181 History of falling: Secondary | ICD-10-CM | POA: Diagnosis not present

## 2023-10-06 DIAGNOSIS — E039 Hypothyroidism, unspecified: Secondary | ICD-10-CM | POA: Diagnosis not present

## 2023-10-06 DIAGNOSIS — I4891 Unspecified atrial fibrillation: Secondary | ICD-10-CM | POA: Diagnosis not present

## 2023-10-06 DIAGNOSIS — E7800 Pure hypercholesterolemia, unspecified: Secondary | ICD-10-CM | POA: Diagnosis not present

## 2023-10-06 DIAGNOSIS — Z7902 Long term (current) use of antithrombotics/antiplatelets: Secondary | ICD-10-CM | POA: Diagnosis not present

## 2023-10-06 DIAGNOSIS — Z8673 Personal history of transient ischemic attack (TIA), and cerebral infarction without residual deficits: Secondary | ICD-10-CM | POA: Diagnosis not present

## 2023-10-06 DIAGNOSIS — I5032 Chronic diastolic (congestive) heart failure: Secondary | ICD-10-CM | POA: Diagnosis not present

## 2023-10-06 DIAGNOSIS — I110 Hypertensive heart disease with heart failure: Secondary | ICD-10-CM | POA: Diagnosis not present

## 2023-10-06 DIAGNOSIS — R569 Unspecified convulsions: Secondary | ICD-10-CM | POA: Diagnosis not present

## 2023-10-06 DIAGNOSIS — M791 Myalgia, unspecified site: Secondary | ICD-10-CM | POA: Diagnosis not present

## 2023-10-06 DIAGNOSIS — F419 Anxiety disorder, unspecified: Secondary | ICD-10-CM | POA: Diagnosis not present

## 2023-10-06 DIAGNOSIS — I082 Rheumatic disorders of both aortic and tricuspid valves: Secondary | ICD-10-CM | POA: Diagnosis not present

## 2023-10-12 DIAGNOSIS — I5032 Chronic diastolic (congestive) heart failure: Secondary | ICD-10-CM | POA: Diagnosis not present

## 2023-10-12 DIAGNOSIS — M1990 Unspecified osteoarthritis, unspecified site: Secondary | ICD-10-CM | POA: Diagnosis not present

## 2023-10-12 DIAGNOSIS — Z9181 History of falling: Secondary | ICD-10-CM | POA: Diagnosis not present

## 2023-10-12 DIAGNOSIS — M791 Myalgia, unspecified site: Secondary | ICD-10-CM | POA: Diagnosis not present

## 2023-10-12 DIAGNOSIS — R41841 Cognitive communication deficit: Secondary | ICD-10-CM | POA: Diagnosis not present

## 2023-10-12 DIAGNOSIS — I110 Hypertensive heart disease with heart failure: Secondary | ICD-10-CM | POA: Diagnosis not present

## 2023-10-12 DIAGNOSIS — I082 Rheumatic disorders of both aortic and tricuspid valves: Secondary | ICD-10-CM | POA: Diagnosis not present

## 2023-10-12 DIAGNOSIS — Z8673 Personal history of transient ischemic attack (TIA), and cerebral infarction without residual deficits: Secondary | ICD-10-CM | POA: Diagnosis not present

## 2023-10-12 DIAGNOSIS — E7800 Pure hypercholesterolemia, unspecified: Secondary | ICD-10-CM | POA: Diagnosis not present

## 2023-10-12 DIAGNOSIS — F419 Anxiety disorder, unspecified: Secondary | ICD-10-CM | POA: Diagnosis not present

## 2023-10-12 DIAGNOSIS — R569 Unspecified convulsions: Secondary | ICD-10-CM | POA: Diagnosis not present

## 2023-10-12 DIAGNOSIS — E039 Hypothyroidism, unspecified: Secondary | ICD-10-CM | POA: Diagnosis not present

## 2023-10-12 DIAGNOSIS — Z7902 Long term (current) use of antithrombotics/antiplatelets: Secondary | ICD-10-CM | POA: Diagnosis not present

## 2023-10-12 DIAGNOSIS — I4891 Unspecified atrial fibrillation: Secondary | ICD-10-CM | POA: Diagnosis not present

## 2023-10-12 DIAGNOSIS — K219 Gastro-esophageal reflux disease without esophagitis: Secondary | ICD-10-CM | POA: Diagnosis not present

## 2023-10-14 DIAGNOSIS — E039 Hypothyroidism, unspecified: Secondary | ICD-10-CM | POA: Diagnosis not present

## 2023-10-14 DIAGNOSIS — R569 Unspecified convulsions: Secondary | ICD-10-CM | POA: Diagnosis not present

## 2023-10-14 DIAGNOSIS — R41841 Cognitive communication deficit: Secondary | ICD-10-CM | POA: Diagnosis not present

## 2023-10-14 DIAGNOSIS — I4891 Unspecified atrial fibrillation: Secondary | ICD-10-CM | POA: Diagnosis not present

## 2023-10-14 DIAGNOSIS — E7800 Pure hypercholesterolemia, unspecified: Secondary | ICD-10-CM | POA: Diagnosis not present

## 2023-10-14 DIAGNOSIS — Z9181 History of falling: Secondary | ICD-10-CM | POA: Diagnosis not present

## 2023-10-14 DIAGNOSIS — I082 Rheumatic disorders of both aortic and tricuspid valves: Secondary | ICD-10-CM | POA: Diagnosis not present

## 2023-10-14 DIAGNOSIS — F419 Anxiety disorder, unspecified: Secondary | ICD-10-CM | POA: Diagnosis not present

## 2023-10-14 DIAGNOSIS — I5032 Chronic diastolic (congestive) heart failure: Secondary | ICD-10-CM | POA: Diagnosis not present

## 2023-10-14 DIAGNOSIS — Z8673 Personal history of transient ischemic attack (TIA), and cerebral infarction without residual deficits: Secondary | ICD-10-CM | POA: Diagnosis not present

## 2023-10-14 DIAGNOSIS — I110 Hypertensive heart disease with heart failure: Secondary | ICD-10-CM | POA: Diagnosis not present

## 2023-10-14 DIAGNOSIS — Z7902 Long term (current) use of antithrombotics/antiplatelets: Secondary | ICD-10-CM | POA: Diagnosis not present

## 2023-10-14 DIAGNOSIS — M791 Myalgia, unspecified site: Secondary | ICD-10-CM | POA: Diagnosis not present

## 2023-10-14 DIAGNOSIS — K219 Gastro-esophageal reflux disease without esophagitis: Secondary | ICD-10-CM | POA: Diagnosis not present

## 2023-10-14 DIAGNOSIS — M1990 Unspecified osteoarthritis, unspecified site: Secondary | ICD-10-CM | POA: Diagnosis not present

## 2023-10-20 DIAGNOSIS — K219 Gastro-esophageal reflux disease without esophagitis: Secondary | ICD-10-CM | POA: Diagnosis not present

## 2023-10-20 DIAGNOSIS — E039 Hypothyroidism, unspecified: Secondary | ICD-10-CM | POA: Diagnosis not present

## 2023-10-20 DIAGNOSIS — Z7902 Long term (current) use of antithrombotics/antiplatelets: Secondary | ICD-10-CM | POA: Diagnosis not present

## 2023-10-20 DIAGNOSIS — I5032 Chronic diastolic (congestive) heart failure: Secondary | ICD-10-CM | POA: Diagnosis not present

## 2023-10-20 DIAGNOSIS — M791 Myalgia, unspecified site: Secondary | ICD-10-CM | POA: Diagnosis not present

## 2023-10-20 DIAGNOSIS — I110 Hypertensive heart disease with heart failure: Secondary | ICD-10-CM | POA: Diagnosis not present

## 2023-10-20 DIAGNOSIS — F419 Anxiety disorder, unspecified: Secondary | ICD-10-CM | POA: Diagnosis not present

## 2023-10-20 DIAGNOSIS — M1990 Unspecified osteoarthritis, unspecified site: Secondary | ICD-10-CM | POA: Diagnosis not present

## 2023-10-20 DIAGNOSIS — R41841 Cognitive communication deficit: Secondary | ICD-10-CM | POA: Diagnosis not present

## 2023-10-20 DIAGNOSIS — I082 Rheumatic disorders of both aortic and tricuspid valves: Secondary | ICD-10-CM | POA: Diagnosis not present

## 2023-10-20 DIAGNOSIS — I4891 Unspecified atrial fibrillation: Secondary | ICD-10-CM | POA: Diagnosis not present

## 2023-10-20 DIAGNOSIS — E7800 Pure hypercholesterolemia, unspecified: Secondary | ICD-10-CM | POA: Diagnosis not present

## 2023-10-20 DIAGNOSIS — Z8673 Personal history of transient ischemic attack (TIA), and cerebral infarction without residual deficits: Secondary | ICD-10-CM | POA: Diagnosis not present

## 2023-10-20 DIAGNOSIS — Z9181 History of falling: Secondary | ICD-10-CM | POA: Diagnosis not present

## 2023-10-20 DIAGNOSIS — R569 Unspecified convulsions: Secondary | ICD-10-CM | POA: Diagnosis not present

## 2023-10-27 DIAGNOSIS — I110 Hypertensive heart disease with heart failure: Secondary | ICD-10-CM | POA: Diagnosis not present

## 2023-10-27 DIAGNOSIS — Z7902 Long term (current) use of antithrombotics/antiplatelets: Secondary | ICD-10-CM | POA: Diagnosis not present

## 2023-10-27 DIAGNOSIS — I5032 Chronic diastolic (congestive) heart failure: Secondary | ICD-10-CM | POA: Diagnosis not present

## 2023-10-27 DIAGNOSIS — K219 Gastro-esophageal reflux disease without esophagitis: Secondary | ICD-10-CM | POA: Diagnosis not present

## 2023-10-27 DIAGNOSIS — Z8673 Personal history of transient ischemic attack (TIA), and cerebral infarction without residual deficits: Secondary | ICD-10-CM | POA: Diagnosis not present

## 2023-10-27 DIAGNOSIS — E7800 Pure hypercholesterolemia, unspecified: Secondary | ICD-10-CM | POA: Diagnosis not present

## 2023-10-27 DIAGNOSIS — F419 Anxiety disorder, unspecified: Secondary | ICD-10-CM | POA: Diagnosis not present

## 2023-10-27 DIAGNOSIS — I082 Rheumatic disorders of both aortic and tricuspid valves: Secondary | ICD-10-CM | POA: Diagnosis not present

## 2023-10-27 DIAGNOSIS — M1990 Unspecified osteoarthritis, unspecified site: Secondary | ICD-10-CM | POA: Diagnosis not present

## 2023-10-27 DIAGNOSIS — E039 Hypothyroidism, unspecified: Secondary | ICD-10-CM | POA: Diagnosis not present

## 2023-10-27 DIAGNOSIS — I4891 Unspecified atrial fibrillation: Secondary | ICD-10-CM | POA: Diagnosis not present

## 2023-10-27 DIAGNOSIS — M791 Myalgia, unspecified site: Secondary | ICD-10-CM | POA: Diagnosis not present

## 2023-10-27 DIAGNOSIS — R569 Unspecified convulsions: Secondary | ICD-10-CM | POA: Diagnosis not present

## 2023-10-27 DIAGNOSIS — R41841 Cognitive communication deficit: Secondary | ICD-10-CM | POA: Diagnosis not present

## 2023-10-27 DIAGNOSIS — Z9181 History of falling: Secondary | ICD-10-CM | POA: Diagnosis not present

## 2023-11-03 DIAGNOSIS — I110 Hypertensive heart disease with heart failure: Secondary | ICD-10-CM | POA: Diagnosis not present

## 2023-11-03 DIAGNOSIS — R569 Unspecified convulsions: Secondary | ICD-10-CM | POA: Diagnosis not present

## 2023-11-03 DIAGNOSIS — Z8673 Personal history of transient ischemic attack (TIA), and cerebral infarction without residual deficits: Secondary | ICD-10-CM | POA: Diagnosis not present

## 2023-11-03 DIAGNOSIS — Z9181 History of falling: Secondary | ICD-10-CM | POA: Diagnosis not present

## 2023-11-03 DIAGNOSIS — Z7902 Long term (current) use of antithrombotics/antiplatelets: Secondary | ICD-10-CM | POA: Diagnosis not present

## 2023-11-03 DIAGNOSIS — M1990 Unspecified osteoarthritis, unspecified site: Secondary | ICD-10-CM | POA: Diagnosis not present

## 2023-11-03 DIAGNOSIS — I082 Rheumatic disorders of both aortic and tricuspid valves: Secondary | ICD-10-CM | POA: Diagnosis not present

## 2023-11-03 DIAGNOSIS — E7800 Pure hypercholesterolemia, unspecified: Secondary | ICD-10-CM | POA: Diagnosis not present

## 2023-11-03 DIAGNOSIS — I5032 Chronic diastolic (congestive) heart failure: Secondary | ICD-10-CM | POA: Diagnosis not present

## 2023-11-03 DIAGNOSIS — K219 Gastro-esophageal reflux disease without esophagitis: Secondary | ICD-10-CM | POA: Diagnosis not present

## 2023-11-03 DIAGNOSIS — I4891 Unspecified atrial fibrillation: Secondary | ICD-10-CM | POA: Diagnosis not present

## 2023-11-03 DIAGNOSIS — R41841 Cognitive communication deficit: Secondary | ICD-10-CM | POA: Diagnosis not present

## 2023-11-03 DIAGNOSIS — E039 Hypothyroidism, unspecified: Secondary | ICD-10-CM | POA: Diagnosis not present

## 2023-11-03 DIAGNOSIS — F419 Anxiety disorder, unspecified: Secondary | ICD-10-CM | POA: Diagnosis not present

## 2023-11-03 DIAGNOSIS — M791 Myalgia, unspecified site: Secondary | ICD-10-CM | POA: Diagnosis not present

## 2023-11-08 ENCOUNTER — Ambulatory Visit: Payer: Medicare Other | Admitting: Podiatry

## 2023-11-09 DIAGNOSIS — I4891 Unspecified atrial fibrillation: Secondary | ICD-10-CM | POA: Diagnosis not present

## 2023-11-09 DIAGNOSIS — E7800 Pure hypercholesterolemia, unspecified: Secondary | ICD-10-CM | POA: Diagnosis not present

## 2023-11-09 DIAGNOSIS — M1990 Unspecified osteoarthritis, unspecified site: Secondary | ICD-10-CM | POA: Diagnosis not present

## 2023-11-09 DIAGNOSIS — F419 Anxiety disorder, unspecified: Secondary | ICD-10-CM | POA: Diagnosis not present

## 2023-11-09 DIAGNOSIS — I110 Hypertensive heart disease with heart failure: Secondary | ICD-10-CM | POA: Diagnosis not present

## 2023-11-09 DIAGNOSIS — R569 Unspecified convulsions: Secondary | ICD-10-CM | POA: Diagnosis not present

## 2023-11-09 DIAGNOSIS — K219 Gastro-esophageal reflux disease without esophagitis: Secondary | ICD-10-CM | POA: Diagnosis not present

## 2023-11-09 DIAGNOSIS — Z9181 History of falling: Secondary | ICD-10-CM | POA: Diagnosis not present

## 2023-11-09 DIAGNOSIS — E039 Hypothyroidism, unspecified: Secondary | ICD-10-CM | POA: Diagnosis not present

## 2023-11-09 DIAGNOSIS — I082 Rheumatic disorders of both aortic and tricuspid valves: Secondary | ICD-10-CM | POA: Diagnosis not present

## 2023-11-09 DIAGNOSIS — Z7902 Long term (current) use of antithrombotics/antiplatelets: Secondary | ICD-10-CM | POA: Diagnosis not present

## 2023-11-09 DIAGNOSIS — Z8673 Personal history of transient ischemic attack (TIA), and cerebral infarction without residual deficits: Secondary | ICD-10-CM | POA: Diagnosis not present

## 2023-11-09 DIAGNOSIS — M791 Myalgia, unspecified site: Secondary | ICD-10-CM | POA: Diagnosis not present

## 2023-11-09 DIAGNOSIS — R41841 Cognitive communication deficit: Secondary | ICD-10-CM | POA: Diagnosis not present

## 2023-11-09 DIAGNOSIS — I5032 Chronic diastolic (congestive) heart failure: Secondary | ICD-10-CM | POA: Diagnosis not present

## 2023-11-20 DIAGNOSIS — K080 Exfoliation of teeth due to systemic causes: Secondary | ICD-10-CM | POA: Diagnosis not present

## 2023-11-23 ENCOUNTER — Ambulatory Visit (INDEPENDENT_AMBULATORY_CARE_PROVIDER_SITE_OTHER): Payer: Medicare Other | Admitting: Podiatry

## 2023-11-23 DIAGNOSIS — B351 Tinea unguium: Secondary | ICD-10-CM | POA: Diagnosis not present

## 2023-11-23 DIAGNOSIS — M79675 Pain in left toe(s): Secondary | ICD-10-CM

## 2023-11-23 DIAGNOSIS — M79674 Pain in right toe(s): Secondary | ICD-10-CM | POA: Diagnosis not present

## 2023-11-23 DIAGNOSIS — M810 Age-related osteoporosis without current pathological fracture: Secondary | ICD-10-CM | POA: Diagnosis not present

## 2023-11-23 DIAGNOSIS — R29898 Other symptoms and signs involving the musculoskeletal system: Secondary | ICD-10-CM | POA: Diagnosis not present

## 2023-11-23 DIAGNOSIS — M1711 Unilateral primary osteoarthritis, right knee: Secondary | ICD-10-CM | POA: Diagnosis not present

## 2023-11-23 DIAGNOSIS — L853 Xerosis cutis: Secondary | ICD-10-CM

## 2023-11-23 NOTE — Progress Notes (Signed)
    Subjective:  Patient ID: Jean Ruiz, female    DOB: 10-06-1935,  MRN: [id number]  Jean Ruiz presents to clinic today for:  Chief Complaint  Patient presents with   Nail Problem    LFT GREAT NAIL FUNGAL FU PLAVIX   Patient notes nails are thick, discolored, elongated and painful in shoegear when trying to ambulate.  She is requesting recommendations of creams for dry skin on her legs and feet.  PCP is Paulina Fusi, MD.  Past Medical History:  Diagnosis Date   Atrial fibrillation [facility])    CHF (congestive heart failure) (HCC)    Hyperlipidemia    Hypertension    Stroke [facility])    TIA    Past Surgical History:  Procedure Laterality Date   ABDOMINAL HYSTERECTOMY     BLADDER SURGERY     EYE SURGERY     THYROID SURGERY      Allergies  Allergen Reactions   Ceclor [Cefaclor] Rash   Elemental Sulfur Rash   Review of Systems: Negative except as noted in the HPI.  Objective:  Jean Ruiz is a pleasant 88 y.o. female in NAD. AAO x 3.  Vascular Examination: Capillary refill time is 3-5 seconds to toes bilateral. Palpable pedal pulses b/l LE. Digital hair sparse b/l.  Skin temperature gradient WNL b/l.   Dermatological Examination: Pedal skin with decreased turgor, texture and tone b/l. No open wounds. No interdigital macerations b/l. Toenails x10 are 3mm thick, discolored, dystrophic with subungual debris. There is pain with compression of the nail plates.  They are elongated x10.  The skin is dry to the legs and feet, but not excessively scaly.  Assessment/Plan: 1. Pain due to onychomycosis of toenails of both feet   2. Xerosis of skin     The mycotic toenails were sharply debrided x10 with sterile nail nippers and a power debriding burr to decrease bulk/thickness and length.    The following creams were recommended for the patient to try for overall dry skin: Eucerin cream, Lubriderm cream, or Revitaderm cream that is supplied in our office.  She  was encouraged to apply this within 30 minutes of getting out of the shower and toweling off.  Return in about 3 months (around 02/21/2024) for fungal nail recheck.   Clerance Lav, DPM, FACFAS [facility]     [address] [place], [place] [address]                Office [phone]  Fax [phone]

## 2023-11-30 DIAGNOSIS — Z23 Encounter for immunization: Secondary | ICD-10-CM | POA: Diagnosis not present

## 2023-11-30 DIAGNOSIS — M810 Age-related osteoporosis without current pathological fracture: Secondary | ICD-10-CM | POA: Diagnosis not present

## 2023-12-12 DIAGNOSIS — M25561 Pain in right knee: Secondary | ICD-10-CM | POA: Diagnosis not present

## 2023-12-21 IMAGING — MR MR LUMBAR SPINE W/O CM
4 of 5 series · 17 of 48 positions shown · non-contrast
Comparison: Lumbar spine MRI 10/28/2019 trauma thoracic and lumbar
spine radiographs 05/17/2022 lumbar spine CT 02/09/2022

CLINICAL DATA: Osteoporosis with pathologic fracture

EXAM:
MRI LUMBAR SPINE WITHOUT CONTRAST
TECHNIQUE: Multiplanar, multisequence MR imaging of the lumbar spine was
performed. No intravenous contrast was administered.

[Series 2: T2 · sagittal · 4.0mm · 0.55mm/px · 7 of 17 slices shown (1 of 2)]
[im 1/17]
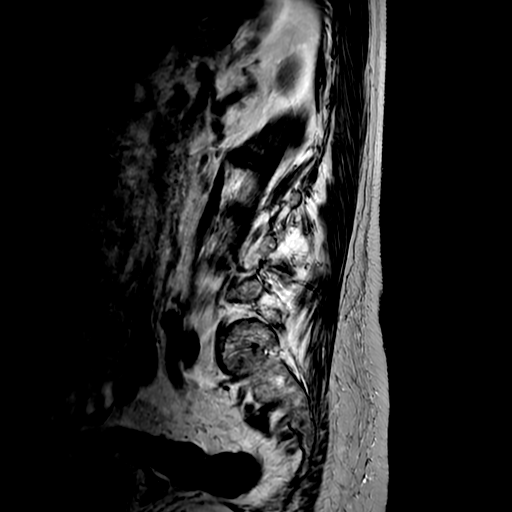
[im 3/17]
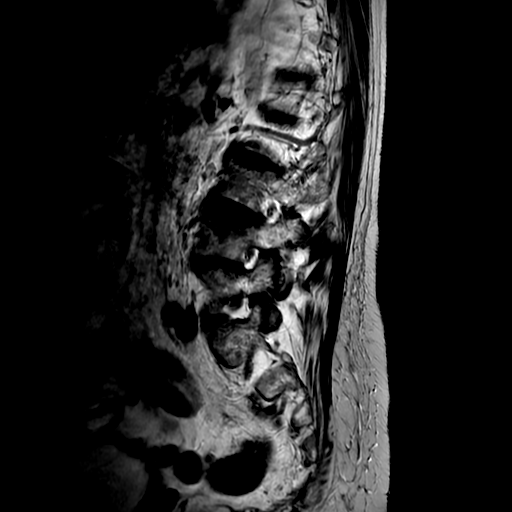
[im 6/17]
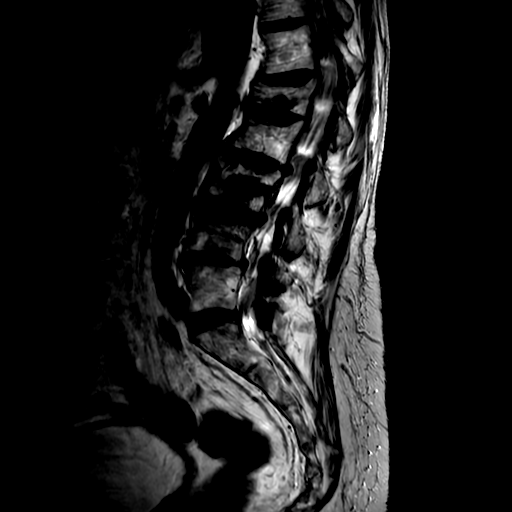
[im 9/17]
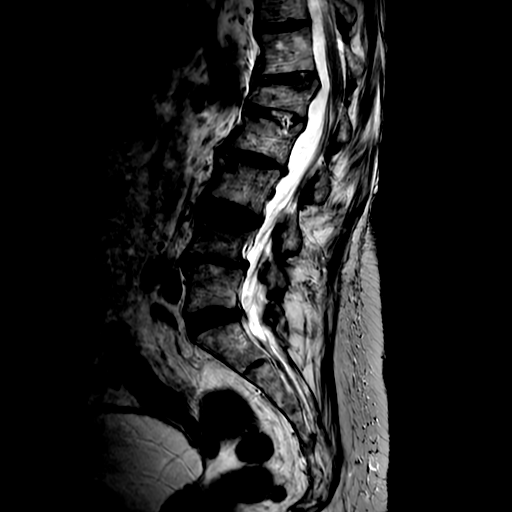
[im 11/17]
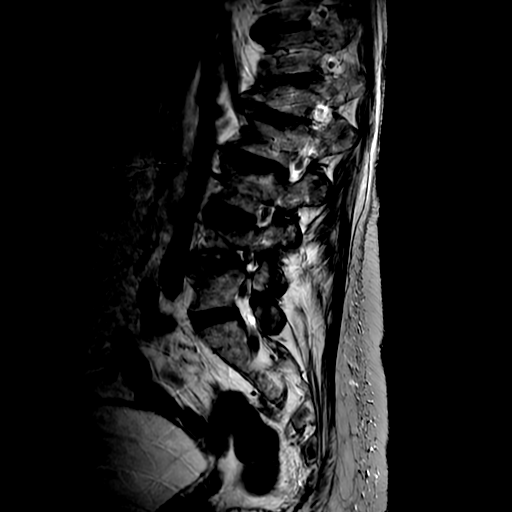
[im 14/17]
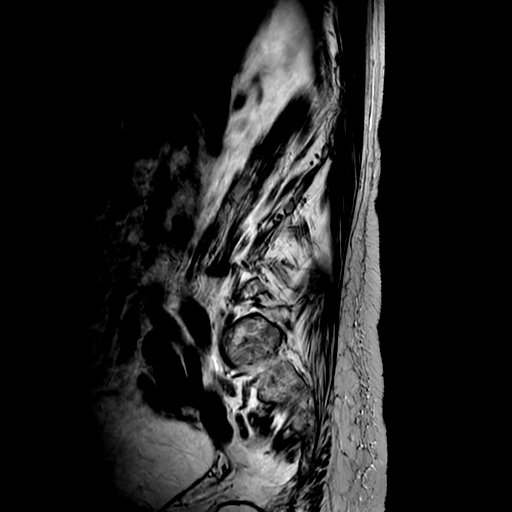
[im 17/17]
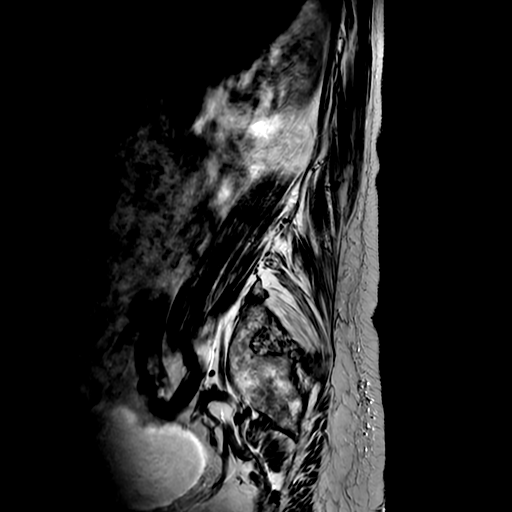

[Series 4: T1 · sagittal · 4.0mm · 0.55mm/px · 3 of 17 slices shown (1 of 2)]
[im 4/17]
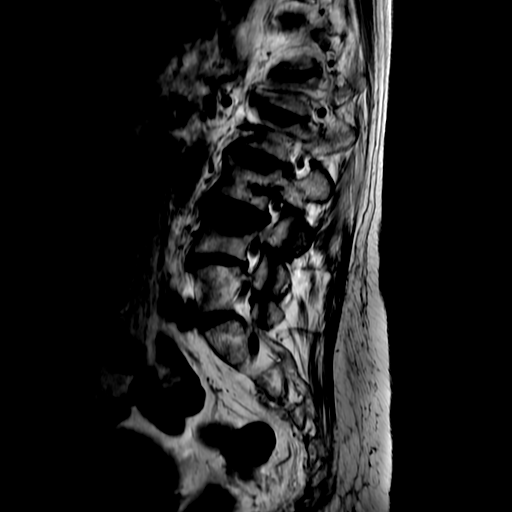
[im 10/17]
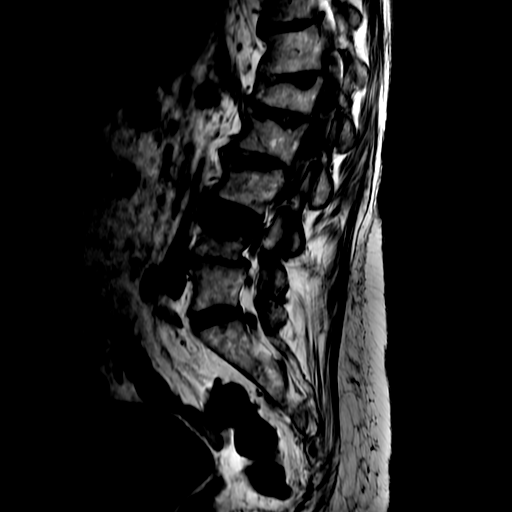
[im 17/17]
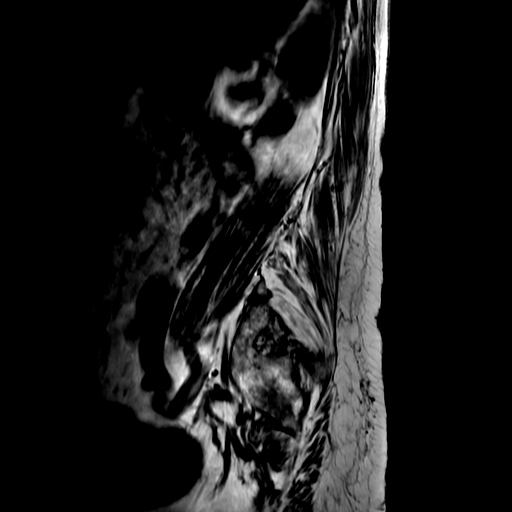

[Series 5: T2 · axial · 4.0mm · 0.39mm/px · z∈[-24,+117]mm · 4 of 38 slices shown (2 of 2)]
[im 1/38]
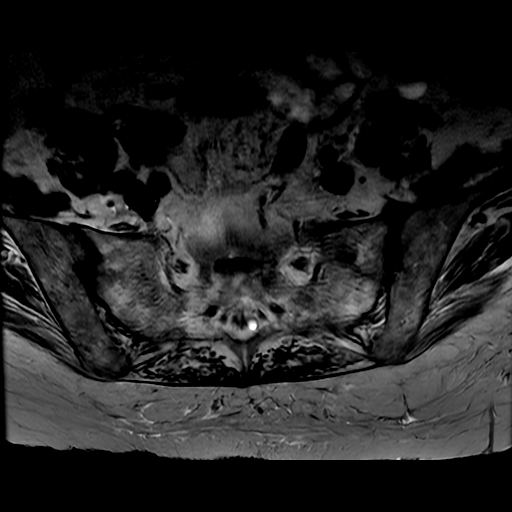
[im 6/38]
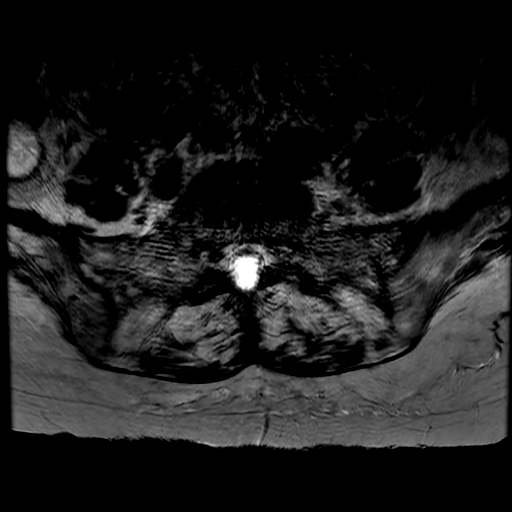
[im 20/38]
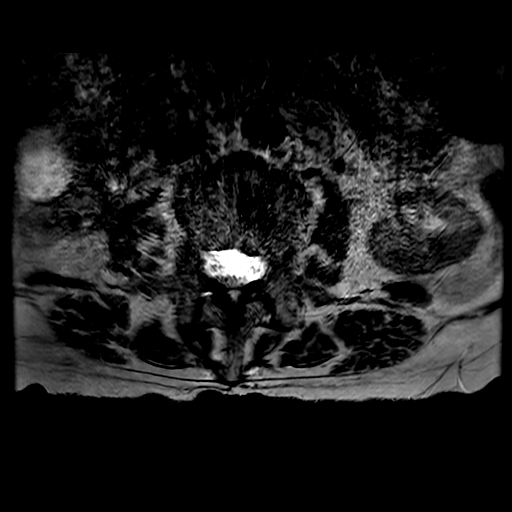
[im 32/38]
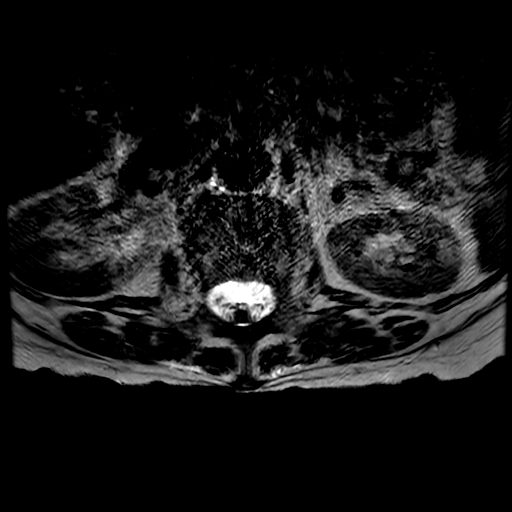

[Series 6: T1 · axial · 4.0mm · 0.39mm/px · z∈[+1,+117]mm · 3 of 38 slices shown (2 of 2)]
[im 6/38]
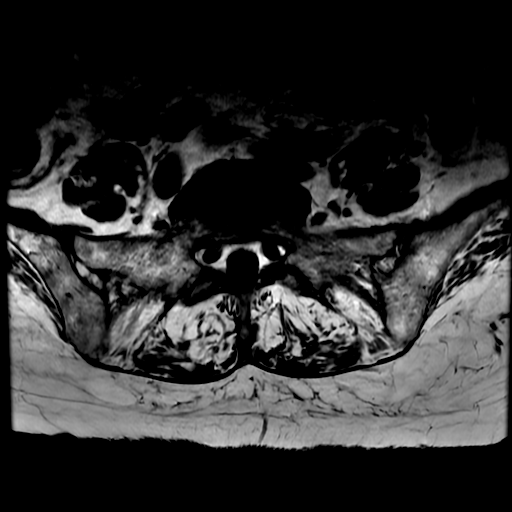
[im 20/38]
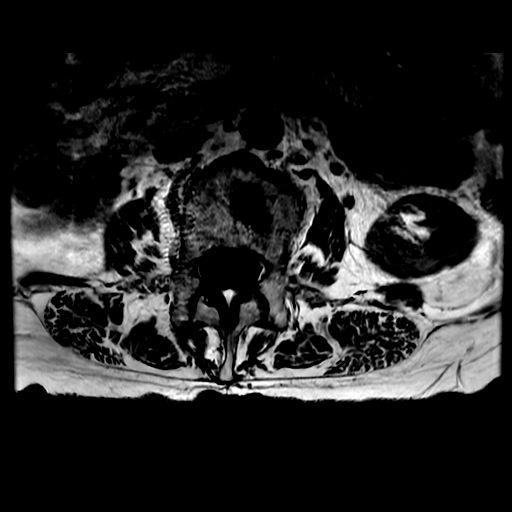
[im 32/38]
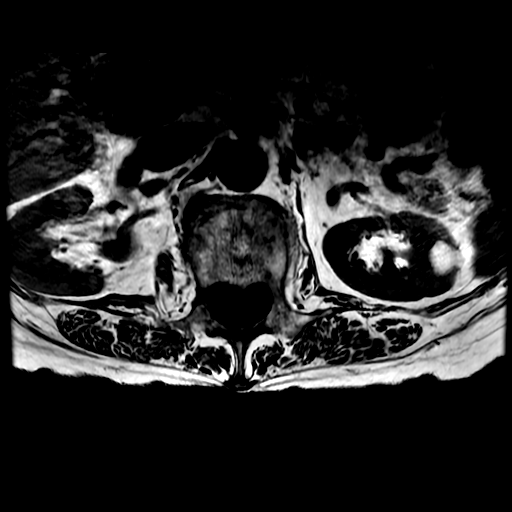

[17 of 48 positions shown; findings below may reference images not displayed]

FINDINGS: Segmentation: Standard; the lowest formed disc space is designated
L5-S1

Alignment: There is focal kyphosis at L1 related to the compression
fracture. There is trace anterolisthesis of L4 on L5. There is no
other antero or retrolisthesis.

Vertebrae: There is an acute to subacute fracture of the L4
vertebral body with up to approximately 25% loss of vertebral body
height centrally eccentric to the right. The height loss may be
slightly progressed compared to the radiographs from 05/16/2022 and
is new since 02/09/2022.

Additional compression fractures of the L1 through L3 vertebral
bodies with vertebral augmentation at L1 and L3 are noted, similar
to the prior radiographs. Mild bony retropulsion at L1 is unchanged
since the MRI from 3333. There is no other evidence of acute
fracture.

Background marrow signal is normal. There is no suspicious marrow
signal abnormality.

Conus medullaris and cauda equina: Conus extends to the L1-L2 level.
Conus and cauda equina appear normal.

Paraspinal and other soft tissues: A T2 hyperintense lesion in the
left kidney likely reflects a cyst, for which no specific imaging
follow-up is required. The paraspinal soft tissues are unremarkable.

Disc levels:

There is multilevel disc desiccation and narrowing, most advanced at
L2-L3 and L4-L5

T12-L1: There is mild retropulsion of the posterior L1 endplate
without significant spinal canal or neural foraminal stenosis.

L1-L2: Disc desiccation and narrowing without significant spinal
canal or neural foraminal stenosis

L2-L3: There is a mild disc bulge and mild facet arthropathy without
significant spinal canal or neural foraminal stenosis.

L3-L4: There is a mild disc bulge eccentric to the left and mild
bilateral facet arthropathy without significant spinal canal or
neural foraminal stenosis.

L4-L5: There is grade 1 anterolisthesis with a mild disc bulge and
bilateral facet arthropathy resulting in mild right worse than left
neural foraminal stenosis without significant spinal canal stenosis.

L5-S1: No significant spinal canal or neural foraminal stenosis.
IMPRESSION: 1. Acute to subacute compression fracture of the L4 vertebral body
with up to approximately 25% loss of vertebral body height centrally
and no bony retropulsion. Height loss has possibly slightly
progressed since the radiographs from 05/16/2022 and is new since
the CT from 02/09/2022.
2. Stable chronic compression fracture of the L1 through L3
vertebral bodies with vertebral augmentation at L1 and L3.
3. Multilevel degenerative changes as above without high-grade
spinal canal or neural foraminal stenosis.

These results will be called to the ordering clinician or
representative by the Radiologist Assistant, and communication
documented in the PACS or [REDACTED].

## 2024-02-01 DIAGNOSIS — H353131 Nonexudative age-related macular degeneration, bilateral, early dry stage: Secondary | ICD-10-CM | POA: Diagnosis not present

## 2024-02-06 DIAGNOSIS — S8012XA Contusion of left lower leg, initial encounter: Secondary | ICD-10-CM | POA: Diagnosis not present

## 2024-02-22 ENCOUNTER — Ambulatory Visit: Payer: Medicare Other | Admitting: Podiatry

## 2024-02-29 ENCOUNTER — Ambulatory Visit: Admitting: Podiatry

## 2024-02-29 DIAGNOSIS — M79674 Pain in right toe(s): Secondary | ICD-10-CM | POA: Diagnosis not present

## 2024-02-29 DIAGNOSIS — B351 Tinea unguium: Secondary | ICD-10-CM

## 2024-02-29 DIAGNOSIS — M79675 Pain in left toe(s): Secondary | ICD-10-CM

## 2024-02-29 NOTE — Progress Notes (Signed)
       Subjective:  Patient ID: Jean Ruiz, female    DOB: 1935-02-04,  MRN: [id number]  Jean Ruiz presents to clinic today for:  Chief Complaint  Patient presents with   Nail Problem    Here for nail fungus follow up. I set her up for rfc. Not diabetic and takes ASA 81 and Plavix   Patient notes nails are thick, discolored, elongated and painful in shoegear when trying to ambulate.  Patient noticed some blood on her sheets this morning in bed.  She inspected her right foot and noticed some blood present near the distal portion of the right great toenail.  PCP is Paulina Fusi, MD.  Past Medical History:  Diagnosis Date   Atrial fibrillation [facility])    CHF (congestive heart failure) (HCC)    Hyperlipidemia    Hypertension    Stroke [facility])    TIA    Past Surgical History:  Procedure Laterality Date   ABDOMINAL HYSTERECTOMY     BLADDER SURGERY     EYE SURGERY     THYROID SURGERY      Allergies  Allergen Reactions   Ceclor [Cefaclor] Rash   Elemental Sulfur Rash    Review of Systems: Negative except as noted in the HPI.  Objective:  JAMAL HASKIN is a pleasant 89 y.o. female in NAD. AAO x 3.  Vascular Examination: Capillary refill time is 3-5 seconds to toes bilateral. Palpable pedal pulses b/l LE. Digital hair present b/l.  Skin temperature gradient WNL b/l. No varicosities b/l. No cyanosis noted b/l.   Dermatological Examination: Pedal skin with normal turgor, texture and tone b/l. No open wounds. No interdigital macerations b/l. Toenails x10 are 3mm thick, discolored, dystrophic with subungual debris. There is pain with compression of the nail plates.  They are elongated x10.  Dried blood is present at the distal lateral border of the right great toenail.  No laceration or lifting of the nail is noted.  No surrounding erythema is noted  Assessment/Plan: 1. Pain due to onychomycosis of toenails of both feet     The mycotic toenails were sharply  debrided x10 with sterile nail nippers and a power debriding burr to decrease bulk/thickness and length.    Informed the patient that her sheets may have caught on the toenail or she could have struck the other toe and push the skin into the nail.  At this point everything looks good.  The area was cleansed and antibiotic ointment was applied to the area.  She is to call if any clinical signs of infection develop.    Return in about 3 months (around 05/31/2024) for RFC.   Clerance Lav, DPM, FACFAS [facility]     [address] [place], [place] [address]                Office [phone]  Fax [phone]

## 2024-03-20 DIAGNOSIS — E890 Postprocedural hypothyroidism: Secondary | ICD-10-CM | POA: Diagnosis not present

## 2024-03-20 DIAGNOSIS — I4891 Unspecified atrial fibrillation: Secondary | ICD-10-CM | POA: Diagnosis not present

## 2024-03-20 DIAGNOSIS — I6789 Other cerebrovascular disease: Secondary | ICD-10-CM | POA: Diagnosis not present

## 2024-03-20 DIAGNOSIS — G8191 Hemiplegia, unspecified affecting right dominant side: Secondary | ICD-10-CM | POA: Diagnosis not present

## 2024-03-20 DIAGNOSIS — E785 Hyperlipidemia, unspecified: Secondary | ICD-10-CM | POA: Diagnosis not present

## 2024-03-20 DIAGNOSIS — D519 Vitamin B12 deficiency anemia, unspecified: Secondary | ICD-10-CM | POA: Diagnosis not present

## 2024-03-28 DIAGNOSIS — Z882 Allergy status to sulfonamides status: Secondary | ICD-10-CM | POA: Diagnosis not present

## 2024-03-28 DIAGNOSIS — E876 Hypokalemia: Secondary | ICD-10-CM | POA: Diagnosis not present

## 2024-03-28 DIAGNOSIS — Z8673 Personal history of transient ischemic attack (TIA), and cerebral infarction without residual deficits: Secondary | ICD-10-CM | POA: Diagnosis not present

## 2024-03-28 DIAGNOSIS — Q2112 Patent foramen ovale: Secondary | ICD-10-CM | POA: Diagnosis not present

## 2024-03-28 DIAGNOSIS — M1990 Unspecified osteoarthritis, unspecified site: Secondary | ICD-10-CM | POA: Diagnosis not present

## 2024-03-28 DIAGNOSIS — R531 Weakness: Secondary | ICD-10-CM | POA: Diagnosis not present

## 2024-03-28 DIAGNOSIS — Z7982 Long term (current) use of aspirin: Secondary | ICD-10-CM | POA: Diagnosis not present

## 2024-03-28 DIAGNOSIS — Z87440 Personal history of urinary (tract) infections: Secondary | ICD-10-CM | POA: Diagnosis not present

## 2024-03-28 DIAGNOSIS — E7800 Pure hypercholesterolemia, unspecified: Secondary | ICD-10-CM | POA: Diagnosis not present

## 2024-03-28 DIAGNOSIS — E039 Hypothyroidism, unspecified: Secondary | ICD-10-CM | POA: Diagnosis not present

## 2024-03-28 DIAGNOSIS — R079 Chest pain, unspecified: Secondary | ICD-10-CM | POA: Diagnosis not present

## 2024-03-28 DIAGNOSIS — F419 Anxiety disorder, unspecified: Secondary | ICD-10-CM | POA: Diagnosis not present

## 2024-03-28 DIAGNOSIS — I509 Heart failure, unspecified: Secondary | ICD-10-CM | POA: Diagnosis not present

## 2024-03-28 DIAGNOSIS — Z8719 Personal history of other diseases of the digestive system: Secondary | ICD-10-CM | POA: Diagnosis not present

## 2024-03-28 DIAGNOSIS — I110 Hypertensive heart disease with heart failure: Secondary | ICD-10-CM | POA: Diagnosis not present

## 2024-03-28 DIAGNOSIS — Z888 Allergy status to other drugs, medicaments and biological substances status: Secondary | ICD-10-CM | POA: Diagnosis not present

## 2024-03-28 DIAGNOSIS — Z79899 Other long term (current) drug therapy: Secondary | ICD-10-CM | POA: Diagnosis not present

## 2024-03-28 DIAGNOSIS — I482 Chronic atrial fibrillation, unspecified: Secondary | ICD-10-CM | POA: Diagnosis not present

## 2024-03-28 DIAGNOSIS — Z7902 Long term (current) use of antithrombotics/antiplatelets: Secondary | ICD-10-CM | POA: Diagnosis not present

## 2024-03-29 DIAGNOSIS — I361 Nonrheumatic tricuspid (valve) insufficiency: Secondary | ICD-10-CM

## 2024-03-29 DIAGNOSIS — E876 Hypokalemia: Secondary | ICD-10-CM | POA: Diagnosis not present

## 2024-03-29 DIAGNOSIS — I4891 Unspecified atrial fibrillation: Secondary | ICD-10-CM | POA: Diagnosis not present

## 2024-03-29 DIAGNOSIS — I351 Nonrheumatic aortic (valve) insufficiency: Secondary | ICD-10-CM | POA: Diagnosis not present

## 2024-03-29 DIAGNOSIS — R531 Weakness: Secondary | ICD-10-CM | POA: Diagnosis not present

## 2024-03-29 DIAGNOSIS — R079 Chest pain, unspecified: Secondary | ICD-10-CM | POA: Diagnosis not present

## 2024-03-30 DIAGNOSIS — I4891 Unspecified atrial fibrillation: Secondary | ICD-10-CM | POA: Diagnosis not present

## 2024-03-30 DIAGNOSIS — E039 Hypothyroidism, unspecified: Secondary | ICD-10-CM | POA: Diagnosis not present

## 2024-03-30 DIAGNOSIS — Z7902 Long term (current) use of antithrombotics/antiplatelets: Secondary | ICD-10-CM | POA: Diagnosis not present

## 2024-03-30 DIAGNOSIS — F419 Anxiety disorder, unspecified: Secondary | ICD-10-CM | POA: Diagnosis not present

## 2024-03-30 DIAGNOSIS — Z7982 Long term (current) use of aspirin: Secondary | ICD-10-CM | POA: Diagnosis not present

## 2024-03-30 DIAGNOSIS — I509 Heart failure, unspecified: Secondary | ICD-10-CM | POA: Diagnosis not present

## 2024-03-30 DIAGNOSIS — E876 Hypokalemia: Secondary | ICD-10-CM | POA: Diagnosis not present

## 2024-03-30 DIAGNOSIS — I110 Hypertensive heart disease with heart failure: Secondary | ICD-10-CM | POA: Diagnosis not present

## 2024-04-04 DIAGNOSIS — E876 Hypokalemia: Secondary | ICD-10-CM | POA: Diagnosis not present

## 2024-04-04 DIAGNOSIS — Z9189 Other specified personal risk factors, not elsewhere classified: Secondary | ICD-10-CM | POA: Diagnosis not present

## 2024-04-04 DIAGNOSIS — G8929 Other chronic pain: Secondary | ICD-10-CM | POA: Diagnosis not present

## 2024-04-04 DIAGNOSIS — R079 Chest pain, unspecified: Secondary | ICD-10-CM | POA: Diagnosis not present

## 2024-04-05 DIAGNOSIS — I110 Hypertensive heart disease with heart failure: Secondary | ICD-10-CM | POA: Diagnosis not present

## 2024-04-05 DIAGNOSIS — I4891 Unspecified atrial fibrillation: Secondary | ICD-10-CM | POA: Diagnosis not present

## 2024-04-05 DIAGNOSIS — Z7902 Long term (current) use of antithrombotics/antiplatelets: Secondary | ICD-10-CM | POA: Diagnosis not present

## 2024-04-05 DIAGNOSIS — F419 Anxiety disorder, unspecified: Secondary | ICD-10-CM | POA: Diagnosis not present

## 2024-04-05 DIAGNOSIS — E039 Hypothyroidism, unspecified: Secondary | ICD-10-CM | POA: Diagnosis not present

## 2024-04-05 DIAGNOSIS — I509 Heart failure, unspecified: Secondary | ICD-10-CM | POA: Diagnosis not present

## 2024-04-05 DIAGNOSIS — E876 Hypokalemia: Secondary | ICD-10-CM | POA: Diagnosis not present

## 2024-04-05 DIAGNOSIS — Z7982 Long term (current) use of aspirin: Secondary | ICD-10-CM | POA: Diagnosis not present

## 2024-04-12 DIAGNOSIS — E876 Hypokalemia: Secondary | ICD-10-CM | POA: Diagnosis not present

## 2024-04-12 DIAGNOSIS — I509 Heart failure, unspecified: Secondary | ICD-10-CM | POA: Diagnosis not present

## 2024-04-12 DIAGNOSIS — I4891 Unspecified atrial fibrillation: Secondary | ICD-10-CM | POA: Diagnosis not present

## 2024-04-12 DIAGNOSIS — Z7902 Long term (current) use of antithrombotics/antiplatelets: Secondary | ICD-10-CM | POA: Diagnosis not present

## 2024-04-12 DIAGNOSIS — E039 Hypothyroidism, unspecified: Secondary | ICD-10-CM | POA: Diagnosis not present

## 2024-04-12 DIAGNOSIS — Z7982 Long term (current) use of aspirin: Secondary | ICD-10-CM | POA: Diagnosis not present

## 2024-04-12 DIAGNOSIS — F419 Anxiety disorder, unspecified: Secondary | ICD-10-CM | POA: Diagnosis not present

## 2024-04-12 DIAGNOSIS — I110 Hypertensive heart disease with heart failure: Secondary | ICD-10-CM | POA: Diagnosis not present

## 2024-04-19 DIAGNOSIS — I509 Heart failure, unspecified: Secondary | ICD-10-CM | POA: Diagnosis not present

## 2024-04-19 DIAGNOSIS — E876 Hypokalemia: Secondary | ICD-10-CM | POA: Diagnosis not present

## 2024-04-19 DIAGNOSIS — Z7902 Long term (current) use of antithrombotics/antiplatelets: Secondary | ICD-10-CM | POA: Diagnosis not present

## 2024-04-19 DIAGNOSIS — E039 Hypothyroidism, unspecified: Secondary | ICD-10-CM | POA: Diagnosis not present

## 2024-04-19 DIAGNOSIS — I110 Hypertensive heart disease with heart failure: Secondary | ICD-10-CM | POA: Diagnosis not present

## 2024-04-19 DIAGNOSIS — F419 Anxiety disorder, unspecified: Secondary | ICD-10-CM | POA: Diagnosis not present

## 2024-04-19 DIAGNOSIS — Z7982 Long term (current) use of aspirin: Secondary | ICD-10-CM | POA: Diagnosis not present

## 2024-04-19 DIAGNOSIS — I4891 Unspecified atrial fibrillation: Secondary | ICD-10-CM | POA: Diagnosis not present

## 2024-04-25 DIAGNOSIS — D519 Vitamin B12 deficiency anemia, unspecified: Secondary | ICD-10-CM | POA: Diagnosis not present

## 2024-04-29 DIAGNOSIS — Z7982 Long term (current) use of aspirin: Secondary | ICD-10-CM | POA: Diagnosis not present

## 2024-04-29 DIAGNOSIS — E876 Hypokalemia: Secondary | ICD-10-CM | POA: Diagnosis not present

## 2024-04-29 DIAGNOSIS — I110 Hypertensive heart disease with heart failure: Secondary | ICD-10-CM | POA: Diagnosis not present

## 2024-04-29 DIAGNOSIS — F419 Anxiety disorder, unspecified: Secondary | ICD-10-CM | POA: Diagnosis not present

## 2024-04-29 DIAGNOSIS — Z7902 Long term (current) use of antithrombotics/antiplatelets: Secondary | ICD-10-CM | POA: Diagnosis not present

## 2024-04-29 DIAGNOSIS — E039 Hypothyroidism, unspecified: Secondary | ICD-10-CM | POA: Diagnosis not present

## 2024-04-29 DIAGNOSIS — I4891 Unspecified atrial fibrillation: Secondary | ICD-10-CM | POA: Diagnosis not present

## 2024-04-29 DIAGNOSIS — I509 Heart failure, unspecified: Secondary | ICD-10-CM | POA: Diagnosis not present

## 2024-04-30 DIAGNOSIS — I4891 Unspecified atrial fibrillation: Secondary | ICD-10-CM | POA: Diagnosis not present

## 2024-04-30 DIAGNOSIS — E876 Hypokalemia: Secondary | ICD-10-CM | POA: Diagnosis not present

## 2024-04-30 DIAGNOSIS — F419 Anxiety disorder, unspecified: Secondary | ICD-10-CM | POA: Diagnosis not present

## 2024-04-30 DIAGNOSIS — I509 Heart failure, unspecified: Secondary | ICD-10-CM | POA: Diagnosis not present

## 2024-04-30 DIAGNOSIS — Z7902 Long term (current) use of antithrombotics/antiplatelets: Secondary | ICD-10-CM | POA: Diagnosis not present

## 2024-04-30 DIAGNOSIS — I110 Hypertensive heart disease with heart failure: Secondary | ICD-10-CM | POA: Diagnosis not present

## 2024-04-30 DIAGNOSIS — E039 Hypothyroidism, unspecified: Secondary | ICD-10-CM | POA: Diagnosis not present

## 2024-04-30 DIAGNOSIS — Z7982 Long term (current) use of aspirin: Secondary | ICD-10-CM | POA: Diagnosis not present

## 2024-05-03 DIAGNOSIS — S81802A Unspecified open wound, left lower leg, initial encounter: Secondary | ICD-10-CM | POA: Diagnosis not present

## 2024-05-10 DIAGNOSIS — I110 Hypertensive heart disease with heart failure: Secondary | ICD-10-CM | POA: Diagnosis not present

## 2024-05-10 DIAGNOSIS — E876 Hypokalemia: Secondary | ICD-10-CM | POA: Diagnosis not present

## 2024-05-10 DIAGNOSIS — E039 Hypothyroidism, unspecified: Secondary | ICD-10-CM | POA: Diagnosis not present

## 2024-05-10 DIAGNOSIS — I4891 Unspecified atrial fibrillation: Secondary | ICD-10-CM | POA: Diagnosis not present

## 2024-05-10 DIAGNOSIS — Z7902 Long term (current) use of antithrombotics/antiplatelets: Secondary | ICD-10-CM | POA: Diagnosis not present

## 2024-05-10 DIAGNOSIS — I509 Heart failure, unspecified: Secondary | ICD-10-CM | POA: Diagnosis not present

## 2024-05-10 DIAGNOSIS — Z7982 Long term (current) use of aspirin: Secondary | ICD-10-CM | POA: Diagnosis not present

## 2024-05-10 DIAGNOSIS — F419 Anxiety disorder, unspecified: Secondary | ICD-10-CM | POA: Diagnosis not present

## 2024-05-17 DIAGNOSIS — Z7982 Long term (current) use of aspirin: Secondary | ICD-10-CM | POA: Diagnosis not present

## 2024-05-17 DIAGNOSIS — I509 Heart failure, unspecified: Secondary | ICD-10-CM | POA: Diagnosis not present

## 2024-05-17 DIAGNOSIS — E039 Hypothyroidism, unspecified: Secondary | ICD-10-CM | POA: Diagnosis not present

## 2024-05-17 DIAGNOSIS — I110 Hypertensive heart disease with heart failure: Secondary | ICD-10-CM | POA: Diagnosis not present

## 2024-05-17 DIAGNOSIS — Z7902 Long term (current) use of antithrombotics/antiplatelets: Secondary | ICD-10-CM | POA: Diagnosis not present

## 2024-05-17 DIAGNOSIS — I4891 Unspecified atrial fibrillation: Secondary | ICD-10-CM | POA: Diagnosis not present

## 2024-05-17 DIAGNOSIS — E876 Hypokalemia: Secondary | ICD-10-CM | POA: Diagnosis not present

## 2024-05-17 DIAGNOSIS — F419 Anxiety disorder, unspecified: Secondary | ICD-10-CM | POA: Diagnosis not present

## 2024-05-21 DIAGNOSIS — S81801A Unspecified open wound, right lower leg, initial encounter: Secondary | ICD-10-CM | POA: Diagnosis not present

## 2024-05-25 DIAGNOSIS — E039 Hypothyroidism, unspecified: Secondary | ICD-10-CM | POA: Diagnosis not present

## 2024-05-25 DIAGNOSIS — Z7982 Long term (current) use of aspirin: Secondary | ICD-10-CM | POA: Diagnosis not present

## 2024-05-25 DIAGNOSIS — Z7902 Long term (current) use of antithrombotics/antiplatelets: Secondary | ICD-10-CM | POA: Diagnosis not present

## 2024-05-25 DIAGNOSIS — F419 Anxiety disorder, unspecified: Secondary | ICD-10-CM | POA: Diagnosis not present

## 2024-05-25 DIAGNOSIS — I4891 Unspecified atrial fibrillation: Secondary | ICD-10-CM | POA: Diagnosis not present

## 2024-05-25 DIAGNOSIS — I110 Hypertensive heart disease with heart failure: Secondary | ICD-10-CM | POA: Diagnosis not present

## 2024-05-25 DIAGNOSIS — I509 Heart failure, unspecified: Secondary | ICD-10-CM | POA: Diagnosis not present

## 2024-05-25 DIAGNOSIS — E876 Hypokalemia: Secondary | ICD-10-CM | POA: Diagnosis not present

## 2024-05-29 DIAGNOSIS — Z7982 Long term (current) use of aspirin: Secondary | ICD-10-CM | POA: Diagnosis not present

## 2024-05-29 DIAGNOSIS — E039 Hypothyroidism, unspecified: Secondary | ICD-10-CM | POA: Diagnosis not present

## 2024-05-29 DIAGNOSIS — I4891 Unspecified atrial fibrillation: Secondary | ICD-10-CM | POA: Diagnosis not present

## 2024-05-29 DIAGNOSIS — I110 Hypertensive heart disease with heart failure: Secondary | ICD-10-CM | POA: Diagnosis not present

## 2024-05-29 DIAGNOSIS — I509 Heart failure, unspecified: Secondary | ICD-10-CM | POA: Diagnosis not present

## 2024-05-29 DIAGNOSIS — Z7902 Long term (current) use of antithrombotics/antiplatelets: Secondary | ICD-10-CM | POA: Diagnosis not present

## 2024-05-29 DIAGNOSIS — F419 Anxiety disorder, unspecified: Secondary | ICD-10-CM | POA: Diagnosis not present

## 2024-05-31 ENCOUNTER — Ambulatory Visit: Admitting: Podiatry

## 2024-06-06 DIAGNOSIS — Z7982 Long term (current) use of aspirin: Secondary | ICD-10-CM | POA: Diagnosis not present

## 2024-06-06 DIAGNOSIS — I4891 Unspecified atrial fibrillation: Secondary | ICD-10-CM | POA: Diagnosis not present

## 2024-06-06 DIAGNOSIS — I509 Heart failure, unspecified: Secondary | ICD-10-CM | POA: Diagnosis not present

## 2024-06-06 DIAGNOSIS — F419 Anxiety disorder, unspecified: Secondary | ICD-10-CM | POA: Diagnosis not present

## 2024-06-06 DIAGNOSIS — I110 Hypertensive heart disease with heart failure: Secondary | ICD-10-CM | POA: Diagnosis not present

## 2024-06-06 DIAGNOSIS — Z7902 Long term (current) use of antithrombotics/antiplatelets: Secondary | ICD-10-CM | POA: Diagnosis not present

## 2024-06-06 DIAGNOSIS — E039 Hypothyroidism, unspecified: Secondary | ICD-10-CM | POA: Diagnosis not present

## 2024-06-14 DIAGNOSIS — I509 Heart failure, unspecified: Secondary | ICD-10-CM | POA: Diagnosis not present

## 2024-06-14 DIAGNOSIS — I4891 Unspecified atrial fibrillation: Secondary | ICD-10-CM | POA: Diagnosis not present

## 2024-06-14 DIAGNOSIS — Z7982 Long term (current) use of aspirin: Secondary | ICD-10-CM | POA: Diagnosis not present

## 2024-06-14 DIAGNOSIS — F419 Anxiety disorder, unspecified: Secondary | ICD-10-CM | POA: Diagnosis not present

## 2024-06-14 DIAGNOSIS — I110 Hypertensive heart disease with heart failure: Secondary | ICD-10-CM | POA: Diagnosis not present

## 2024-06-14 DIAGNOSIS — Z7902 Long term (current) use of antithrombotics/antiplatelets: Secondary | ICD-10-CM | POA: Diagnosis not present

## 2024-06-14 DIAGNOSIS — E039 Hypothyroidism, unspecified: Secondary | ICD-10-CM | POA: Diagnosis not present

## 2024-06-17 DIAGNOSIS — Z7902 Long term (current) use of antithrombotics/antiplatelets: Secondary | ICD-10-CM | POA: Diagnosis not present

## 2024-06-17 DIAGNOSIS — F419 Anxiety disorder, unspecified: Secondary | ICD-10-CM | POA: Diagnosis not present

## 2024-06-17 DIAGNOSIS — I4891 Unspecified atrial fibrillation: Secondary | ICD-10-CM | POA: Diagnosis not present

## 2024-06-17 DIAGNOSIS — I509 Heart failure, unspecified: Secondary | ICD-10-CM | POA: Diagnosis not present

## 2024-06-17 DIAGNOSIS — E039 Hypothyroidism, unspecified: Secondary | ICD-10-CM | POA: Diagnosis not present

## 2024-06-17 DIAGNOSIS — I110 Hypertensive heart disease with heart failure: Secondary | ICD-10-CM | POA: Diagnosis not present

## 2024-06-17 DIAGNOSIS — Z7982 Long term (current) use of aspirin: Secondary | ICD-10-CM | POA: Diagnosis not present

## 2024-06-21 ENCOUNTER — Ambulatory Visit: Admitting: Podiatry

## 2024-06-21 DIAGNOSIS — M79675 Pain in left toe(s): Secondary | ICD-10-CM | POA: Diagnosis not present

## 2024-06-21 DIAGNOSIS — M79674 Pain in right toe(s): Secondary | ICD-10-CM

## 2024-06-21 DIAGNOSIS — B351 Tinea unguium: Secondary | ICD-10-CM | POA: Diagnosis not present

## 2024-06-21 NOTE — Progress Notes (Signed)
    Subjective:  Patient ID: Jean Ruiz, female    DOB: 1935/02/11,  MRN: [id number]  Jean Ruiz presents to clinic today for:  Chief Complaint  Patient presents with   [facility]    Not diabetic. Takes Plavix and ASA 81 mg. Needs nail care. Denies corns or callus. Reports improvement in nail fungus since coming here regularly. Patient has pink nail polish on nails today.    Patient notes nails are thick, discolored, elongated and painful in shoegear when trying to ambulate.    PCP is Jean Vicenta BRAVO, MD.  Past Medical History:  Diagnosis Date   Atrial fibrillation [facility])    CHF (congestive heart failure) (HCC)    Hyperlipidemia    Hypertension    Stroke [facility])    TIA   Past Surgical History:  Procedure Laterality Date   ABDOMINAL HYSTERECTOMY     BLADDER SURGERY     EYE SURGERY     THYROID  SURGERY     Allergies  Allergen Reactions   Ceclor [Cefaclor] Rash   Elemental Sulfur Rash    Review of Systems: Negative except as noted in the HPI.  Objective:  Jean Ruiz is a pleasant 89 y.o. female in NAD. AAO x 3.  Vascular Examination: Capillary refill time is 3-5 seconds to toes bilateral. Palpable pedal pulses b/l LE. Digital hair present b/l.  Skin temperature gradient WNL b/l. No varicosities b/l. No cyanosis noted b/l.   Dermatological Examination: Pedal skin with normal turgor, texture and tone b/l. No open wounds. No interdigital macerations b/l. Toenails x10 are 3mm thick, discolored, dystrophic with subungual debris. There is pain with compression of the nail plates.  They are elongated x10  Assessment/Plan: 1. Pain due to onychomycosis of toenails of both feet    The mycotic toenails were sharply debrided x10 with sterile nail nippers and a power debriding burr to decrease bulk/thickness and length.    Return in about 3 months (around 09/21/2024) for RFC.   Jean Ruiz, DPM, FACFAS [facility]     [address] [place], [place] [address]                Office [phone]  Fax [phone]

## 2024-06-28 DIAGNOSIS — D519 Vitamin B12 deficiency anemia, unspecified: Secondary | ICD-10-CM | POA: Diagnosis not present

## 2024-09-20 ENCOUNTER — Encounter: Admitting: Podiatry

## 2024-09-22 NOTE — Progress Notes (Signed)
 Patient did not show for her scheduled routine footcare appointment on 09/20/2024.
